# Patient Record
Sex: Female | Born: 1965 | Race: Black or African American | Hispanic: No | Marital: Married | Smoking: Former smoker
Health system: Southern US, Community
[De-identification: ages and names within clinical notes are randomized; demographics above are authoritative.]

## PROBLEM LIST (undated history)

## (undated) DIAGNOSIS — I10 Essential (primary) hypertension: Secondary | ICD-10-CM

---

## 2008-12-27 ENCOUNTER — Emergency Department: Payer: Self-pay | Admitting: Emergency Medicine

## 2009-12-17 ENCOUNTER — Emergency Department: Payer: Self-pay | Admitting: Emergency Medicine

## 2009-12-18 ENCOUNTER — Ambulatory Visit: Payer: Self-pay | Admitting: Unknown Physician Specialty

## 2010-04-05 ENCOUNTER — Emergency Department: Payer: Self-pay | Admitting: Emergency Medicine

## 2010-10-25 ENCOUNTER — Emergency Department: Payer: Self-pay | Admitting: *Deleted

## 2014-05-28 HISTORY — PX: TOOTH EXTRACTION: SUR596

## 2014-08-21 ENCOUNTER — Encounter: Payer: Self-pay | Admitting: Family Medicine

## 2014-08-21 ENCOUNTER — Other Ambulatory Visit (HOSPITAL_COMMUNITY)
Admission: RE | Admit: 2014-08-21 | Discharge: 2014-08-21 | Disposition: A | Discharge status: Home / Self Care | Payer: Commercial Managed Care - PPO | Source: Ambulatory Visit | Attending: Family Medicine | Admitting: Family Medicine

## 2014-08-21 ENCOUNTER — Ambulatory Visit (INDEPENDENT_AMBULATORY_CARE_PROVIDER_SITE_OTHER): Payer: Commercial Managed Care - PPO | Admitting: Family Medicine

## 2014-08-21 VITALS — BP 150/92 | HR 83 | Temp 97.5°F | Ht 65.0 in | Wt 228.5 lb

## 2014-08-21 DIAGNOSIS — Z1151 Encounter for screening for human papillomavirus (HPV): Secondary | ICD-10-CM | POA: Diagnosis present

## 2014-08-21 DIAGNOSIS — R030 Elevated blood-pressure reading, without diagnosis of hypertension: Secondary | ICD-10-CM | POA: Insufficient documentation

## 2014-08-21 DIAGNOSIS — N959 Unspecified menopausal and perimenopausal disorder: Secondary | ICD-10-CM | POA: Diagnosis not present

## 2014-08-21 DIAGNOSIS — Z0000 Encounter for general adult medical examination without abnormal findings: Secondary | ICD-10-CM

## 2014-08-21 DIAGNOSIS — Z1239 Encounter for other screening for malignant neoplasm of breast: Secondary | ICD-10-CM

## 2014-08-21 DIAGNOSIS — Z01419 Encounter for gynecological examination (general) (routine) without abnormal findings: Secondary | ICD-10-CM | POA: Diagnosis not present

## 2014-08-21 LAB — COMPREHENSIVE METABOLIC PANEL
ALT: 47 U/L — ABNORMAL HIGH (ref 0–35)
AST: 166 U/L — ABNORMAL HIGH (ref 0–37)
Albumin: 4.1 g/dL (ref 3.5–5.2)
Alkaline Phosphatase: 100 U/L (ref 39–117)
BUN: 10 mg/dL (ref 6–23)
CHLORIDE: 101 meq/L (ref 96–112)
CO2: 28 mEq/L (ref 19–32)
Calcium: 9.4 mg/dL (ref 8.4–10.5)
Creatinine, Ser: 0.62 mg/dL (ref 0.40–1.20)
GFR: 132.43 mL/min (ref >60.00)
Glucose, Bld: 138 mg/dL — ABNORMAL HIGH (ref 70–99)
Potassium: 3.8 mEq/L (ref 3.5–5.1)
Sodium: 141 mEq/L (ref 135–145)
TOTAL PROTEIN: 8.4 g/dL — AB (ref 6.0–8.3)
Total Bilirubin: 0.5 mg/dL (ref 0.2–1.2)

## 2014-08-21 LAB — CBC WITH DIFFERENTIAL/PLATELET
BASOS ABS: 0 10*3/uL (ref 0.0–0.1)
Basophils Relative: 0.2 % (ref 0.0–3.0)
Eosinophils Absolute: 0.1 10*3/uL (ref 0.0–0.7)
Eosinophils Relative: 0.8 % (ref 0.0–5.0)
HCT: 38.9 % (ref 36.0–46.0)
HEMOGLOBIN: 12.1 g/dL (ref 12.0–15.0)
Lymphocytes Relative: 24.4 % (ref 12.0–46.0)
Lymphs Abs: 2.6 10*3/uL (ref 0.7–4.0)
MCHC: 31.2 g/dL (ref 30.0–36.0)
MCV: 81.1 fl (ref 78.0–100.0)
MONO ABS: 0.4 10*3/uL (ref 0.1–1.0)
Monocytes Relative: 3.5 % (ref 3.0–12.0)
Neutro Abs: 7.7 10*3/uL (ref 1.4–7.7)
Neutrophils Relative %: 71.1 % (ref 43.0–77.0)
PLATELETS: 212 10*3/uL (ref 150.0–400.0)
RBC: 4.8 Mil/uL (ref 3.87–5.11)
RDW: 17.8 % — ABNORMAL HIGH (ref 11.5–15.5)
WBC: 10.8 10*3/uL — AB (ref 4.0–10.5)

## 2014-08-21 LAB — TSH: TSH: 2.2 u[IU]/mL (ref 0.35–4.50)

## 2014-08-21 LAB — LIPID PANEL
Cholesterol: 234 mg/dL — ABNORMAL HIGH (ref 0–200)
HDL: 52.6 mg/dL (ref >39.00)
LDL Cholesterol: 141 mg/dL — ABNORMAL HIGH (ref 0–99)
NONHDL: 181.4
Total CHOL/HDL Ratio: 4
Triglycerides: 200 mg/dL — ABNORMAL HIGH (ref 0.0–149.0)
VLDL: 40 mg/dL (ref 0.0–40.0)

## 2014-08-21 MED ORDER — ESCITALOPRAM OXALATE 5 MG PO TABS: 5.0000 mg | ORAL_TABLET | Freq: Every day | ORAL | Status: DC

## 2014-08-21 NOTE — Assessment & Plan Note (Signed)
Reviewed preventive care protocols, scheduled due services, and updated immunizations Discussed nutrition, exercise, diet, and healthy lifestyle.  Pap smear today.  Mammogram ordered and phone number for [facility] given to pt so that she can schedule a mammogram.  Orders Placed This Encounter  Procedures  . MM Digital Screening  . CBC with Differential/Platelet  . Comprehensive metabolic panel  . Lipid panel  . TSH

## 2014-08-21 NOTE — Progress Notes (Signed)
Subjective:   Patient ID: Mackenzie Little, female    DOB: Mar 30, 1965, 49 y.o.   MRN: [id number]  Mackenzie Little is a pleasant 49 y.o. year old female who presents to clinic today with [place] and Annual Exam  on 08/21/2014  HPI:  G3P3.  No h/o abnormal pap smears in last 5 years. Over 1 year since LMP.  Having hot flashes, mood swings, difficulty sleeping.   Not sure when she last had mammogram.  Has not been to see a doctor in years.   Elevated BP-blood pressure elevated today.  No personal or family history of HTN. No HA, blurred vision, CP or SOB.   No current outpatient prescriptions on file prior to visit.   No current facility-administered medications on file prior to visit.    No Known Allergies  History reviewed. No pertinent past medical history.  Past Surgical History  Procedure Laterality Date  . Tooth extraction  May 2016    History reviewed. No pertinent family history.  History   Social History  . Marital Status: Married    Spouse Name: N/A  . Number of Children: N/A  . Years of Education: N/A   Occupational History  . Not on file.   Social History Main Topics  . Smoking status: Former Research scientist (life sciences)  . Smokeless tobacco: Never Used  . Alcohol Use: Yes  . Drug Use: No  . Sexual Activity: Yes   Other Topics Concern  . Not on file   Social History Narrative  . No narrative on file   The PMH, PSH, Social History, Family History, Medications, and allergies have been reviewed in [facility], and have been updated if relevant.   Review of Systems  Constitutional: Negative.   HENT: Negative.   Eyes: Negative.   Respiratory: Negative.   Cardiovascular: Negative.   Gastrointestinal: Negative.   Endocrine: Positive for heat intolerance.  Genitourinary: Positive for menstrual problem.  Musculoskeletal: Negative.   Skin: Negative.   Allergic/Immunologic: Negative.   Neurological: Negative.   Hematological: Negative.   Psychiatric/Behavioral:  Positive for sleep disturbance, dysphoric mood and decreased concentration. Negative for self-injury and agitation. The patient is not nervous/anxious and is not hyperactive.   All other systems reviewed and are negative.      Objective:    BP 150/92 mmHg  Pulse 83  Temp(Src) 97.5 F (36.4 C) (Oral)  Ht 5\' 5"  (1.651 m)  Wt 228 lb 8 oz (103.647 kg)  BMI 38.02 kg/m2  SpO2 95%  LMP 12/26/2013   Physical Exam   General:  Well-developed,well-nourished,in no acute distress; alert,appropriate and cooperative throughout examination Head:  normocephalic and atraumatic.   Eyes:  vision grossly intact, pupils equal, pupils round, and pupils reactive to light.   Ears:  R ear normal and L ear normal.   Nose:  no external deformity.   Mouth:  good dentition.   Neck:  No deformities, masses, or tenderness noted. Breasts:  No mass, nodules, thickening, tenderness, bulging, retraction, inflamation, nipple discharge or skin changes noted.   Lungs:  Normal respiratory effort, chest expands symmetrically. Lungs are clear to auscultation, no crackles or wheezes. Heart:  Normal rate and regular rhythm. S1 and S2 normal without gallop, murmur, click, rub or other extra sounds. Abdomen:  Bowel sounds positive,abdomen soft and non-tender without masses, organomegaly or hernias noted. Rectal:  no external abnormalities.   Genitalia:  Pelvic Exam:        External: normal female genitalia without lesions or masses  Vagina: normal without lesions or masses        Cervix: normal without lesions or masses        Adnexa: normal bimanual exam without masses or fullness        Uterus: normal by palpation        Pap smear: performed Msk:  No deformity or scoliosis noted of thoracic or lumbar spine.   Extremities:  No clubbing, cyanosis, edema, or deformity noted with normal full range of motion of all joints.   Neurologic:  alert & oriented X3 and gait normal.   Skin:  Intact without suspicious lesions  or rashes Cervical Nodes:  No lymphadenopathy noted Axillary Nodes:  No palpable lymphadenopathy Psych:  Cognition and judgment appear intact. Alert and cooperative with normal attention span and concentration. No apparent delusions, illusions, hallucinations       Assessment & Plan:   Well woman exam with routine gynecological exam No Follow-up on file.

## 2014-08-21 NOTE — Assessment & Plan Note (Signed)
New- discussed tx options. She is interested in trial of SSRI. eRx sent for Lexapro 5 mg daily. She will call me in 1 month with an update. The patient indicates understanding of these issues and agrees with the plan.

## 2014-08-21 NOTE — Assessment & Plan Note (Signed)
New- asymptomatic. Works at a [employer].  Advised her to have BP checked by RN at work several times and to call me with readings. The patient indicates understanding of these issues and agrees with the plan.

## 2014-08-21 NOTE — Addendum Note (Signed)
Addended by: Modena Nunnery on: 08/21/2014 11:28 AM   Modules accepted: Orders

## 2014-08-21 NOTE — Progress Notes (Signed)
Pre visit review using our clinic review tool, if applicable. No additional management support is needed unless otherwise documented below in the visit note. 

## 2014-08-21 NOTE — Patient Instructions (Addendum)
Great to see you. Please check your blood pressure at [facility] and call me.  Please call [facility] to schedule your mammogram.  We are starting lexapro 5 mg daily- please call me in 4 weeks with an update.

## 2014-08-22 LAB — CYTOLOGY - PAP

## 2015-04-30 ENCOUNTER — Telehealth: Payer: Self-pay | Admitting: Family Medicine

## 2015-04-30 NOTE — Telephone Encounter (Signed)
[place]  Call Center  Patient Name: Mackenzie Little  DOB: 07-16-1965    Initial Comment Caller states BP was 177/109. Was taken to UC and BP 181/111 - Primary number is contact number. system will not let me change   Nurse Assessment  Nurse: Justine Null, RN, Rodena Piety Date/Time Eilene Ghazi Time): 04/30/2015 4:58:47 PM  Confirm and document reason for call. If symptomatic, describe symptoms. You must click the next button to save text entered. ---Caller states BP was 177/109. Was taken to UC and BP 181/111 - Primary number is contact number. system will not let me change caller stated that she has fine this weekend and has some dizziness on her way to work and has been having dizziness and has been like she was going to pass out and has been taken to the UC this AM caller stated that she has been feeling better at present and her BP is 168/86 at present and has no further dizziness  Has the patient traveled out of the country within the last 30 days? ---No  Does the patient have any new or worsening symptoms? ---Yes  Will a triage be completed? ---Yes  Related visit to physician within the last 2 weeks? ---No  Does the PT have any chronic conditions? (i.e. diabetes, asthma, etc.) ---No  Is the patient pregnant or possibly pregnant? (Ask all females between the ages of 43-55) ---No  Is this a behavioral health or substance abuse call? ---No     Guidelines    Guideline Title Affirmed Question Affirmed Notes  High Blood Pressure BP ? 160/100    Final Disposition User   See PCP When Office is Open (within 3 days) Justine Null, RN, Rodena Piety    Referrals  REFERRED TO PCP OFFICE   Disagree/Comply: Leta Baptist

## 2015-05-01 NOTE — Telephone Encounter (Signed)
I called her primary #( [phone]) ,and lmom for pt to return call to office. She was not avail at [phone] the secondary # left with team health.

## 2015-05-01 NOTE — Telephone Encounter (Signed)
Please try to reach pt again today--

## 2015-05-01 NOTE — Telephone Encounter (Signed)
Per Morey Hummingbird, pt was contacted and scheduled with Dr Deborra Medina 4/5

## 2015-05-01 NOTE — Telephone Encounter (Signed)
Unable to reach pt by phone.

## 2015-05-02 ENCOUNTER — Ambulatory Visit (INDEPENDENT_AMBULATORY_CARE_PROVIDER_SITE_OTHER): Payer: Commercial Managed Care - PPO | Admitting: Family Medicine

## 2015-05-02 ENCOUNTER — Encounter: Payer: Self-pay | Admitting: Family Medicine

## 2015-05-02 VITALS — BP 152/78 | HR 94 | Temp 98.0°F | Wt 205.2 lb

## 2015-05-02 DIAGNOSIS — F411 Generalized anxiety disorder: Secondary | ICD-10-CM

## 2015-05-02 DIAGNOSIS — R030 Elevated blood-pressure reading, without diagnosis of hypertension: Secondary | ICD-10-CM | POA: Diagnosis not present

## 2015-05-02 LAB — CBC WITH DIFFERENTIAL/PLATELET
BASOS ABS: 0 10*3/uL (ref 0.0–0.1)
BASOS PCT: 0.5 % (ref 0.0–3.0)
EOS ABS: 0.1 10*3/uL (ref 0.0–0.7)
EOS PCT: 0.9 % (ref 0.0–5.0)
HEMATOCRIT: 33.3 % — AB (ref 36.0–46.0)
HEMOGLOBIN: 10.4 g/dL — AB (ref 12.0–15.0)
LYMPHS ABS: 2.2 10*3/uL (ref 0.7–4.0)
Lymphocytes Relative: 25.7 % (ref 12.0–46.0)
MCHC: 31.4 g/dL (ref 30.0–36.0)
MCV: 79.9 fl (ref 78.0–100.0)
MONO ABS: 0.3 10*3/uL (ref 0.1–1.0)
Monocytes Relative: 3.2 % (ref 3.0–12.0)
NEUTROS ABS: 5.8 10*3/uL (ref 1.4–7.7)
Neutrophils Relative %: 69.7 % (ref 43.0–77.0)
Platelets: 186 10*3/uL (ref 150.0–400.0)
RBC: 4.16 Mil/uL (ref 3.87–5.11)
RDW: 17 % — ABNORMAL HIGH (ref 11.5–15.5)
WBC: 8.4 10*3/uL (ref 4.0–10.5)

## 2015-05-02 LAB — COMPREHENSIVE METABOLIC PANEL
ALT: 29 U/L (ref 0–35)
AST: 82 U/L — AB (ref 0–37)
Albumin: 3.8 g/dL (ref 3.5–5.2)
Alkaline Phosphatase: 106 U/L (ref 39–117)
BILIRUBIN TOTAL: 1.3 mg/dL — AB (ref 0.2–1.2)
BUN: 11 mg/dL (ref 6–23)
CALCIUM: 9.4 mg/dL (ref 8.4–10.5)
CHLORIDE: 97 meq/L (ref 96–112)
CO2: 30 meq/L (ref 19–32)
CREATININE: 0.99 mg/dL (ref 0.40–1.20)
GFR: 76.94 mL/min (ref >60.00)
Glucose, Bld: 96 mg/dL (ref 70–99)
Potassium: 4 mEq/L (ref 3.5–5.1)
SODIUM: 138 meq/L (ref 135–145)
Total Protein: 8.5 g/dL — ABNORMAL HIGH (ref 6.0–8.3)

## 2015-05-02 LAB — TSH: TSH: 3.46 u[IU]/mL (ref 0.35–4.50)

## 2015-05-02 MED ORDER — SERTRALINE HCL 25 MG PO TABS: 25.0000 mg | ORAL_TABLET | Freq: Every day | ORAL | Status: DC

## 2015-05-02 MED ORDER — HYDROCHLOROTHIAZIDE 12.5 MG PO CAPS: 12.5000 mg | ORAL_CAPSULE | Freq: Every day | ORAL | Status: DC

## 2015-05-02 NOTE — Progress Notes (Signed)
Pre visit review using our clinic review tool, if applicable. No additional management support is needed unless otherwise documented below in the visit note. 

## 2015-05-02 NOTE — Addendum Note (Signed)
Addended by: Daralene Milch C on: 05/02/2015 10:40 AM   Modules accepted: SmartSet

## 2015-05-02 NOTE — Assessment & Plan Note (Signed)
Persistently elevated. Start HCTZ 12.5 mg daily. Check labs. Follow up in 2 weeks. Orders Placed This Encounter  Procedures  . Comprehensive metabolic panel  . TSH  . CBC with Differential/Platelet

## 2015-05-02 NOTE — Patient Instructions (Signed)
Good to see you. We are starting HCTZ 12.5 mg daily for high blood pressure and zoloft 25 mg daily for anxiety.  Please come see me in 2 weeks.

## 2015-05-02 NOTE — Progress Notes (Signed)
   Subjective:   Patient ID: Mackenzie Little, female    DOB: 09/14/1965, 50 y.o.   MRN: [id number]  Mackenzie Little is a pleasant 50 y.o. year old female who presents to clinic today with Hypertension  on 05/02/2015  HPI:  HTN- felt like her BP was elevated at work- works at [employer]. Checked 3 times last week and was elevated in [id number] systolic. No HA, blurred vision, CP or SOB.  Lab Results  Component Value Date   CREATININE 0.62 08/21/2014   Anxiety- deteriorated over past several weeks.  More stressors at home. Lexapro made her nauseated. Denies SI or HI.  No current outpatient prescriptions on file prior to visit.   No current facility-administered medications on file prior to visit.    No Known Allergies  No past medical history on file.  Past Surgical History  Procedure Laterality Date  . Tooth extraction  May 2016    No family history on file.  Social History   Social History  . Marital Status: Married    Spouse Name: N/A  . Number of Children: N/A  . Years of Education: N/A   Occupational History  . Not on file.   Social History Main Topics  . Smoking status: Former Research scientist (life sciences)  . Smokeless tobacco: Never Used  . Alcohol Use: Yes  . Drug Use: No  . Sexual Activity: Yes   Other Topics Concern  . Not on file   Social History Narrative   The PMH, PSH, Social History, Family History, Medications, and allergies have been reviewed in [facility], and have been updated if relevant.   Review of Systems  Constitutional: Negative.   HENT: Negative.   Respiratory: Positive for shortness of breath.   Cardiovascular: Positive for chest pain.  Gastrointestinal: Negative.   Endocrine: Negative.   Genitourinary: Negative.   Musculoskeletal: Negative.   Skin: Negative.   Neurological: Negative.   Psychiatric/Behavioral: Positive for dysphoric mood. Negative for suicidal ideas, hallucinations, behavioral problems, confusion, sleep disturbance, self-injury, decreased  concentration and agitation. The patient is nervous/anxious. The patient is not hyperactive.   All other systems reviewed and are negative.      Objective:    BP 152/78 mmHg  Pulse 94  Temp(Src) 98 F (36.7 C) (Oral)  Wt 205 lb 4 oz (93.101 kg)  SpO2 98%  BP Readings from Last 3 Encounters:  05/02/15 152/78  08/21/14 150/92     Physical Exam  Constitutional: She appears well-developed and well-nourished. No distress.  HENT:  Head: Normocephalic.  Eyes: Conjunctivae are normal.  Cardiovascular: Normal rate and regular rhythm.   Pulmonary/Chest: Effort normal and breath sounds normal.  Musculoskeletal: Normal range of motion. She exhibits no edema.  Skin: Skin is warm and dry. She is not diaphoretic.  Psychiatric:  Appears anxious today  Nursing note and vitals reviewed.         Assessment & Plan:   Elevated blood pressure (not hypertension) - Plan: Comprehensive metabolic panel, TSH, CBC with Differential/Platelet  Generalized anxiety disorder No Follow-up on file.

## 2015-05-02 NOTE — Assessment & Plan Note (Signed)
Deteriorated. Start zoloft 25 mg daily. She will follow up in 2 weeks. The patient indicates understanding of these issues and agrees with the plan.

## 2015-05-03 ENCOUNTER — Telehealth: Payer: Self-pay | Admitting: Family Medicine

## 2015-05-03 NOTE — Telephone Encounter (Signed)
Let message asking pt to call office Paperwork is ready for pick up Have not faxed   Copy for pt Copy for scan Copy for file

## 2015-05-03 NOTE — Telephone Encounter (Signed)
fmla paperwork In Dr Deborra Medina IN BOX To be filled out

## 2015-05-03 NOTE — Telephone Encounter (Signed)
Form completed and in my box.

## 2015-05-03 NOTE — Telephone Encounter (Signed)
Pt aware paperwork ready for pick up  She also is aware that she will need to turn paperwork in

## 2015-05-21 ENCOUNTER — Ambulatory Visit: Payer: Commercial Managed Care - PPO | Admitting: Family Medicine

## 2015-05-21 DIAGNOSIS — Z0289 Encounter for other administrative examinations: Secondary | ICD-10-CM

## 2015-05-22 ENCOUNTER — Telehealth: Payer: Self-pay | Admitting: Family Medicine

## 2015-05-22 NOTE — Telephone Encounter (Signed)
Yes she does need to reschedule.

## 2015-05-22 NOTE — Telephone Encounter (Signed)
Patient did not come for their scheduled appointment 4/24 2 week follow up.  Please let me know if the patient needs to be contacted immediately for follow up or if no follow up is necessary.

## 2015-05-22 NOTE — Telephone Encounter (Signed)
Left message asking pt to call the office

## 2015-05-24 NOTE — Telephone Encounter (Signed)
Left message asking pt to call office  °

## 2015-05-28 ENCOUNTER — Other Ambulatory Visit: Payer: Self-pay | Admitting: Family Medicine

## 2015-05-29 ENCOUNTER — Encounter: Payer: Self-pay | Admitting: Family Medicine

## 2015-05-29 NOTE — Telephone Encounter (Signed)
Left message asking pt to call office Sent no show letter

## 2015-06-04 ENCOUNTER — Encounter: Payer: Self-pay | Admitting: Family Medicine

## 2015-06-04 ENCOUNTER — Ambulatory Visit (INDEPENDENT_AMBULATORY_CARE_PROVIDER_SITE_OTHER): Payer: Commercial Managed Care - PPO | Admitting: Family Medicine

## 2015-06-04 VITALS — BP 136/82 | HR 84 | Temp 98.2°F | Wt 207.5 lb

## 2015-06-04 DIAGNOSIS — R030 Elevated blood-pressure reading, without diagnosis of hypertension: Secondary | ICD-10-CM | POA: Diagnosis not present

## 2015-06-04 DIAGNOSIS — F411 Generalized anxiety disorder: Secondary | ICD-10-CM | POA: Diagnosis not present

## 2015-06-04 DIAGNOSIS — D649 Anemia, unspecified: Secondary | ICD-10-CM | POA: Diagnosis not present

## 2015-06-04 MED ORDER — SERTRALINE HCL 25 MG PO TABS: ORAL_TABLET | ORAL | Status: DC

## 2015-06-04 MED ORDER — HYDROCHLOROTHIAZIDE 12.5 MG PO CAPS: ORAL_CAPSULE | ORAL | Status: DC

## 2015-06-04 NOTE — Progress Notes (Signed)
Subjective:   Patient ID: Mackenzie Little, female    DOB: Feb 20, 1965, 50 y.o.   MRN: [id number]  Mackenzie Little is a pleasant 50 y.o. year old female who presents to clinic today with Follow-up  on 06/04/2015  HPI:  HTN- saw her on 05/02/15 for elevated blood pressure and anxiety.  Note reviewed again today.  Started her on HCTZ 12.5 mg daily along with zoloft 25 mg daily.  Feels "so much better."  Anxiety improved and BP at work has been normal.  We also checked CMET, CBC and TSH which showed that she is anemic.  Anemia- with increased RDW.  She is no longer having regular periods.  Denies blood in her stool. She has felt more tired lately. CBC    Component Value Date/Time   WBC 8.4 05/02/2015 0940   RBC 4.16 05/02/2015 0940   HGB 10.4* 05/02/2015 0940   HCT 33.3* 05/02/2015 0940   PLT 186.0 05/02/2015 0940   MCV 79.9 05/02/2015 0940   MCHC 31.4 05/02/2015 0940   RDW 17.0* 05/02/2015 0940   LYMPHSABS 2.2 05/02/2015 0940   MONOABS 0.3 05/02/2015 0940   EOSABS 0.1 05/02/2015 0940   BASOSABS 0.0 05/02/2015 0940    Current Outpatient Prescriptions on File Prior to Visit  Medication Sig Dispense Refill  . hydrochlorothiazide (MICROZIDE) 12.5 MG capsule TAKE 1 CAPSULE (12.5 MG TOTAL) BY MOUTH DAILY. 30 capsule 11  . sertraline (ZOLOFT) 25 MG tablet TAKE 1 TABLET (25 MG TOTAL) BY MOUTH DAILY. 30 tablet 11   No current facility-administered medications on file prior to visit.    No Known Allergies  No past medical history on file.  Past Surgical History  Procedure Laterality Date  . Tooth extraction  May 2016    No family history on file.  Social History   Social History  . Marital Status: Married    Spouse Name: N/A  . Number of Children: N/A  . Years of Education: N/A   Occupational History  . Not on file.   Social History Main Topics  . Smoking status: Former Research scientist (life sciences)  . Smokeless tobacco: Never Used  . Alcohol Use: Yes  . Drug Use: No  . Sexual  Activity: Yes   Other Topics Concern  . Not on file   Social History Narrative     Review of Systems  Constitutional: Positive for fatigue. Negative for fever.  Respiratory: Negative.   Cardiovascular: Negative.   Gastrointestinal: Negative.   Endocrine: Negative.   Skin: Negative.   Neurological: Negative.   Psychiatric/Behavioral: Negative for suicidal ideas, sleep disturbance and self-injury. The patient is not nervous/anxious.   All other systems reviewed and are negative.      Objective:    BP 136/82 mmHg  Pulse 84  Temp(Src) 98.2 F (36.8 C) (Oral)  Wt 207 lb 8 oz (94.121 kg)  SpO2 100%   Physical Exam  Constitutional: She is oriented to person, place, and time. She appears well-developed and well-nourished. No distress.  HENT:  Head: Normocephalic.  Eyes: Conjunctivae are normal.  Neck: Normal range of motion.  Cardiovascular: Normal rate.   Pulmonary/Chest: Effort normal.  Musculoskeletal: Normal range of motion.  Neurological: She is alert and oriented to person, place, and time. No cranial nerve deficit.  Skin: Skin is warm and dry. She is not diaphoretic.  Psychiatric: She has a normal mood and affect. Her behavior is normal. Judgment and thought content normal.  Nursing note and vitals reviewed.  Assessment & Plan:   Generalized anxiety disorder  Elevated blood pressure (not hypertension) No Follow-up on file.

## 2015-06-04 NOTE — Assessment & Plan Note (Signed)
New- denies blood in stool and no longer having true periods. Start with further anemia blood work today and consider GI work up after labs resulted. The patient indicates understanding of these issues and agrees with the plan. Orders Placed This Encounter  Procedures  . CBC with Differential/Platelet  . Ferritin  . Vitamin B12  . Folate

## 2015-06-04 NOTE — Assessment & Plan Note (Signed)
Well controlled on current dose of zoloft. No changes made today. eRx refills sent today.

## 2015-06-04 NOTE — Assessment & Plan Note (Signed)
Well controlled on low dose HCTZ. eRx refills sent today.

## 2015-06-04 NOTE — Patient Instructions (Signed)
Great to see you.  We will call you with your lab results. 

## 2015-06-04 NOTE — Progress Notes (Signed)
Pre visit review using our clinic review tool, if applicable. No additional management support is needed unless otherwise documented below in the visit note. 

## 2015-06-05 ENCOUNTER — Other Ambulatory Visit: Payer: Self-pay | Admitting: Family Medicine

## 2015-06-05 DIAGNOSIS — D649 Anemia, unspecified: Secondary | ICD-10-CM

## 2015-06-05 LAB — CBC WITH DIFFERENTIAL/PLATELET
BASOS ABS: 0 10*3/uL (ref 0.0–0.1)
Basophils Relative: 0.4 % (ref 0.0–3.0)
Eosinophils Absolute: 0.1 10*3/uL (ref 0.0–0.7)
Eosinophils Relative: 0.9 % (ref 0.0–5.0)
HCT: 31 % — ABNORMAL LOW (ref 36.0–46.0)
Hemoglobin: 9.7 g/dL — ABNORMAL LOW (ref 12.0–15.0)
LYMPHS ABS: 2.2 10*3/uL (ref 0.7–4.0)
Lymphocytes Relative: 22.1 % (ref 12.0–46.0)
MCHC: 31.3 g/dL (ref 30.0–36.0)
MCV: 80.8 fl (ref 78.0–100.0)
MONO ABS: 0.3 10*3/uL (ref 0.1–1.0)
Monocytes Relative: 2.6 % — ABNORMAL LOW (ref 3.0–12.0)
NEUTROS ABS: 7.3 10*3/uL (ref 1.4–7.7)
NEUTROS PCT: 74 % (ref 43.0–77.0)
PLATELETS: 362 10*3/uL (ref 150.0–400.0)
RBC: 3.84 Mil/uL — AB (ref 3.87–5.11)
RDW: 17 % — ABNORMAL HIGH (ref 11.5–15.5)
WBC: 9.9 10*3/uL (ref 4.0–10.5)

## 2015-06-05 LAB — FERRITIN: FERRITIN: 328.1 ng/mL — AB (ref 10.0–291.0)

## 2015-06-05 LAB — VITAMIN B12: Vitamin B-12: 321 pg/mL (ref 211–911)

## 2015-06-05 LAB — FOLATE: FOLATE: 6.9 ng/mL (ref >5.9)

## 2015-08-16 ENCOUNTER — Encounter: Payer: Self-pay | Admitting: Family Medicine

## 2015-10-17 ENCOUNTER — Ambulatory Visit (INDEPENDENT_AMBULATORY_CARE_PROVIDER_SITE_OTHER): Payer: Commercial Managed Care - PPO | Admitting: Family Medicine

## 2015-10-17 ENCOUNTER — Encounter: Payer: Self-pay | Admitting: Family Medicine

## 2015-10-17 VITALS — BP 134/82 | HR 96 | Temp 98.2°F | Ht 64.25 in | Wt 203.0 lb

## 2015-10-17 DIAGNOSIS — Z0000 Encounter for general adult medical examination without abnormal findings: Secondary | ICD-10-CM | POA: Diagnosis not present

## 2015-10-17 DIAGNOSIS — R030 Elevated blood-pressure reading, without diagnosis of hypertension: Secondary | ICD-10-CM | POA: Diagnosis not present

## 2015-10-17 DIAGNOSIS — Z01419 Encounter for gynecological examination (general) (routine) without abnormal findings: Secondary | ICD-10-CM | POA: Insufficient documentation

## 2015-10-17 DIAGNOSIS — D649 Anemia, unspecified: Secondary | ICD-10-CM

## 2015-10-17 DIAGNOSIS — F411 Generalized anxiety disorder: Secondary | ICD-10-CM | POA: Diagnosis not present

## 2015-10-17 LAB — COMPREHENSIVE METABOLIC PANEL
ALBUMIN: 4.1 g/dL (ref 3.5–5.2)
ALK PHOS: 116 U/L (ref 39–117)
ALT: 61 U/L — AB (ref 0–35)
AST: 168 U/L — AB (ref 0–37)
BILIRUBIN TOTAL: 0.8 mg/dL (ref 0.2–1.2)
BUN: 16 mg/dL (ref 6–23)
CALCIUM: 9.8 mg/dL (ref 8.4–10.5)
CO2: 29 mEq/L (ref 19–32)
Chloride: 99 mEq/L (ref 96–112)
Creatinine, Ser: 0.8 mg/dL (ref 0.40–1.20)
GFR: 98.2 mL/min (ref >60.00)
Glucose, Bld: 87 mg/dL (ref 70–99)
Potassium: 4.3 mEq/L (ref 3.5–5.1)
SODIUM: 139 meq/L (ref 135–145)
TOTAL PROTEIN: 8.9 g/dL — AB (ref 6.0–8.3)

## 2015-10-17 LAB — CBC WITH DIFFERENTIAL/PLATELET
BASOS ABS: 0 10*3/uL (ref 0.0–0.1)
BASOS PCT: 0.4 % (ref 0.0–3.0)
EOS ABS: 0.1 10*3/uL (ref 0.0–0.7)
Eosinophils Relative: 1 % (ref 0.0–5.0)
HEMATOCRIT: 36.3 % (ref 36.0–46.0)
HEMOGLOBIN: 11.6 g/dL — AB (ref 12.0–15.0)
LYMPHS PCT: 28 % (ref 12.0–46.0)
Lymphs Abs: 2.5 10*3/uL (ref 0.7–4.0)
MCHC: 31.9 g/dL (ref 30.0–36.0)
MCV: 79.4 fl (ref 78.0–100.0)
MONO ABS: 0.3 10*3/uL (ref 0.1–1.0)
Monocytes Relative: 3.9 % (ref 3.0–12.0)
Neutro Abs: 5.8 10*3/uL (ref 1.4–7.7)
Neutrophils Relative %: 66.7 % (ref 43.0–77.0)
Platelets: 226 10*3/uL (ref 150.0–400.0)
RBC: 4.57 Mil/uL (ref 3.87–5.11)
RDW: 16.4 % — AB (ref 11.5–15.5)
WBC: 8.8 10*3/uL (ref 4.0–10.5)

## 2015-10-17 LAB — LIPID PANEL
CHOLESTEROL: 233 mg/dL — AB (ref 0–200)
HDL: 79.1 mg/dL (ref >39.00)
LDL CALC: 128 mg/dL — AB (ref 0–99)
NonHDL: 153.79
TRIGLYCERIDES: 130 mg/dL (ref 0.0–149.0)
Total CHOL/HDL Ratio: 3
VLDL: 26 mg/dL (ref 0.0–40.0)

## 2015-10-17 LAB — TSH: TSH: 2.03 u[IU]/mL (ref 0.35–4.50)

## 2015-10-17 NOTE — Progress Notes (Signed)
Pre visit review using our clinic review tool, if applicable. No additional management support is needed unless otherwise documented below in the visit note. 

## 2015-10-17 NOTE — Assessment & Plan Note (Signed)
Reviewed preventive care protocols, scheduled due services, and updated immunizations Discussed nutrition, exercise, diet, and healthy lifestyle.  

## 2015-10-17 NOTE — Assessment & Plan Note (Signed)
Has not yet been to been to hematology but will recheck CBC today.

## 2015-10-17 NOTE — Progress Notes (Addendum)
Subjective:   Patient ID: Mackenzie Little, female    DOB: 12/07/1965, 50 y.o.   MRN: [id number]  Mackenzie Little is a pleasant 50 y.o. year old female who presents to clinic today with Annual Exam  on 10/17/2015  HPI:  Last pap smear 08/21/14- done by me. ? mammogram  HTN- saw her on 05/02/15 for elevated blood pressure and anxiety.  Note reviewed again today.  Started her on HCTZ 12.5 mg daily along with zoloft 25 mg daily.  Feels "so much better."  Anxiety improved and BP at work has been normal.  We also checked CMET, CBC and TSH which showed that she is anemic.  Anemia- with increased RDW.  She is no longer having regular periods.  Denies blood in her stool. Referred her to hematology.  She has not been yet.  CBC    Component Value Date/Time   WBC 9.9 06/04/2015 1428   RBC 3.84 (L) 06/04/2015 1428   HGB 9.7 (L) 06/04/2015 1428   HCT 31.0 (L) 06/04/2015 1428   PLT 362.0 06/04/2015 1428   MCV 80.8 06/04/2015 1428   MCHC 31.3 06/04/2015 1428   RDW 17.0 (H) 06/04/2015 1428   LYMPHSABS 2.2 06/04/2015 1428   MONOABS 0.3 06/04/2015 1428   EOSABS 0.1 06/04/2015 1428   BASOSABS 0.0 06/04/2015 1428   Lab Results  Component Value Date   CHOL 234 (H) 08/21/2014   HDL 52.60 08/21/2014   LDLCALC 141 (H) 08/21/2014   TRIG 200.0 (H) 08/21/2014   CHOLHDL 4 08/21/2014   Lab Results  Component Value Date   CREATININE 0.99 05/02/2015   Lab Results  Component Value Date   TSH 3.46 05/02/2015    Current Outpatient Prescriptions on File Prior to Visit  Medication Sig Dispense Refill  . hydrochlorothiazide (MICROZIDE) 12.5 MG capsule TAKE 1 CAPSULE (12.5 MG TOTAL) BY MOUTH DAILY. 30 capsule 11  . sertraline (ZOLOFT) 25 MG tablet TAKE 1 TABLET (25 MG TOTAL) BY MOUTH DAILY. 30 tablet 11   No current facility-administered medications on file prior to visit.     No Known Allergies  No past medical history on file.  Past Surgical History:  Procedure Laterality Date  .  TOOTH EXTRACTION  May 2016    No family history on file.  Social History   Social History  . Marital status: Married    Spouse name: N/A  . Number of children: N/A  . Years of education: N/A   Occupational History  . Not on file.   Social History Main Topics  . Smoking status: Former Research scientist (life sciences)  . Smokeless tobacco: Never Used  . Alcohol use Yes  . Drug use: No  . Sexual activity: Yes   Other Topics Concern  . Not on file   Social History Narrative  . No narrative on file     Review of Systems  Constitutional: Positive for fatigue. Negative for fever.  Respiratory: Negative.   Cardiovascular: Negative.   Gastrointestinal: Negative.   Endocrine: Negative.   Skin: Negative.   Neurological: Negative.   Psychiatric/Behavioral: Negative for self-injury, sleep disturbance and suicidal ideas. The patient is not nervous/anxious.   All other systems reviewed and are negative.      Objective:    BP 134/82   Pulse 96   Temp 98.2 F (36.8 C) (Oral)   Ht 5' 4.25" (1.632 m)   Wt 203 lb (92.1 kg)   SpO2 98%   BMI 34.57 kg/m    Physical  Exam  Constitutional: She is oriented to person, place, and time. She appears well-developed and well-nourished. No distress.  HENT:  Head: Normocephalic.  Eyes: Conjunctivae are normal.  Neck: Normal range of motion.  Cardiovascular: Normal rate.   Pulmonary/Chest: Effort normal.  Musculoskeletal: Normal range of motion.  Neurological: She is alert and oriented to person, place, and time. No cranial nerve deficit.  Skin: Skin is warm and dry. She is not diaphoretic.  Psychiatric: She has a normal mood and affect. Her behavior is normal. Judgment and thought content normal.  Nursing note and vitals reviewed.         Assessment & Plan:   Well woman exam - Plan: Comprehensive metabolic panel, Lipid panel, TSH  Generalized anxiety disorder  Elevated blood pressure (not hypertension)  Anemia, unspecified anemia type - Plan:  CBC with Differential/Platelet No Follow-up on file.

## 2015-10-17 NOTE — Patient Instructions (Signed)
Great to see you. We will call you with your lab results or you can see them online.  Please schedule your appt for hematology and for a mammogram.

## 2015-10-17 NOTE — Assessment & Plan Note (Signed)
Well controlled.  No changes made. 

## 2015-10-17 NOTE — Assessment & Plan Note (Signed)
Well controlled on zoloft. No changes made.

## 2015-11-29 ENCOUNTER — Emergency Department
Admission: EM | Admit: 2015-11-29 | Discharge: 2015-11-29 | Disposition: A | Discharge status: Home / Self Care | Payer: Commercial Managed Care - PPO | Attending: Emergency Medicine | Admitting: Emergency Medicine

## 2015-11-29 ENCOUNTER — Emergency Department: Payer: Commercial Managed Care - PPO

## 2015-11-29 DIAGNOSIS — D649 Anemia, unspecified: Secondary | ICD-10-CM | POA: Insufficient documentation

## 2015-11-29 DIAGNOSIS — I10 Essential (primary) hypertension: Secondary | ICD-10-CM | POA: Diagnosis not present

## 2015-11-29 DIAGNOSIS — Z87891 Personal history of nicotine dependence: Secondary | ICD-10-CM | POA: Insufficient documentation

## 2015-11-29 DIAGNOSIS — Z79899 Other long term (current) drug therapy: Secondary | ICD-10-CM | POA: Insufficient documentation

## 2015-11-29 DIAGNOSIS — M25562 Pain in left knee: Secondary | ICD-10-CM | POA: Diagnosis present

## 2015-11-29 DIAGNOSIS — M25462 Effusion, left knee: Secondary | ICD-10-CM | POA: Diagnosis not present

## 2015-11-29 DIAGNOSIS — M1712 Unilateral primary osteoarthritis, left knee: Secondary | ICD-10-CM | POA: Insufficient documentation

## 2015-11-29 HISTORY — DX: Essential (primary) hypertension: I10

## 2015-11-29 MED ORDER — NAPROXEN 500 MG PO TABS: 500.0000 mg | ORAL_TABLET | Freq: Two times a day (BID) | ORAL | 0 refills | Status: DC

## 2015-11-29 MED ORDER — TRAMADOL HCL 50 MG PO TABS: 50.0000 mg | ORAL_TABLET | Freq: Two times a day (BID) | ORAL | 0 refills | Status: DC | PRN

## 2015-11-29 NOTE — ED Provider Notes (Signed)
[facility] Emergency Department Provider Note   ____________________________________________   First MD Initiated Contact with Patient 11/29/15 1350     (approximate)  I have reviewed the triage vital signs and the nursing notes.   HISTORY  Chief Complaint Knee Pain and Joint Swelling    HPI Mackenzie Little is a 50 y.o. female patient complaining left knee pain and edema for 2 days. Patient state history of any injury greater than 5 years ago. Patient stated pain increases with standing. Patient states she has tried different footwear to relieve her knee pain secondary to a job which requires prolonged standing. No other palliative measures taken for this complaint. Patient rates her pain as a 9/10. Patient describes pain as "achy".  Past Medical History:  Diagnosis Date  . Hypertension     Patient Active Problem List   Diagnosis Date Noted  . Well woman exam 10/17/2015  . Anemia 06/04/2015  . Generalized anxiety disorder 05/02/2015  . Elevated blood pressure (not hypertension) 08/21/2014  . Menopausal disorder 08/21/2014    Past Surgical History:  Procedure Laterality Date  . TOOTH EXTRACTION  May 2016    Prior to Admission medications   Medication Sig Start Date End Date Taking? Authorizing Provider  hydrochlorothiazide (MICROZIDE) 12.5 MG capsule TAKE 1 CAPSULE (12.5 MG TOTAL) BY MOUTH DAILY. 06/04/15   Lucille Passy, MD  naproxen (NAPROSYN) 500 MG tablet Take 1 tablet (500 mg total) by mouth 2 (two) times daily with a meal. 11/29/15   Sable Feil, PA-C  sertraline (ZOLOFT) 25 MG tablet TAKE 1 TABLET (25 MG TOTAL) BY MOUTH DAILY. 06/04/15   Lucille Passy, MD  traMADol (ULTRAM) 50 MG tablet Take 1 tablet (50 mg total) by mouth every 12 (twelve) hours as needed. 11/29/15   Sable Feil, PA-C    Allergies Review of patient's allergies indicates no known allergies.  No family history on file.  Social History Social History  Substance Use  Topics  . Smoking status: Former Research scientist (life sciences)  . Smokeless tobacco: Never Used  . Alcohol use Yes    Review of Systems Constitutional: No fever/chills Eyes: No visual changes. ENT: No sore throat. Cardiovascular: Denies chest pain. Respiratory: Denies shortness of breath. Gastrointestinal: No abdominal pain.  No nausea, no vomiting.  No diarrhea.  No constipation. Genitourinary: Negative for dysuria. Musculoskeletal: Negative for back pain. Skin: Negative for rash. Neurological: Negative for headaches, focal weakness or numbness. Psychiatric:Anxiety Endocrine:Hypertension Hematological/Lymphatic:Anemia  ____________________________________________   PHYSICAL EXAM:  VITAL SIGNS: ED Triage Vitals  Enc Vitals Group     BP 11/29/15 1318 (!) 120/96     Pulse Rate 11/29/15 1318 86     Resp 11/29/15 1318 18     Temp 11/29/15 1318 97.5 F (36.4 C)     Temp Source 11/29/15 1318 Oral     SpO2 11/29/15 1318 100 %     Weight 11/29/15 1319 205 lb (93 kg)     Height --      Head Circumference --      Peak Flow --      Pain Score 11/29/15 1319 9     Pain Loc --      Pain Edu? --      Excl. in [place]? --     Constitutional: Alert and oriented. Well appearing and in no acute distress. Eyes: Conjunctivae are normal. PERRL. EOMI. Head: Atraumatic. Nose: No congestion/rhinnorhea. Mouth/Throat: Mucous membranes are moist.  Oropharynx non-erythematous. Neck: No stridor.  No cervical spine tenderness to palpation. Hematological/Lymphatic/Immunilogical: No cervical lymphadenopathy. Cardiovascular: Normal rate, regular rhythm. Grossly normal heart sounds.  Good peripheral circulation. Respiratory: Normal respiratory effort.  No retractions. Lungs CTAB. Gastrointestinal: Soft and nontender. No distention. No abdominal bruits. No CVA tenderness.  Musculoskeletal: No obvious deformities to the left knee. There is mild edema. Patient is moderate crepitus palpation. Patient has full nuchal range  of motion. Patient able to weight-bear without difficulty.  Neurologic:  Normal speech and language. No gross focal neurologic deficits are appreciated. No gait instability. Skin:  Skin is warm, dry and intact. No rash noted. Psychiatric: Mood and affect are normal. Speech and behavior are normal.  ____________________________________________   LABS (all labs ordered are listed, but only abnormal results are displayed)  Labs Reviewed - No data to display ____________________________________________  EKG   ____________________________________________  RADIOLOGY  X-ray revealed moderate medial joint space narrowing. Patient also has mild to moderate arthritic changes. She has moderate joint effusion at this time. ____________________________________________   PROCEDURES  Procedure(s) performed: None  Procedures  Critical Care performed: No  ____________________________________________   INITIAL IMPRESSION / ASSESSMENT AND PLAN / ED COURSE  Pertinent labs & imaging results that were available during my care of the patient were reviewed by me and considered in my medical decision making (see chart for details).  Left knee pain secondary to degenerative changes. Patient also has moderate effusion. Discussed x-ray finding with patient. Patient given discharge care instructions. Patient advised follow orthopedics for further evaluation and treatment. Patient given a work note. Patient get a prescription for naproxen and tramadol.  Clinical Course     ____________________________________________   FINAL CLINICAL IMPRESSION(S) / ED DIAGNOSES  Final diagnoses:  Effusion of left knee  Arthritis of knee, left      NEW MEDICATIONS STARTED DURING THIS VISIT:  New Prescriptions   NAPROXEN (NAPROSYN) 500 MG TABLET    Take 1 tablet (500 mg total) by mouth 2 (two) times daily with a meal.   TRAMADOL (ULTRAM) 50 MG TABLET    Take 1 tablet (50 mg total) by mouth every 12  (twelve) hours as needed.     Note:  This document was prepared using Dragon voice recognition software and may include unintentional dictation errors.    Sable Feil, PA-C 11/29/15 [place], MD 11/30/15 [phone]

## 2015-11-29 NOTE — ED Triage Notes (Signed)
Left knee swelling and pain X 2 days. Previous hx of injury to left knee, pain worse with standing at work. Pt alert and oriented X4, active, cooperative, pt in NAD. RR even and unlabored, color WNL.

## 2017-07-06 ENCOUNTER — Inpatient Hospital Stay
Admission: EM | Admit: 2017-07-06 | Discharge: 2017-07-08 | DRG: 442 | Disposition: A | Discharge status: Home / Self Care | Payer: Self-pay | Attending: Internal Medicine | Admitting: Internal Medicine

## 2017-07-06 ENCOUNTER — Emergency Department: Payer: Self-pay

## 2017-07-06 ENCOUNTER — Inpatient Hospital Stay (HOSPITAL_COMMUNITY)
Admit: 2017-07-06 | Discharge: 2017-07-06 | Disposition: A | Discharge status: Home / Self Care | Payer: Self-pay | Attending: Internal Medicine | Admitting: Internal Medicine

## 2017-07-06 ENCOUNTER — Encounter: Payer: Self-pay | Admitting: Radiology

## 2017-07-06 ENCOUNTER — Inpatient Hospital Stay: Payer: Self-pay

## 2017-07-06 ENCOUNTER — Other Ambulatory Visit: Payer: Self-pay

## 2017-07-06 ENCOUNTER — Other Ambulatory Visit
Admission: RE | Admit: 2017-07-06 | Discharge: 2017-07-06 | Disposition: A | Discharge status: Home / Self Care | Payer: Self-pay | Source: Ambulatory Visit | Attending: Gastroenterology | Admitting: Gastroenterology

## 2017-07-06 DIAGNOSIS — I10 Essential (primary) hypertension: Secondary | ICD-10-CM | POA: Diagnosis present

## 2017-07-06 DIAGNOSIS — D122 Benign neoplasm of ascending colon: Secondary | ICD-10-CM | POA: Diagnosis present

## 2017-07-06 DIAGNOSIS — K625 Hemorrhage of anus and rectum: Secondary | ICD-10-CM | POA: Diagnosis present

## 2017-07-06 DIAGNOSIS — K760 Fatty (change of) liver, not elsewhere classified: Secondary | ICD-10-CM | POA: Diagnosis present

## 2017-07-06 DIAGNOSIS — R627 Adult failure to thrive: Secondary | ICD-10-CM | POA: Diagnosis present

## 2017-07-06 DIAGNOSIS — R630 Anorexia: Secondary | ICD-10-CM | POA: Diagnosis present

## 2017-07-06 DIAGNOSIS — K8050 Calculus of bile duct without cholangitis or cholecystitis without obstruction: Secondary | ICD-10-CM

## 2017-07-06 DIAGNOSIS — E538 Deficiency of other specified B group vitamins: Secondary | ICD-10-CM | POA: Diagnosis present

## 2017-07-06 DIAGNOSIS — D124 Benign neoplasm of descending colon: Secondary | ICD-10-CM | POA: Diagnosis present

## 2017-07-06 DIAGNOSIS — E8809 Other disorders of plasma-protein metabolism, not elsewhere classified: Secondary | ICD-10-CM | POA: Diagnosis present

## 2017-07-06 DIAGNOSIS — D638 Anemia in other chronic diseases classified elsewhere: Secondary | ICD-10-CM | POA: Diagnosis present

## 2017-07-06 DIAGNOSIS — K7030 Alcoholic cirrhosis of liver without ascites: Secondary | ICD-10-CM

## 2017-07-06 DIAGNOSIS — D125 Benign neoplasm of sigmoid colon: Secondary | ICD-10-CM | POA: Diagnosis present

## 2017-07-06 DIAGNOSIS — E876 Hypokalemia: Secondary | ICD-10-CM | POA: Diagnosis present

## 2017-07-06 DIAGNOSIS — K5909 Other constipation: Secondary | ICD-10-CM | POA: Diagnosis present

## 2017-07-06 DIAGNOSIS — R6881 Early satiety: Secondary | ICD-10-CM | POA: Diagnosis present

## 2017-07-06 DIAGNOSIS — K210 Gastro-esophageal reflux disease with esophagitis: Secondary | ICD-10-CM | POA: Diagnosis present

## 2017-07-06 DIAGNOSIS — K709 Alcoholic liver disease, unspecified: Secondary | ICD-10-CM | POA: Diagnosis present

## 2017-07-06 DIAGNOSIS — R17 Unspecified jaundice: Secondary | ICD-10-CM

## 2017-07-06 DIAGNOSIS — K8070 Calculus of gallbladder and bile duct without cholecystitis without obstruction: Secondary | ICD-10-CM | POA: Diagnosis present

## 2017-07-06 DIAGNOSIS — R109 Unspecified abdominal pain: Secondary | ICD-10-CM

## 2017-07-06 DIAGNOSIS — K3189 Other diseases of stomach and duodenum: Secondary | ICD-10-CM | POA: Diagnosis present

## 2017-07-06 DIAGNOSIS — R162 Hepatomegaly with splenomegaly, not elsewhere classified: Secondary | ICD-10-CM | POA: Diagnosis present

## 2017-07-06 DIAGNOSIS — B9681 Helicobacter pylori [H. pylori] as the cause of diseases classified elsewhere: Secondary | ICD-10-CM | POA: Diagnosis present

## 2017-07-06 DIAGNOSIS — K648 Other hemorrhoids: Secondary | ICD-10-CM | POA: Diagnosis present

## 2017-07-06 DIAGNOSIS — R011 Cardiac murmur, unspecified: Secondary | ICD-10-CM | POA: Diagnosis present

## 2017-07-06 DIAGNOSIS — A048 Other specified bacterial intestinal infections: Secondary | ICD-10-CM | POA: Insufficient documentation

## 2017-07-06 DIAGNOSIS — F1010 Alcohol abuse, uncomplicated: Secondary | ICD-10-CM | POA: Diagnosis present

## 2017-07-06 DIAGNOSIS — Z87891 Personal history of nicotine dependence: Secondary | ICD-10-CM

## 2017-07-06 DIAGNOSIS — D62 Acute posthemorrhagic anemia: Secondary | ICD-10-CM | POA: Diagnosis present

## 2017-07-06 DIAGNOSIS — K269 Duodenal ulcer, unspecified as acute or chronic, without hemorrhage or perforation: Secondary | ICD-10-CM | POA: Diagnosis present

## 2017-07-06 DIAGNOSIS — K2210 Ulcer of esophagus without bleeding: Secondary | ICD-10-CM | POA: Diagnosis present

## 2017-07-06 DIAGNOSIS — D649 Anemia, unspecified: Secondary | ICD-10-CM

## 2017-07-06 DIAGNOSIS — K766 Portal hypertension: Secondary | ICD-10-CM | POA: Diagnosis present

## 2017-07-06 DIAGNOSIS — R0789 Other chest pain: Secondary | ICD-10-CM | POA: Diagnosis present

## 2017-07-06 DIAGNOSIS — E860 Dehydration: Secondary | ICD-10-CM | POA: Diagnosis present

## 2017-07-06 DIAGNOSIS — K7460 Unspecified cirrhosis of liver: Secondary | ICD-10-CM | POA: Diagnosis present

## 2017-07-06 DIAGNOSIS — K7589 Other specified inflammatory liver diseases: Principal | ICD-10-CM | POA: Diagnosis present

## 2017-07-06 DIAGNOSIS — I5030 Unspecified diastolic (congestive) heart failure: Secondary | ICD-10-CM

## 2017-07-06 DIAGNOSIS — R079 Chest pain, unspecified: Secondary | ICD-10-CM | POA: Diagnosis present

## 2017-07-06 LAB — ECHOCARDIOGRAM COMPLETE
Height: 65 in
WEIGHTICAEL: 2907.2 [oz_av]

## 2017-07-06 LAB — CBC
HCT: 24.4 % — ABNORMAL LOW (ref 35.0–47.0)
HEMOGLOBIN: 7.5 g/dL — AB (ref 12.0–16.0)
MCH: 25 pg — AB (ref 26.0–34.0)
MCHC: 30.7 g/dL — AB (ref 32.0–36.0)
MCV: 81.3 fL (ref 80.0–100.0)
Platelets: 207 10*3/uL (ref 150–440)
RBC: 3 MIL/uL — ABNORMAL LOW (ref 3.80–5.20)
RDW: 18.3 % — ABNORMAL HIGH (ref 11.5–14.5)
WBC: 11.1 10*3/uL — ABNORMAL HIGH (ref 3.6–11.0)

## 2017-07-06 LAB — FOLATE: Folate: 5.7 ng/mL — ABNORMAL LOW (ref >5.9)

## 2017-07-06 LAB — LACTATE DEHYDROGENASE: LDH: 143 U/L (ref 98–192)

## 2017-07-06 LAB — BASIC METABOLIC PANEL
ANION GAP: 13 (ref 5–15)
BUN: <5 mg/dL — ABNORMAL LOW (ref 6–20)
CALCIUM: 8 mg/dL — AB (ref 8.9–10.3)
CO2: 23 mmol/L (ref 22–32)
Chloride: 101 mmol/L (ref 101–111)
Creatinine, Ser: 0.61 mg/dL (ref 0.44–1.00)
GFR calc Af Amer: >60 mL/min (ref >60)
GFR calc non Af Amer: >60 mL/min (ref >60)
GLUCOSE: 84 mg/dL (ref 65–99)
Potassium: 2.4 mmol/L — CL (ref 3.5–5.1)
Sodium: 137 mmol/L (ref 135–145)

## 2017-07-06 LAB — POTASSIUM
Potassium: 2.8 mmol/L — ABNORMAL LOW (ref 3.5–5.1)
Potassium: 4 mmol/L (ref 3.5–5.1)

## 2017-07-06 LAB — RETICULOCYTES
RBC.: 2.73 MIL/uL — AB (ref 3.80–5.20)
Retic Count, Absolute: 57.3 10*3/uL (ref 19.0–183.0)
Retic Ct Pct: 2.1 % (ref 0.4–3.1)

## 2017-07-06 LAB — IRON AND TIBC
IRON: 84 ug/dL (ref 28–170)
SATURATION RATIOS: 75 % — AB (ref 10.4–31.8)
TIBC: 113 ug/dL — AB (ref 250–450)
UIBC: 29 ug/dL

## 2017-07-06 LAB — HEPATIC FUNCTION PANEL
ALBUMIN: 2.2 g/dL — AB (ref 3.5–5.0)
ALT: 20 U/L (ref 14–54)
AST: 120 U/L — ABNORMAL HIGH (ref 15–41)
Alkaline Phosphatase: 142 U/L — ABNORMAL HIGH (ref 38–126)
Bilirubin, Direct: 3.5 mg/dL — ABNORMAL HIGH (ref 0.1–0.5)
Indirect Bilirubin: 2.7 mg/dL — ABNORMAL HIGH (ref 0.3–0.9)
TOTAL PROTEIN: 7.8 g/dL (ref 6.5–8.1)
Total Bilirubin: 6.2 mg/dL — ABNORMAL HIGH (ref 0.3–1.2)

## 2017-07-06 LAB — TRANSFERRIN: Transferrin: 82 mg/dL — ABNORMAL LOW (ref 192–382)

## 2017-07-06 LAB — MAGNESIUM: Magnesium: 1.5 mg/dL — ABNORMAL LOW (ref 1.7–2.4)

## 2017-07-06 LAB — ETHANOL: Alcohol, Ethyl (B): <10 mg/dL (ref <10)

## 2017-07-06 LAB — FERRITIN: FERRITIN: 515 ng/mL — AB (ref 11–307)

## 2017-07-06 LAB — TROPONIN I: Troponin I: <0.03 ng/mL (ref <0.03)

## 2017-07-06 LAB — LIPASE, BLOOD: LIPASE: 23 U/L (ref 11–51)

## 2017-07-06 MED ORDER — POTASSIUM CHLORIDE 2 MEQ/ML IV SOLN
INTRAVENOUS | Status: DC
  Administered 2017-07-06 – 2017-07-07 (×3): via INTRAVENOUS
  Filled 2017-07-06 (×11): qty 1000

## 2017-07-06 MED ORDER — THIAMINE HCL 100 MG/ML IJ SOLN: 100.0000 mg | Freq: Every day | INTRAMUSCULAR | Status: DC

## 2017-07-06 MED ORDER — MAGNESIUM SULFATE 2 GM/50ML IV SOLN
2.0000 g | Freq: Once | INTRAVENOUS | Status: DC
  Filled 2017-07-06: qty 50

## 2017-07-06 MED ORDER — POTASSIUM CHLORIDE 10 MEQ/100ML IV SOLN
10.0000 meq | INTRAVENOUS | Status: AC
  Administered 2017-07-06 (×4): 10 meq via INTRAVENOUS
  Filled 2017-07-06 (×4): qty 100

## 2017-07-06 MED ORDER — MORPHINE SULFATE (PF) 2 MG/ML IV SOLN
2.0000 mg | Freq: Once | INTRAVENOUS | Status: AC
Start: 2017-07-06 — End: 2017-07-06
  Administered 2017-07-06: 2 mg via INTRAVENOUS
  Filled 2017-07-06: qty 1

## 2017-07-06 MED ORDER — GADOBENATE DIMEGLUMINE 529 MG/ML IV SOLN
17.0000 mL | Freq: Once | INTRAVENOUS | Status: AC | PRN
  Administered 2017-07-06: 17 mL via INTRAVENOUS

## 2017-07-06 MED ORDER — LORAZEPAM 2 MG/ML IJ SOLN: 0.0000 mg | Freq: Four times a day (QID) | INTRAMUSCULAR | Status: AC

## 2017-07-06 MED ORDER — ASPIRIN EC 81 MG PO TBEC
81.0000 mg | DELAYED_RELEASE_TABLET | Freq: Every day | ORAL | Status: DC
  Administered 2017-07-06 – 2017-07-08 (×2): 81 mg via ORAL
  Filled 2017-07-06 (×2): qty 1

## 2017-07-06 MED ORDER — SENNOSIDES-DOCUSATE SODIUM 8.6-50 MG PO TABS: 1.0000 | ORAL_TABLET | Freq: Every evening | ORAL | Status: DC | PRN

## 2017-07-06 MED ORDER — POTASSIUM CHLORIDE 2 MEQ/ML IV SOLN: INTRAVENOUS | Status: DC

## 2017-07-06 MED ORDER — VITAMIN B-1 100 MG PO TABS
100.0000 mg | ORAL_TABLET | Freq: Every day | ORAL | Status: DC
  Filled 2017-07-06: qty 1

## 2017-07-06 MED ORDER — ONDANSETRON HCL 4 MG/2ML IJ SOLN
4.0000 mg | Freq: Four times a day (QID) | INTRAMUSCULAR | Status: DC | PRN
  Administered 2017-07-06 – 2017-07-08 (×7): 4 mg via INTRAVENOUS
  Filled 2017-07-06 (×7): qty 2

## 2017-07-06 MED ORDER — ONDANSETRON HCL 4 MG/2ML IJ SOLN
4.0000 mg | Freq: Once | INTRAMUSCULAR | Status: AC
  Administered 2017-07-06: 4 mg via INTRAVENOUS
  Filled 2017-07-06: qty 2

## 2017-07-06 MED ORDER — ADULT MULTIVITAMIN W/MINERALS CH
1.0000 | ORAL_TABLET | Freq: Every day | ORAL | Status: DC
  Administered 2017-07-08: 1 via ORAL
  Filled 2017-07-06 (×2): qty 1

## 2017-07-06 MED ORDER — LORAZEPAM 1 MG PO TABS: 1.0000 mg | ORAL_TABLET | Freq: Four times a day (QID) | ORAL | Status: DC | PRN

## 2017-07-06 MED ORDER — LORAZEPAM 2 MG/ML IJ SOLN: 1.0000 mg | Freq: Four times a day (QID) | INTRAMUSCULAR | Status: DC | PRN

## 2017-07-06 MED ORDER — LORAZEPAM 2 MG/ML IJ SOLN: 0.0000 mg | Freq: Two times a day (BID) | INTRAMUSCULAR | Status: DC

## 2017-07-06 MED ORDER — POTASSIUM CHLORIDE 10 MEQ/100ML IV SOLN
10.0000 meq | INTRAVENOUS | Status: AC
  Administered 2017-07-06 (×3): 10 meq via INTRAVENOUS
  Filled 2017-07-06 (×4): qty 100

## 2017-07-06 MED ORDER — MORPHINE SULFATE (PF) 4 MG/ML IV SOLN
4.0000 mg | INTRAVENOUS | Status: DC | PRN
  Administered 2017-07-06 – 2017-07-07 (×2): 4 mg via INTRAVENOUS
  Filled 2017-07-06 (×2): qty 1

## 2017-07-06 MED ORDER — HEPARIN SODIUM (PORCINE) 5000 UNIT/ML IJ SOLN
5000.0000 [IU] | Freq: Three times a day (TID) | INTRAMUSCULAR | Status: DC
  Administered 2017-07-06 – 2017-07-08 (×5): 5000 [IU] via SUBCUTANEOUS
  Filled 2017-07-06 (×5): qty 1

## 2017-07-06 MED ORDER — SODIUM CHLORIDE 0.9 % IV SOLN
INTRAVENOUS | Status: DC
  Administered 2017-07-07: 11:00:00 via INTRAVENOUS

## 2017-07-06 MED ORDER — SALINE SPRAY 0.65 % NA SOLN
1.0000 | Freq: Three times a day (TID) | NASAL | Status: DC | PRN
  Administered 2017-07-06: 1 via NASAL
  Filled 2017-07-06: qty 44

## 2017-07-06 MED ORDER — FOLIC ACID 1 MG PO TABS
1.0000 mg | ORAL_TABLET | Freq: Every day | ORAL | Status: DC
  Administered 2017-07-08: 1 mg via ORAL
  Filled 2017-07-06 (×2): qty 1

## 2017-07-06 MED ORDER — DIPHENHYDRAMINE HCL 25 MG PO CAPS
25.0000 mg | ORAL_CAPSULE | Freq: Four times a day (QID) | ORAL | Status: DC | PRN
  Administered 2017-07-06: 25 mg via ORAL
  Filled 2017-07-06: qty 1

## 2017-07-06 MED ORDER — PEG 3350-KCL-NA BICARB-NACL 420 G PO SOLR
4000.0000 mL | Freq: Once | ORAL | Status: AC
  Administered 2017-07-06: 4000 mL via ORAL
  Filled 2017-07-06: qty 4000

## 2017-07-06 MED ORDER — MORPHINE SULFATE (PF) 2 MG/ML IV SOLN
2.0000 mg | INTRAVENOUS | Status: DC | PRN
  Administered 2017-07-06 – 2017-07-08 (×4): 2 mg via INTRAVENOUS
  Filled 2017-07-06 (×4): qty 1

## 2017-07-06 MED ORDER — BISACODYL 5 MG PO TBEC
5.0000 mg | DELAYED_RELEASE_TABLET | Freq: Every day | ORAL | Status: DC | PRN
Start: 2017-07-06 — End: 2017-07-08
  Filled 2017-07-06: qty 1

## 2017-07-06 MED ORDER — IOHEXOL 300 MG/ML  SOLN
100.0000 mL | Freq: Once | INTRAMUSCULAR | Status: AC | PRN
  Administered 2017-07-06: 100 mL via INTRAVENOUS

## 2017-07-06 MED ORDER — MAGNESIUM SULFATE 2 GM/50ML IV SOLN
2.0000 g | Freq: Once | INTRAVENOUS | Status: AC
  Administered 2017-07-06: 2 g via INTRAVENOUS

## 2017-07-06 NOTE — ED Provider Notes (Signed)
[facility] Emergency Department Provider Note   ____________________________________________   First MD Initiated Contact with Patient 07/06/17 0121     (approximate)  I have reviewed the triage vital signs and the nursing notes.   HISTORY  Chief Complaint Chest Pain    HPI Mackenzie Little is a 52 y.o. female who comes into the hospital today with some sharp pains in her chest.  She reports that it felt like there were pins sticking her all of her chest.  The patient also reports that she has had some abdominal bloating and distention.  This started on Thursday and has persisted.  She feels that when she tries to swallow something it gets stuck right in her abdomen and then she has vomiting.  The patient denies any significant pain in her abdomen but it does feel hard like there is a ball.  The patient states that her last bowel movement was 2 days ago and it was as if she was constipated.  She has been trying to take stool softeners but it does not always help.  She rates her chest pain currently as a 3 out of 10 in intensity.  She denies any shortness of breath but has been fatigued.  The patient denies any dizziness or lightheadedness and did have some black stool on Friday.  The patient does drink 3-4 mixed drinks daily.  She is here today for evaluation of her symptoms.   Past Medical History:  Diagnosis Date  . Hypertension     Patient Active Problem List   Diagnosis Date Noted  . Chest pain 07/06/2017  . Well woman exam 10/17/2015  . Anemia 06/04/2015  . Generalized anxiety disorder 05/02/2015  . Elevated blood pressure (not hypertension) 08/21/2014  . Menopausal disorder 08/21/2014    Past Surgical History:  Procedure Laterality Date  . TOOTH EXTRACTION  May 2016    Prior to Admission medications   Medication Sig Start Date End Date Taking? Authorizing Provider  hydrochlorothiazide (MICROZIDE) 12.5 MG capsule TAKE 1 CAPSULE (12.5 MG  TOTAL) BY MOUTH DAILY. Patient not taking: Reported on 07/06/2017 06/04/15   Lucille Passy, MD  naproxen (NAPROSYN) 500 MG tablet Take 1 tablet (500 mg total) by mouth 2 (two) times daily with a meal. Patient not taking: Reported on 07/06/2017 11/29/15   Sable Feil, PA-C  sertraline (ZOLOFT) 25 MG tablet TAKE 1 TABLET (25 MG TOTAL) BY MOUTH DAILY. Patient not taking: Reported on 07/06/2017 06/04/15   Lucille Passy, MD  traMADol (ULTRAM) 50 MG tablet Take 1 tablet (50 mg total) by mouth every 12 (twelve) hours as needed. Patient not taking: Reported on 07/06/2017 11/29/15   Sable Feil, PA-C    Allergies Patient has no known allergies.  History reviewed. No pertinent family history.  Social History Social History   Tobacco Use  . Smoking status: Former Research scientist (life sciences)  . Smokeless tobacco: Never Used  Substance Use Topics  . Alcohol use: Yes  . Drug use: No    Review of Systems  Constitutional: Fatigue Eyes: No visual changes. ENT: No sore throat. Cardiovascular:  chest pain. Respiratory: Denies shortness of breath. Gastrointestinal: Nausea and vomiting with no abdominal pain.  No diarrhea.   Genitourinary: Negative for dysuria. Musculoskeletal: Negative for back pain. Skin: Negative for rash. Neurological: Negative for headaches, focal weakness or numbness.   ____________________________________________   PHYSICAL EXAM:  VITAL SIGNS: ED Triage Vitals  Enc Vitals Group     BP 07/06/17  0051 (!) 164/76     Pulse Rate 07/06/17 0051 (!) 105     Resp 07/06/17 0051 20     Temp 07/06/17 0051 98.4 F (36.9 C)     Temp Source 07/06/17 0051 Oral     SpO2 07/06/17 0051 100 %     Weight 07/06/17 0052 180 lb (81.6 kg)     Height 07/06/17 0052 5\' 5"  (1.651 m)     Head Circumference --      Peak Flow --      Pain Score 07/06/17 0051 6     Pain Loc --      Pain Edu? --      Excl. in [place]? --     Constitutional: Alert and oriented. Well appearing and in moderate distress. Eyes:  Conjunctivae are normal. PERRL. EOMI. patient with scleral icterus Head: Atraumatic. Nose: No congestion/rhinnorhea. Mouth/Throat: Mucous membranes are moist.  Oropharynx non-erythematous. Cardiovascular: Tachycardia, irregular rhythm. Grossly normal heart sounds.  Good peripheral circulation. Respiratory: Normal respiratory effort.  No retractions. Lungs CTAB. Gastrointestinal: Soft and nontender.  Firm, right upper quadrant distention. No abdominal bruits. No CVA tenderness. Musculoskeletal: No lower extremity tenderness nor edema.   Neurologic:  Normal speech and language.  Skin:  Skin is warm, dry and intact.  Psychiatric: Mood and affect are normal.   ____________________________________________   LABS (all labs ordered are listed, but only abnormal results are displayed)  Labs Reviewed  BASIC METABOLIC PANEL - Abnormal; Notable for the following components:      Result Value   Potassium 2.4 (*)    BUN <5 (*)    Calcium 8.0 (*)    All other components within normal limits  CBC - Abnormal; Notable for the following components:   WBC 11.1 (*)    RBC 3.00 (*)    Hemoglobin 7.5 (*)    HCT 24.4 (*)    MCH 25.0 (*)    MCHC 30.7 (*)    RDW 18.3 (*)    All other components within normal limits  HEPATIC FUNCTION PANEL - Abnormal; Notable for the following components:   Albumin 2.2 (*)    AST 120 (*)    Alkaline Phosphatase 142 (*)    Total Bilirubin 6.2 (*)    Bilirubin, Direct 3.5 (*)    Indirect Bilirubin 2.7 (*)    All other components within normal limits  TROPONIN I  LIPASE, BLOOD  TYPE AND SCREEN   ____________________________________________  EKG  ED ECG REPORT I, Loney Hering, the attending physician, personally viewed and interpreted this ECG.   Date: 07/06/2017  EKG Time: 0049  Rate: 108  Rhythm: sinus tachycardia, PAC's noted  Axis: normal  Intervals:none  ST&T Change: ST depressions in lead I and aVL with some flipped T waves in lead V4, V5,  V6.  ____________________________________________  RADIOLOGY  ED MD interpretation:  CXR: No acute cardiopulmonary disease  CT abd and pelvis: Hepatomegaly with hepatic steatosis, morphologic changes of cirrhosis noted without space-occupying mass.  Ultrasound abdomen right upper quadrant: Hepatomegaly and hepatic steatosis, partially distended gallbladder with multiple gallstones, mild gallbladder wall thickening is nonspecific and may be secondary to chronic liver disease, no sonographic Murphy sign, mildly dilated common bile duct 9 mm of uncertain etiology.  Official radiology report(s): Dg Chest 2 View  Result Date: 07/06/2017 CLINICAL DATA:  Chest discomfort worsening tonight. EXAM: CHEST - 2 VIEW COMPARISON:  None. FINDINGS: Borderline cardiomegaly. No aortic aneurysm. Both lungs are clear. The visualized skeletal  structures are unremarkable. IMPRESSION: No active cardiopulmonary disease. Electronically Signed   By: Ashley Royalty M.D.   On: 07/06/2017 01:52   Ct Abdomen Pelvis W Contrast  Result Date: 07/06/2017 CLINICAL DATA:  Sharp central and left-sided chest pain. Nausea and vomiting for 2 days. EXAM: CT ABDOMEN AND PELVIS WITH CONTRAST TECHNIQUE: Multidetector CT imaging of the abdomen and pelvis was performed using the standard protocol following bolus administration of intravenous contrast. CONTRAST:  162mL OMNIPAQUE IOHEXOL 300 MG/ML  SOLN COMPARISON:  12/27/2008 pelvic ultrasound FINDINGS: Lower chest: Cardiomegaly without pericardial effusion or thickening. Clear lung bases. Hepatobiliary: Hepatomegaly with hepatic steatosis. Mild surface nodularity consistent with morphologic changes of cirrhosis. No space-occupying mass. Nondistended gallbladder with gallstones. Pancreas: No enhancing mass, ductal dilatation or inflammation. Spleen: The spleen measures 15.7 x 4.1 x 11.2 cm (volume = 380 cm^3) and is free of focal mass. Adrenals/Urinary Tract: Normal bilateral adrenal glands. Mild  irregular cortical thinning of the right kidney. No nephrolithiasis hydroureteronephrosis. The urinary bladder is unremarkable for the degree of distention. Stomach/Bowel: Stomach is within normal limits. Appendix appears normal. No evidence of bowel wall thickening, distention, or inflammatory changes. Vascular/Lymphatic: No significant vascular findings are present. No enlarged abdominal or pelvic lymph nodes. Reproductive: 5 cm fundal leiomyoma.  No adnexal mass. Other: No free air nor free fluid. Musculoskeletal: No aggressive osseous lesions. No acute osseous abnormality. IMPRESSION: 1. Hepatomegaly with hepatic steatosis. Morphologic changes of cirrhosis noted without space-occupying mass. 2. The included heart is enlarged without pericardial effusion or thickening. 3. Fibroid uterus. Electronically Signed   By: Ashley Royalty M.D.   On: 07/06/2017 03:24   US Abdomen Limited Ruq  Result Date: 07/06/2017 CLINICAL DATA:  Jaundice. EXAM: ULTRASOUND ABDOMEN LIMITED RIGHT UPPER QUADRANT COMPARISON:  Abdominopelvic CT earlier this day. FINDINGS: Gallbladder: Partially distended with multiple gallstones. Mild gallbladder wall thickening and wall edema, maximal thickness 3.3 mm. No sonographic Murphy sign noted by sonographer. Common bile duct: Diameter: 9 mm at the porta hepatis. Liver: No focal lesion identified. Diffusely increased and heterogeneous in echogenicity. Liver is enlarged. Portal vein is patent on color Doppler imaging with normal direction of blood flow towards the liver. IMPRESSION: 1. Hepatomegaly and hepatic steatosis. 2. Partially distended gallbladder with multiple gallstones. Mild gallbladder wall thickening is nonspecific and may be secondary to chronic liver disease or chronic cholecystitis. No sonographic Murphy sign. 3. Mildly dilated common bile duct 9 mm of uncertain etiology. This is not confirmed on CT earlier today and may be technical. Electronically Signed   By: Jeb Levering M.D.    On: 07/06/2017 05:05    ____________________________________________   PROCEDURES  Procedure(s) performed: None  Procedures  Critical Care performed: No  ____________________________________________   INITIAL IMPRESSION / ASSESSMENT AND PLAN / ED COURSE  As part of my medical decision making, I reviewed the following data within the electronic MEDICAL RECORD NUMBER Notes from prior ED visits and [place] Controlled Substance Database   This is a 52 year old female who comes into the hospital today with some chest pain as well as some abdominal distention.  The patient does drink daily and has some firmness to her abdomen.  My differential diagnosis includes acute coronary syndrome, reflux, nonspecific chest pain  On exam of the patient it appears that she also has some jaundice.  Given the patient's firm abdomen I am also concerned for malignancy.  The patient did have some blood work and her CBC returned with a hemoglobin of 7.5 and a hematocrit of  24.4 which is new from 2 years ago.  The patient's troponin is negative but her bilirubin is found to be 6.2.  I will send the patient for a CT scan of her abdomen and pelvis and she will be reassessed.  The patient CT scan showed some gallstones but no mass or other cause of the patient's hepatomegaly or jaundice.  I did send the patient for an ultrasound which showed a dilated common bile duct.  At this time the patient has painless jaundice which is concerning for malignancy.  The patient is also anemic with chest pain.  I will admit her to the hospitalist service for further evaluation.  She did receive some potassium for her hypokalemia.  She will be admitted for evaluation.      ____________________________________________   FINAL CLINICAL IMPRESSION(S) / ED DIAGNOSES  Final diagnoses:  Jaundice  Chest pain, unspecified type  Anemia, unspecified type  Choledocholithiasis  Hypokalemia     ED Discharge Orders    None        Note:  This document was prepared using Dragon voice recognition software and may include unintentional dictation errors.    Loney Hering, MD 07/06/17 [phone]

## 2017-07-06 NOTE — Progress Notes (Signed)
Pharmacy Electrolyte Monitoring Consult:  Pharmacy consulted to assist in monitoring and replacing electrolytes in this 52 y.o. female admitted on 07/06/2017 with Chest Pain   Labs:  Sodium (mmol/L)  Date Value  07/06/2017 137   Potassium (mmol/L)  Date Value  07/06/2017 2.8 (L)   Magnesium (mg/dL)  Date Value  07/06/2017 1.5 (L)   Calcium (mg/dL)  Date Value  07/06/2017 8.0 (L)   Albumin (g/dL)  Date Value  07/06/2017 2.2 (L)    Assessment/Plan: K 2.4    Mag pending MD ordered KCL IV 10 meq x4 doses, LR w/ KCL 40 meq/L @ 125 ml/hr and Magnesium sulfate 2 gram IV x 1.  Will f/u Potassium level at 1200 and electrolytes with am labs  07/06/17 13:23 K has improved to 2.8 after potassium chloride 10 mEq IV Q1H x 4 doses and approximately 5 hours of LR with 40 mEq/L (about 25 mEq K). Magnesium has not been administered. Will discontinue ED dose and place new 2 gm IV x 1 magnesium sulfate order, in addition to 4 more runs KCL 10 mEq IV Q1H x 4 doses. Will recheck potassium 1 hour after last IV dose completed and recheck all electrolytes tomorrow with AM labs.   Laural Benes, Pharm.D., BCPS Clinical Pharmacist 07/06/2017 1:52 PM

## 2017-07-06 NOTE — ED Notes (Signed)
Pt reports pain in leg (left) from arthritis

## 2017-07-06 NOTE — Progress Notes (Signed)
*  PRELIMINARY RESULTS* Echocardiogram 2D Echocardiogram has been performed.  Sherrie Sport 07/06/2017, 11:31 AM

## 2017-07-06 NOTE — ED Notes (Signed)
Patient transported to CT 

## 2017-07-06 NOTE — Consult Note (Signed)
Cephas Darby, MD [address]  [place]  [place], [place] [address]  Main: [phone]  Fax: [phone] Pager: [phone]   Consultation  Referring Provider:     No ref. provider found Primary Care Physician:  Patient, No Pcp Per Primary Gastroenterologist:  Dr. Sherri Sear         Reason for Consultation:     Cirrhosis, hepatomegaly  Date of Admission:  07/06/2017 Date of Consultation:  07/06/2017         HPI:   Mackenzie Little is a 52 y.o. African-American female with history of hypertension presents with left-sided chest discomfort starting last night, cardiac etiology is ruled out. Her troponins were negative. She was found to have elevated LFTs, cholestatic pattern with elevated alkaline phosphatase and direct bilirubin. Therefore underwent ultrasound abdomen, subsequently CT which revealed significant hepatomegaly, cirrhosis and hepatic steatosis with normal caliber CBD. She also underwent MRCP which revealed a 8 mm arterial enhancing lesion in the liver along with cirrhosis, hepatomegaly, hepatic steatosis as well as mild splenomegaly. Patient reports that for the last 2 weeks she has been experiencing early satiety, feeling full easily associated with immediate postprandial nausea and emesis. she noticed yellowish discoloration of her eyes and skin. She denies left-sided chest pain, abdominal pain. She also reports Chronic constipation.  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  Patient denies melena, hematemesis. Does notice intermittent rectal bleeding especially on wiping  NSAIDs: heavy NSAID use  Antiplts/Anticoagulants/Anti thrombotics: none  GI Procedures: none She denies family history of GI malignancy  Past Medical  History:  Diagnosis Date  . Hypertension     Past Surgical History:  Procedure Laterality Date  . TOOTH EXTRACTION  May 2016    Prior to Admission medications   Medication Sig Start Date End Date Taking? Authorizing Provider  hydrochlorothiazide (MICROZIDE) 12.5 MG capsule TAKE 1 CAPSULE (12.5 MG TOTAL) BY MOUTH DAILY. Patient not taking: Reported on 07/06/2017 06/04/15   Lucille Passy, MD  naproxen (NAPROSYN) 500 MG tablet Take 1 tablet (500 mg total) by mouth 2 (two) times daily with a meal. Patient not taking: Reported on 07/06/2017 11/29/15   Sable Feil, PA-C  sertraline (ZOLOFT) 25 MG tablet TAKE 1 TABLET (25 MG TOTAL) BY MOUTH DAILY. Patient not taking: Reported on 07/06/2017 06/04/15   Lucille Passy, MD  traMADol (ULTRAM) 50 MG tablet Take 1 tablet (50 mg total) by mouth every 12 (twelve) hours as needed. Patient not taking: Reported on 07/06/2017 11/29/15   Sable Feil, PA-C    History reviewed. No pertinent family history.   Social History   Tobacco Use  . Smoking status: Former Research scientist (life sciences)  . Smokeless tobacco: Never Used  Substance Use Topics  . Alcohol use: Yes  . Drug use: No    Allergies as of 07/06/2017  . (No Known Allergies)    Review of Systems:    All systems reviewed and negative except where noted in HPI.   Physical Exam:  Vital signs in last 24 hours: Temp:  [98.3 F (36.8 C)-98.6 F (37 C)] 98.3 F (36.8 C) (06/10 1707) Pulse Rate:  [89-105] 89 (06/10 1707) Resp:  [14-25] 18 (06/10 1707) BP: (103-164)/(67-92) 131/76 (  06/10 1707) SpO2:  [100 %] 100 % (06/10 1707) Weight:  [180 lb (81.6 kg)-181 lb 11.2 oz (82.4 kg)] 181 lb 11.2 oz (82.4 kg) (06/10 0907) Last BM Date: 07/01/17   General:   Pleasant, cooperative in NAD, ill-appearing Head:  Normocephalic and atraumatic. Eyes:  deep icterus.   Conjunctiva pale. PERRLA. Ears:  Normal auditory acuity. Neck:  Supple; no masses or thyroidomegaly Lungs: Respirations even and unlabored. Lungs clear to  auscultation bilaterally.   No wheezes, crackles, or rhonchi.  Heart:  Regular rate and rhythm;  Without murmur, clicks, rubs or gallops Abdomen:  Soft, distended, asymmetric, predominantly in the epigastric and right upper quadrant, dull to percussion, firm in consistency from significant hepatomegaly, nontender. Normal bowel sounds. No rebound or guarding.  Rectal:  Not performed. Msk:  Symmetrical without gross deformities.  Strength generalized weakness  Extremities:  1+ edema, cyanosis or clubbing. Neurologic:  Alert and oriented x3;  grossly normal neurologically. Skin:  Intact without significant lesions or rashes. Cervical Nodes:  No significant cervical adenopathy. Psych:  Alert and cooperative. Normal affect.  LAB RESULTS: CBC Latest Ref Rng & Units 07/06/2017 10/17/2015 06/04/2015  WBC 3.6 - 11.0 K/uL 11.1(H) 8.8 9.9  Hemoglobin 12.0 - 16.0 g/dL 7.5(L) 11.6(L) 9.7(L)  Hematocrit 35.0 - 47.0 % 24.4(L) 36.3 31.0(L)  Platelets 150 - 440 K/uL 207 226.0 362.0    BMET BMP Latest Ref Rng & Units 07/06/2017 07/06/2017 10/17/2015  Glucose 65 - 99 mg/dL - 84 87  BUN 6 - 20 mg/dL - <5(L) 16  Creatinine 0.44 - 1.00 mg/dL - 0.61 0.80  Sodium 135 - 145 mmol/L - 137 139  Potassium 3.5 - 5.1 mmol/L 2.8(L) 2.4(LL) 4.3  Chloride 101 - 111 mmol/L - 101 99  CO2 22 - 32 mmol/L - 23 29  Calcium 8.9 - 10.3 mg/dL - 8.0(L) 9.8    LFT Hepatic Function Latest Ref Rng & Units 07/06/2017 10/17/2015 05/02/2015  Total Protein 6.5 - 8.1 g/dL 7.8 8.9(H) 8.5(H)  Albumin 3.5 - 5.0 g/dL 2.2(L) 4.1 3.8  AST 15 - 41 U/L 120(H) 168(H) 82(H)  ALT 14 - 54 U/L 20 61(H) 29  Alk Phosphatase 38 - 126 U/L 142(H) 116 106  Total Bilirubin 0.3 - 1.2 mg/dL 6.2(H) 0.8 1.3(H)  Bilirubin, Direct 0.1 - 0.5 mg/dL 3.5(H) - -     STUDIES: Dg Chest 2 View  Result Date: 07/06/2017 CLINICAL DATA:  Chest discomfort worsening tonight. EXAM: CHEST - 2 VIEW COMPARISON:  None. FINDINGS: Borderline cardiomegaly. No aortic aneurysm.  Both lungs are clear. The visualized skeletal structures are unremarkable. IMPRESSION: No active cardiopulmonary disease. Electronically Signed   By: Ashley Royalty M.D.   On: 07/06/2017 01:52   Ct Abdomen Pelvis W Contrast  Result Date: 07/06/2017 CLINICAL DATA:  Sharp central and left-sided chest pain. Nausea and vomiting for 2 days. EXAM: CT ABDOMEN AND PELVIS WITH CONTRAST TECHNIQUE: Multidetector CT imaging of the abdomen and pelvis was performed using the standard protocol following bolus administration of intravenous contrast. CONTRAST:  163m OMNIPAQUE IOHEXOL 300 MG/ML  SOLN COMPARISON:  12/27/2008 pelvic ultrasound FINDINGS: Lower chest: Cardiomegaly without pericardial effusion or thickening. Clear lung bases. Hepatobiliary: Hepatomegaly with hepatic steatosis. Mild surface nodularity consistent with morphologic changes of cirrhosis. No Little-occupying mass. Nondistended gallbladder with gallstones. Pancreas: No enhancing mass, ductal dilatation or inflammation. Spleen: The spleen measures 15.7 x 4.1 x 11.2 cm (volume = 380 cm^3) and is free of focal mass. Adrenals/Urinary Tract: Normal bilateral adrenal glands. Mild  irregular cortical thinning of the right kidney. No nephrolithiasis hydroureteronephrosis. The urinary bladder is unremarkable for the degree of distention. Stomach/Bowel: Stomach is within normal limits. Appendix appears normal. No evidence of bowel wall thickening, distention, or inflammatory changes. Vascular/Lymphatic: No significant vascular findings are present. No enlarged abdominal or pelvic lymph nodes. Reproductive: 5 cm fundal leiomyoma.  No adnexal mass. Other: No free air nor free fluid. Musculoskeletal: No aggressive osseous lesions. No acute osseous abnormality. IMPRESSION: 1. Hepatomegaly with hepatic steatosis. Morphologic changes of cirrhosis noted without Little-occupying mass. 2. The included heart is enlarged without pericardial effusion or thickening. 3. Fibroid uterus.  Electronically Signed   By: Ashley Royalty M.D.   On: 07/06/2017 03:24   Mr 3d Recon At Scanner  Result Date: 07/06/2017 CLINICAL DATA:  Abdominal pain.  Abnormal liver function test. EXAM: MRI ABDOMEN WITHOUT AND WITH CONTRAST (INCLUDING MRCP) TECHNIQUE: Multiplanar multisequence MR imaging of the abdomen was performed both before and after the administration of intravenous contrast. Heavily T2-weighted images of the biliary and pancreatic ducts were obtained, and three-dimensional MRCP images were rendered by post processing. CONTRAST:  47m MULTIHANCE GADOBENATE DIMEGLUMINE 529 MG/ML IV SOLN COMPARISON:  07/06/2017 FINDINGS: Lower chest: No acute findings. Hepatobiliary: There is marked diffuse hepatic steatosis. The liver appears enlarged measuring 28 cm in length. The contour of the liver is slightly irregular. Small arterial phase enhancing structure within segment 8 measures 8 mm, image 37/19. This is T2 hyperintense and exhibits early arterial phase enhancement that persists on the delayed images. No washout associated with this lesion. Multiple stones identified within the neck of gallbladder which measure up to 5 mm. No gallbladder wall thickening or pericholecystic fluid. No biliary dilatation identified. Pancreas: Unremarkable appearance of the pancreas. No inflammation or mass. No main duct dilatation. Spleen: The spleen measures 12.1 by 11.8 by 5.6 cm (volume = 420 cm^3). Adrenals/Urinary Tract: Normal adrenal glands. Unremarkable appearance of the left kidney. Scarring and volume loss from the right kidney noted. No mass or hydronephrosis identified. Stomach/Bowel: The stomach is normal. The visualized upper abdominal bowel loops are unremarkable. Vascular/Lymphatic: No pathologically enlarged lymph nodes identified. No enlarged lymph nodes identified within the upper abdomen. Other:  No ascites. Musculoskeletal: No suspicious bone lesions identified. IMPRESSION: 1. Morphologic features of liver  compatible with cirrhosis. Hepatomegaly and hepatic steatosis noted. 2. Arterial phase enhancing structure is identified within segment 8. In a patient that is at increased risk for hepatoma this is considered a LR-3: [facility] Follow[facility] imaging in 6 months with repeat contrast enhanced MRI of the liver is recommended. 3. Borderline splenomegaly. 4. Gallstones. Electronically Signed   By: TKerby MoorsM.D.   On: 07/06/2017 13:08   Mr Abdomen Mrcp WMoise BoringContast  Result Date: 07/06/2017 CLINICAL DATA:  Abdominal pain.  Abnormal liver function test. EXAM: MRI ABDOMEN WITHOUT AND WITH CONTRAST (INCLUDING MRCP) TECHNIQUE: Multiplanar multisequence MR imaging of the abdomen was performed both before and after the administration of intravenous contrast. Heavily T2-weighted images of the biliary and pancreatic ducts were obtained, and three-dimensional MRCP images were rendered by post processing. CONTRAST:  153mMULTIHANCE GADOBENATE DIMEGLUMINE 529 MG/ML IV SOLN COMPARISON:  07/06/2017 FINDINGS: Lower chest: No acute findings. Hepatobiliary: There is marked diffuse hepatic steatosis. The liver appears enlarged measuring 28 cm in length. The contour of the liver is slightly irregular. Small arterial phase enhancing structure within segment 8 measures 8 mm, image 37/19. This is T2 hyperintense and exhibits early arterial phase enhancement that  persists on the delayed images. No washout associated with this lesion. Multiple stones identified within the neck of gallbladder which measure up to 5 mm. No gallbladder wall thickening or pericholecystic fluid. No biliary dilatation identified. Pancreas: Unremarkable appearance of the pancreas. No inflammation or mass. No main duct dilatation. Spleen: The spleen measures 12.1 by 11.8 by 5.6 cm (volume = 420 cm^3). Adrenals/Urinary Tract: Normal adrenal glands. Unremarkable appearance of the left kidney. Scarring and volume loss from the right kidney  noted. No mass or hydronephrosis identified. Stomach/Bowel: The stomach is normal. The visualized upper abdominal bowel loops are unremarkable. Vascular/Lymphatic: No pathologically enlarged lymph nodes identified. No enlarged lymph nodes identified within the upper abdomen. Other:  No ascites. Musculoskeletal: No suspicious bone lesions identified. IMPRESSION: 1. Morphologic features of liver compatible with cirrhosis. Hepatomegaly and hepatic steatosis noted. 2. Arterial phase enhancing structure is identified within segment 8. In a patient that is at increased risk for hepatoma this is considered a LR-3: Intermediate probability for [facility]. Followup imaging in 6 months with repeat contrast enhanced MRI of the liver is recommended. 3. Borderline splenomegaly. 4. Gallstones. Electronically Signed   By: Kerby Moors M.D.   On: 07/06/2017 13:08   US Abdomen Limited Ruq  Result Date: 07/06/2017 CLINICAL DATA:  Jaundice. EXAM: ULTRASOUND ABDOMEN LIMITED RIGHT UPPER QUADRANT COMPARISON:  Abdominopelvic CT earlier this day. FINDINGS: Gallbladder: Partially distended with multiple gallstones. Mild gallbladder wall thickening and wall edema, maximal thickness 3.3 mm. No sonographic Murphy sign noted by sonographer. Common bile duct: Diameter: 9 mm at the porta hepatis. Liver: No focal lesion identified. Diffusely increased and heterogeneous in echogenicity. Liver is enlarged. Portal vein is patent on color Doppler imaging with normal direction of blood flow towards the liver. IMPRESSION: 1. Hepatomegaly and hepatic steatosis. 2. Partially distended gallbladder with multiple gallstones. Mild gallbladder wall thickening is nonspecific and may be secondary to chronic liver disease or chronic cholecystitis. No sonographic Murphy sign. 3. Mildly dilated common bile duct 9 mm of uncertain etiology. This is not confirmed on CT earlier today and may be technical. Electronically Signed   By: Jeb Levering M.D.   On:  07/06/2017 05:05      Impression / Plan:   Mackenzie Little is a 52 y.o. African-American female with history of hypertension, heavy alcohol and NSAID use, who initially presented with left-sided chest pain which was self-limited and resolved, cardiac etiology ruled out. GI consulted for incidental findings of cholestatic hepatitis with cirrhosis, hepatomegaly. MRCP also revealed 8 mm arterial enhancing lesion in the liver.   Cholestatic hepatitis with cirrhosis and hepatomegaly: probably in the setting of alcohol use and heavy NSAID use - Complete secondary liver disease workup - currently euvolemic - abstinence from alcohol and NSAID use - liver lesion on MRCP: check AFP and potential liver biopsy  Acute on chronic anemia - Workup of anemia - recommend EGD and colonoscopy - maintain clear liquid diet - Bowel prep tonight - Nothing by mouth past midnight  I have discussed alternative options, risks & benefits,  which include, but are not limited to, bleeding, infection, perforation,respiratory complication & drug reaction.  The patient agrees with this plan & written consent will be obtained.    Thank you for involving me in the care of this patient. Will follow along with you    LOS: 0 days   Sherri Sear, MD  07/06/2017, 5:39 PM   Note: This dictation was prepared with Dragon dictation along with smaller phrase technology. Any  transcriptional errors that result from this process are unintentional.

## 2017-07-06 NOTE — Progress Notes (Signed)
Patient presents with atypical chest pain.  Chart reviewed.  Continue CIWA protocol and troponins with telemetry follow-up on echocardiogram for heart murmur and add MRCP to evaluate for choledocholithiasis.

## 2017-07-06 NOTE — ED Notes (Signed)
Patient transported to X-ray 

## 2017-07-06 NOTE — Progress Notes (Signed)
Pharmacy Electrolyte Monitoring Consult:  Pharmacy consulted to assist in monitoring and replacing electrolytes in this 52 y.o. female admitted on 07/06/2017 with Chest Pain   Labs:  Sodium (mmol/L)  Date Value  07/06/2017 137   Potassium (mmol/L)  Date Value  07/06/2017 2.4 (LL)   Calcium (mg/dL)  Date Value  07/06/2017 8.0 (L)   Albumin (g/dL)  Date Value  07/06/2017 2.2 (L)    Assessment/Plan: K 2.4    Mag pending MD ordered KCL IV 10 meq x4 doses, LR w/ KCL 40 meq/L @ 125 ml/hr and Magnesium sulfate 2 gram IV x 1.  Will f/u Potassium level at 1200 and electrolytes with am labs  Mackenzie Little A 07/06/2017 7:44 AM

## 2017-07-06 NOTE — ED Notes (Signed)
CRITICAL LAB: POTASSIUM is 2.4, ROBIN Lab, Dr. Dahlia Client notified, orders received

## 2017-07-06 NOTE — ED Triage Notes (Signed)
Patient reports symptoms started yesterday with chest discomfort that was worse tonight.  Reports laying down makes it worse.  Also reports vomiting since Thursday.

## 2017-07-06 NOTE — ED Notes (Addendum)
ED Provider at bedside.  Pt c/o of sharp pricking pain in center and left of center chest 10/10 pain that had resolved after triage, pt reports N/V for several days at home and SOB with weakness  Pt reports dark stool last week, and recent inability to tolerate activity, pt report hx of drinking (4 glasses mixed drinks daily at times), pt appears sallow and jaundiced - left eyelid especially  Pt denies hepatitis

## 2017-07-06 NOTE — H&P (Addendum)
[facility] [place] NAME: Mackenzie Little    MR#:  [id number]  DATE OF BIRTH:  06/17/1965  DATE OF ADMISSION:  07/06/2017  PRIMARY CARE PHYSICIAN: Patient, No Pcp Per   REQUESTING/REFERRING PHYSICIAN: Loney Hering, MD  CHIEF COMPLAINT:   Chief Complaint  Patient presents with  . Chest Pain    HISTORY OF PRESENT ILLNESS:  Mackenzie Little  is a 52 y.o. female with a known history of EtOH use, HTN p/w CP. Drinks 3-4 mixed drinks per day. Endorse L-sided CP above L breast, characterized as needle pricks over the skin since Saturday 06/08. The symptoms were minimal and not alarming. On Sunday 06/09, starting @~2000PM, pt started having N/V, throwing up everytime she tried to eat. She denies vomiting otherwise, only when she tried to eat or take pills. She endorses poor appetite and dehydration, as well as jaundice. She denies abdominal pain. She denies diarrhea or blood in the stool. She endorses constipation. She is comfortable at the time of my assessment, and is icteric on exam, but is not in distress. ED imaging (+) CBD dilation, likely choledocholithiasis, hepatomegaly.  PAST MEDICAL HISTORY:   Past Medical History:  Diagnosis Date  . Hypertension     PAST SURGICAL HISTORY:   Past Surgical History:  Procedure Laterality Date  . TOOTH EXTRACTION  May 2016    SOCIAL HISTORY:   Social History   Tobacco Use  . Smoking status: Former Research scientist (life sciences)  . Smokeless tobacco: Never Used  Substance Use Topics  . Alcohol use: Yes    FAMILY HISTORY:  History reviewed. No pertinent family history.  DRUG ALLERGIES:  No Known Allergies  REVIEW OF SYSTEMS:   Review of Systems  Constitutional: Negative for chills, diaphoresis, fever, malaise/fatigue and weight loss.  HENT: Negative for congestion, ear pain, hearing loss, nosebleeds, sinus pain, sore throat and tinnitus.   Eyes: Negative for blurred vision, double vision and  photophobia.  Respiratory: Negative for cough, hemoptysis, sputum production, shortness of breath and wheezing.   Cardiovascular: Positive for chest pain. Negative for palpitations, orthopnea, claudication, leg swelling and PND.  Gastrointestinal: Positive for constipation, nausea and vomiting. Negative for abdominal pain, blood in stool, diarrhea and melena.  Genitourinary: Negative for dysuria, frequency, hematuria and urgency.  Musculoskeletal: Negative for back pain, joint pain, myalgias and neck pain.  Skin: Negative for itching and rash.  Neurological: Negative for dizziness, tingling, tremors, sensory change, speech change, focal weakness, seizures, loss of consciousness, weakness and headaches.  Psychiatric/Behavioral: Negative for memory loss. The patient does not have insomnia.    MEDICATIONS AT HOME:   Prior to Admission medications   Medication Sig Start Date End Date Taking? Authorizing Provider  hydrochlorothiazide (MICROZIDE) 12.5 MG capsule TAKE 1 CAPSULE (12.5 MG TOTAL) BY MOUTH DAILY. Patient not taking: Reported on 07/06/2017 06/04/15   Lucille Passy, MD  naproxen (NAPROSYN) 500 MG tablet Take 1 tablet (500 mg total) by mouth 2 (two) times daily with a meal. Patient not taking: Reported on 07/06/2017 11/29/15   Sable Feil, PA-C  sertraline (ZOLOFT) 25 MG tablet TAKE 1 TABLET (25 MG TOTAL) BY MOUTH DAILY. Patient not taking: Reported on 07/06/2017 06/04/15   Lucille Passy, MD  traMADol (ULTRAM) 50 MG tablet Take 1 tablet (50 mg total) by mouth every 12 (twelve) hours as needed. Patient not taking: Reported on 07/06/2017 11/29/15   Sable Feil, PA-C      VITAL SIGNS:  Blood pressure 138/87, pulse 99, temperature 98.4 F (36.9 C), temperature source Oral, resp. rate 14, height 5\' 5"  (1.651 m), weight 81.6 kg (180 lb), SpO2 100 %.  PHYSICAL EXAMINATION:  Physical Exam  Constitutional: She is oriented to person, place, and time. She appears well-developed and  well-nourished. She is active and cooperative.  Non-toxic appearance. She does not have a sickly appearance. She does not appear ill. No distress. She is not intubated.  HENT:  Head: Normocephalic and atraumatic.  Mouth/Throat: Oropharynx is clear and moist. No oropharyngeal exudate.  Eyes: Conjunctivae, EOM and lids are normal. Scleral icterus is present.  Neck: Neck supple. No JVD present. No thyromegaly present.  Cardiovascular: Normal rate, regular rhythm, S1 normal and S2 normal.  No extrasystoles are present. Exam reveals no gallop, no S3, no S4, no distant heart sounds and no friction rub.  Murmur heard.  Systolic murmur is present with a grade of 4/6.  No diastolic murmur is present. Pulmonary/Chest: Effort normal and breath sounds normal. No accessory muscle usage or stridor. No apnea, no tachypnea and no bradypnea. She is not intubated. No respiratory distress. She has no decreased breath sounds. She has no wheezes. She has no rhonchi. She has no rales.  Abdominal: Soft. Bowel sounds are normal. She exhibits no distension. There is no tenderness. There is no rigidity, no rebound and no guarding.  Musculoskeletal: Normal range of motion. She exhibits no edema or tenderness.       Right lower leg: Normal. She exhibits no tenderness and no edema.       Left lower leg: Normal. She exhibits no tenderness and no edema.  Lymphadenopathy:    She has no cervical adenopathy.  Neurological: She is alert and oriented to person, place, and time. She is not disoriented.  Skin: Skin is warm, dry and intact. No rash noted. She is not diaphoretic. No erythema.  Psychiatric: She has a normal mood and affect. Her speech is normal and behavior is normal. Judgment and thought content normal. Her mood appears not anxious. She is not agitated. Cognition and memory are normal.   Jaundice, icterus. No TTP abdomen. (+) systolic murmur. LABORATORY PANEL:   CBC Recent Labs  Lab 07/06/17 0059  WBC 11.1*    HGB 7.5*  HCT 24.4*  PLT 207   ------------------------------------------------------------------------------------------------------------------  Chemistries  Recent Labs  Lab 07/06/17 0059  NA 137  K 2.4*  CL 101  CO2 23  GLUCOSE 84  BUN <5*  CREATININE 0.61  CALCIUM 8.0*  AST 120*  ALT 20  ALKPHOS 142*  BILITOT 6.2*   ------------------------------------------------------------------------------------------------------------------  Cardiac Enzymes Recent Labs  Lab 07/06/17 0059  TROPONINI <0.03   ------------------------------------------------------------------------------------------------------------------  RADIOLOGY:  Dg Chest 2 View  Result Date: 07/06/2017 CLINICAL DATA:  Chest discomfort worsening tonight. EXAM: CHEST - 2 VIEW COMPARISON:  None. FINDINGS: Borderline cardiomegaly. No aortic aneurysm. Both lungs are clear. The visualized skeletal structures are unremarkable. IMPRESSION: No active cardiopulmonary disease. Electronically Signed   By: Ashley Royalty M.D.   On: 07/06/2017 01:52   Ct Abdomen Pelvis W Contrast  Result Date: 07/06/2017 CLINICAL DATA:  Sharp central and left-sided chest pain. Nausea and vomiting for 2 days. EXAM: CT ABDOMEN AND PELVIS WITH CONTRAST TECHNIQUE: Multidetector CT imaging of the abdomen and pelvis was performed using the standard protocol following bolus administration of intravenous contrast. CONTRAST:  155mL OMNIPAQUE IOHEXOL 300 MG/ML  SOLN COMPARISON:  12/27/2008 pelvic ultrasound FINDINGS: Lower chest: Cardiomegaly without pericardial effusion or thickening.  Clear lung bases. Hepatobiliary: Hepatomegaly with hepatic steatosis. Mild surface nodularity consistent with morphologic changes of cirrhosis. No space-occupying mass. Nondistended gallbladder with gallstones. Pancreas: No enhancing mass, ductal dilatation or inflammation. Spleen: The spleen measures 15.7 x 4.1 x 11.2 cm (volume = 380 cm^3) and is free of focal mass.  Adrenals/Urinary Tract: Normal bilateral adrenal glands. Mild irregular cortical thinning of the right kidney. No nephrolithiasis hydroureteronephrosis. The urinary bladder is unremarkable for the degree of distention. Stomach/Bowel: Stomach is within normal limits. Appendix appears normal. No evidence of bowel wall thickening, distention, or inflammatory changes. Vascular/Lymphatic: No significant vascular findings are present. No enlarged abdominal or pelvic lymph nodes. Reproductive: 5 cm fundal leiomyoma.  No adnexal mass. Other: No free air nor free fluid. Musculoskeletal: No aggressive osseous lesions. No acute osseous abnormality. IMPRESSION: 1. Hepatomegaly with hepatic steatosis. Morphologic changes of cirrhosis noted without space-occupying mass. 2. The included heart is enlarged without pericardial effusion or thickening. 3. Fibroid uterus. Electronically Signed   By: Ashley Royalty M.D.   On: 07/06/2017 03:24   US Abdomen Limited Ruq  Result Date: 07/06/2017 CLINICAL DATA:  Jaundice. EXAM: ULTRASOUND ABDOMEN LIMITED RIGHT UPPER QUADRANT COMPARISON:  Abdominopelvic CT earlier this day. FINDINGS: Gallbladder: Partially distended with multiple gallstones. Mild gallbladder wall thickening and wall edema, maximal thickness 3.3 mm. No sonographic Murphy sign noted by sonographer. Common bile duct: Diameter: 9 mm at the porta hepatis. Liver: No focal lesion identified. Diffusely increased and heterogeneous in echogenicity. Liver is enlarged. Portal vein is patent on color Doppler imaging with normal direction of blood flow towards the liver. IMPRESSION: 1. Hepatomegaly and hepatic steatosis. 2. Partially distended gallbladder with multiple gallstones. Mild gallbladder wall thickening is nonspecific and may be secondary to chronic liver disease or chronic cholecystitis. No sonographic Murphy sign. 3. Mildly dilated common bile duct 9 mm of uncertain etiology. This is not confirmed on CT earlier today and may be  technical. Electronically Signed   By: Jeb Levering M.D.   On: 07/06/2017 05:05   IMPRESSION AND PLAN:   A/P: 20F CP, likely referred from suspected choledocholithiasis. Transaminasemia, hyperbilirubinemia, hypoalbuminemia, hypokalemia, normocytic anemia, EtOH use/dependence. -CP not likely cardiac in etiology, likely referred pain from GI pathology -Will monitor for cardiac pathology anyhow -Tele, cardiac monitoring -Trop-I (-) x1, 2x rpt pending -EKG PRN -[id number] -Morphine, NTG PRN -O2 PRN -GI consult, pt likely to need ERCP -Normocytic anemia may be hemolytic in nature, and may be causing pt's gallstones. Pt ordered for hemolytic labs and iron studies. She also has a loud systolic murmur on exam. I wonder if an undiagnosed/worsening valvulopathy may be causing her hemolysis. She is ordered for an Echo -CIWA -Thiamine, folate, MVI -Symptomatic mgmt, pain ctrl, antiemetics -IVF -Replete K+ -Ordered for Mag -Not taking any home meds -NPO, advance diet as tolerated -Heparin -Full code -Admission, > 2 midnights   All the records are reviewed and case discussed with ED provider. Management plans discussed with the patient, family and they are in agreement.  CODE STATUS: Full code  TOTAL TIME TAKING CARE OF THIS PATIENT: 90 minutes.    Arta Silence M.D on 07/06/2017 at 7:21 AM  Between 7am to 6pm - Pager - [phone]  After 6pm go to [email or web address] - Proofreader  [facility]  Office  [phone]  CC: Primary care physician; Patient, No Pcp Per   Note: This dictation was prepared with Dragon dictation along with smaller phrase technology. Any transcriptional errors that result from this  process are unintentional.

## 2017-07-07 ENCOUNTER — Inpatient Hospital Stay: Payer: Self-pay | Admitting: Anesthesiology

## 2017-07-07 ENCOUNTER — Encounter
Admission: EM | Disposition: A | Discharge status: Home / Self Care | Payer: Self-pay | Source: Home / Self Care | Attending: Internal Medicine

## 2017-07-07 DIAGNOSIS — D269 Other benign neoplasm of uterus, unspecified: Secondary | ICD-10-CM

## 2017-07-07 DIAGNOSIS — K210 Gastro-esophageal reflux disease with esophagitis: Secondary | ICD-10-CM

## 2017-07-07 DIAGNOSIS — R6881 Early satiety: Secondary | ICD-10-CM

## 2017-07-07 DIAGNOSIS — D124 Benign neoplasm of descending colon: Secondary | ICD-10-CM

## 2017-07-07 DIAGNOSIS — K766 Portal hypertension: Secondary | ICD-10-CM

## 2017-07-07 DIAGNOSIS — D125 Benign neoplasm of sigmoid colon: Secondary | ICD-10-CM

## 2017-07-07 DIAGNOSIS — D500 Iron deficiency anemia secondary to blood loss (chronic): Secondary | ICD-10-CM

## 2017-07-07 DIAGNOSIS — K625 Hemorrhage of anus and rectum: Secondary | ICD-10-CM

## 2017-07-07 DIAGNOSIS — D122 Benign neoplasm of ascending colon: Secondary | ICD-10-CM

## 2017-07-07 HISTORY — PX: COLONOSCOPY: SHX5424

## 2017-07-07 HISTORY — PX: ESOPHAGOGASTRODUODENOSCOPY: SHX5428

## 2017-07-07 LAB — CBC
HEMATOCRIT: 20.4 % — AB (ref 35.0–47.0)
HEMOGLOBIN: 6.2 g/dL — AB (ref 12.0–16.0)
MCH: 25.7 pg — AB (ref 26.0–34.0)
MCHC: 30.5 g/dL — ABNORMAL LOW (ref 32.0–36.0)
MCV: 84.2 fL (ref 80.0–100.0)
Platelets: 163 10*3/uL (ref 150–440)
RBC: 2.42 MIL/uL — ABNORMAL LOW (ref 3.80–5.20)
RDW: 19.2 % — ABNORMAL HIGH (ref 11.5–14.5)
WBC: 10.8 10*3/uL (ref 3.6–11.0)

## 2017-07-07 LAB — CBC WITH DIFFERENTIAL/PLATELET
Basophils Absolute: 0.1 10*3/uL (ref 0–0.1)
Basophils Relative: 1 %
EOS ABS: 0 10*3/uL (ref 0–0.7)
Eosinophils Relative: 0 %
HCT: 22.8 % — ABNORMAL LOW (ref 35.0–47.0)
Hemoglobin: 7.2 g/dL — ABNORMAL LOW (ref 12.0–16.0)
LYMPHS ABS: 5.7 10*3/uL — AB (ref 1.0–3.6)
Lymphocytes Relative: 51 %
MCH: 26.5 pg (ref 26.0–34.0)
MCHC: 31.5 g/dL — AB (ref 32.0–36.0)
MCV: 84.2 fL (ref 80.0–100.0)
MONOS PCT: 4 %
Monocytes Absolute: 0.4 10*3/uL (ref 0.2–0.9)
Neutro Abs: 5 10*3/uL (ref 1.4–6.5)
Neutrophils Relative %: 44 %
PLATELETS: 141 10*3/uL — AB (ref 150–440)
RBC: 2.71 MIL/uL — ABNORMAL LOW (ref 3.80–5.20)
RDW: 17.7 % — AB (ref 11.5–14.5)
WBC: 11.2 10*3/uL — ABNORMAL HIGH (ref 3.6–11.0)

## 2017-07-07 LAB — COMPREHENSIVE METABOLIC PANEL
ALT: 18 U/L (ref 14–54)
ANION GAP: 11 (ref 5–15)
AST: 99 U/L — ABNORMAL HIGH (ref 15–41)
Albumin: 2.1 g/dL — ABNORMAL LOW (ref 3.5–5.0)
Alkaline Phosphatase: 124 U/L (ref 38–126)
BILIRUBIN TOTAL: 6.3 mg/dL — AB (ref 0.3–1.2)
BUN: <5 mg/dL — ABNORMAL LOW (ref 6–20)
CHLORIDE: 109 mmol/L (ref 101–111)
CO2: 21 mmol/L — ABNORMAL LOW (ref 22–32)
Calcium: 7.7 mg/dL — ABNORMAL LOW (ref 8.9–10.3)
Creatinine, Ser: 0.6 mg/dL (ref 0.44–1.00)
Glucose, Bld: 65 mg/dL (ref 65–99)
POTASSIUM: 4.3 mmol/L (ref 3.5–5.1)
Sodium: 141 mmol/L (ref 135–145)
TOTAL PROTEIN: 7.5 g/dL (ref 6.5–8.1)

## 2017-07-07 LAB — HAPTOGLOBIN: HAPTOGLOBIN: 138 mg/dL (ref 34–200)

## 2017-07-07 LAB — PREPARE RBC (CROSSMATCH)

## 2017-07-07 LAB — ABO/RH: ABO/RH(D): O POS

## 2017-07-07 LAB — VITAMIN B12: Vitamin B-12: 1773 pg/mL — ABNORMAL HIGH (ref 180–914)

## 2017-07-07 LAB — PROTIME-INR
INR: 1.58
PROTHROMBIN TIME: 18.7 s — AB (ref 11.4–15.2)

## 2017-07-07 LAB — HIV ANTIBODY (ROUTINE TESTING W REFLEX): HIV SCREEN 4TH GENERATION: NONREACTIVE

## 2017-07-07 LAB — MAGNESIUM: MAGNESIUM: 2.2 mg/dL (ref 1.7–2.4)

## 2017-07-07 LAB — PHOSPHORUS: PHOSPHORUS: 2.5 mg/dL (ref 2.5–4.6)

## 2017-07-07 SURGERY — EGD (ESOPHAGOGASTRODUODENOSCOPY)
Anesthesia: General

## 2017-07-07 MED ORDER — PANTOPRAZOLE SODIUM 40 MG PO TBEC: 40.0000 mg | DELAYED_RELEASE_TABLET | Freq: Two times a day (BID) | ORAL | 0 refills | Status: DC

## 2017-07-07 MED ORDER — THIAMINE HCL 100 MG PO TABS: 100.0000 mg | ORAL_TABLET | Freq: Every day | ORAL | Status: DC

## 2017-07-07 MED ORDER — PROPOFOL 500 MG/50ML IV EMUL
INTRAVENOUS | Status: DC | PRN
  Administered 2017-07-07: 80 ug/kg/min via INTRAVENOUS

## 2017-07-07 MED ORDER — SODIUM CHLORIDE 0.9 % IV SOLN
INTRAVENOUS | Status: DC
  Administered 2017-07-07: 1000 mL via INTRAVENOUS

## 2017-07-07 MED ORDER — PANTOPRAZOLE SODIUM 40 MG PO TBEC
40.0000 mg | DELAYED_RELEASE_TABLET | Freq: Two times a day (BID) | ORAL | Status: DC
  Administered 2017-07-07 – 2017-07-08 (×3): 40 mg via ORAL
  Filled 2017-07-07 (×4): qty 1

## 2017-07-07 MED ORDER — PROPOFOL 10 MG/ML IV BOLUS
INTRAVENOUS | Status: DC | PRN
  Administered 2017-07-07 (×3): 50 mg via INTRAVENOUS

## 2017-07-07 MED ORDER — ENSURE ENLIVE PO LIQD
237.0000 mL | Freq: Three times a day (TID) | ORAL | Status: DC
  Administered 2017-07-07: 237 mL via ORAL

## 2017-07-07 MED ORDER — SODIUM CHLORIDE 0.9 % IV SOLN
Freq: Once | INTRAVENOUS | Status: AC
  Administered 2017-07-07: 15:00:00 via INTRAVENOUS

## 2017-07-07 MED ORDER — PROPOFOL 500 MG/50ML IV EMUL
INTRAVENOUS | Status: AC
  Filled 2017-07-07: qty 50

## 2017-07-07 NOTE — Anesthesia Preprocedure Evaluation (Signed)
Anesthesia Evaluation  Patient identified by MRN, date of birth, ID band Patient awake    Reviewed: Allergy & Precautions, H&P , NPO status , Patient's Chart, lab work & pertinent test results, reviewed documented beta blocker date and time   Airway Mallampati: II   Neck ROM: full    Dental  (+) Poor Dentition   Pulmonary neg pulmonary ROS, former smoker,    Pulmonary exam normal        Cardiovascular Exercise Tolerance: Poor hypertension, On Medications negative cardio ROS Normal cardiovascular exam Rhythm:regular Rate:Normal     Neuro/Psych PSYCHIATRIC DISORDERS Anxiety negative neurological ROS  negative psych ROS   GI/Hepatic negative GI ROS, Neg liver ROS,   Endo/Other  negative endocrine ROS  Renal/GU negative Renal ROS  negative genitourinary   Musculoskeletal   Abdominal   Peds  Hematology negative hematology ROS (+) anemia ,   Anesthesia Other Findings Past Medical History: No date: Hypertension Past Surgical History: May 2016: TOOTH EXTRACTION BMI    Body Mass Index:  30.24 kg/m     Reproductive/Obstetrics negative OB ROS                             Anesthesia Physical Anesthesia Plan  ASA: II and emergent  Anesthesia Plan: General   Post-op Pain Management:    Induction:   PONV Risk Score and Plan:   Airway Management Planned:   Additional Equipment:   Intra-op Plan:   Post-operative Plan:   Informed Consent: I have reviewed the patients History and Physical, chart, labs and discussed the procedure including the risks, benefits and alternatives for the proposed anesthesia with the patient or authorized representative who has indicated his/her understanding and acceptance.   Dental Advisory Given  Plan Discussed with: CRNA  Anesthesia Plan Comments:         Anesthesia Quick Evaluation

## 2017-07-07 NOTE — Progress Notes (Signed)
[facility] [place] NAME: Mackenzie Little    MR#:  [id number]  DATE OF BIRTH:  Jul 04, 1965  SUBJECTIVE:   Patient without abdominal pain or dark colored stools Going for EGD this am No CP  REVIEW OF SYSTEMS:    Review of Systems  Constitutional: Negative for fever, chills weight loss HENT: Negative for ear pain, nosebleeds, congestion, facial swelling, rhinorrhea, neck pain, neck stiffness and ear discharge.   Respiratory: Negative for cough, shortness of breath, wheezing  Cardiovascular: Negative for chest pain, palpitations and leg swelling.  Gastrointestinal: Negative for heartburn, abdominal pain, vomiting, diarrhea or consitpation Genitourinary: Negative for dysuria, urgency, frequency, hematuria Musculoskeletal: Negative for back pain or joint pain Neurological: Negative for dizziness, seizures, syncope, focal weakness,  numbness and headaches.  Hematological: Does not bruise/bleed easily.  Psychiatric/Behavioral: Negative for hallucinations, confusion, dysphoric mood    Tolerating Diet: npo      DRUG ALLERGIES:  No Known Allergies  VITALS:  Blood pressure 117/86, pulse 89, temperature 98.6 F (37 C), temperature source Oral, resp. rate 14, height 5\' 5"  (1.651 m), weight 82.4 kg (181 lb 11.2 oz), SpO2 92 %.  PHYSICAL EXAMINATION:  Constitutional: Appears well-developed and well-nourished. No distress. HENT: Normocephalic. Marland Kitchen Oropharynx is clear and moist.  Eyes: Conjunctivae and EOM are normal. PERRLA, no scleral icterus.  Neck: Normal ROM. Neck supple. No JVD. No tracheal deviation. CVS: RRR, S1/S2 +, no murmurs, no gallops, no carotid bruit.  Pulmonary: Effort and breath sounds normal, no stridor, rhonchi, wheezes, rales.  Abdominal: Soft. BS +,  no distension, tenderness, rebound or guarding.  Musculoskeletal: Normal range of motion. No edema and no tenderness.  Neuro: Alert. CN 2-12 grossly intact. No focal  deficits. Skin: Skin is warm and dry. No rash noted. Psychiatric: Normal mood and affect.      LABORATORY PANEL:   CBC Recent Labs  Lab 07/06/17 0059  WBC 11.1*  HGB 7.5*  HCT 24.4*  PLT 207   ------------------------------------------------------------------------------------------------------------------  Chemistries  Recent Labs  Lab 07/07/17 0417  NA 141  K 4.3  CL 109  CO2 21*  GLUCOSE 65  BUN <5*  CREATININE 0.60  CALCIUM 7.7*  MG 2.2  AST 99*  ALT 18  ALKPHOS 124  BILITOT 6.3*   ------------------------------------------------------------------------------------------------------------------  Cardiac Enzymes Recent Labs  Lab 07/06/17 0059  TROPONINI <0.03   ------------------------------------------------------------------------------------------------------------------  RADIOLOGY:  Dg Chest 2 View  Result Date: 07/06/2017 CLINICAL DATA:  Chest discomfort worsening tonight. EXAM: CHEST - 2 VIEW COMPARISON:  None. FINDINGS: Borderline cardiomegaly. No aortic aneurysm. Both lungs are clear. The visualized skeletal structures are unremarkable. IMPRESSION: No active cardiopulmonary disease. Electronically Signed   By: Ashley Royalty M.D.   On: 07/06/2017 01:52   Ct Abdomen Pelvis W Contrast  Result Date: 07/06/2017 CLINICAL DATA:  Sharp central and left-sided chest pain. Nausea and vomiting for 2 days. EXAM: CT ABDOMEN AND PELVIS WITH CONTRAST TECHNIQUE: Multidetector CT imaging of the abdomen and pelvis was performed using the standard protocol following bolus administration of intravenous contrast. CONTRAST:  140mL OMNIPAQUE IOHEXOL 300 MG/ML  SOLN COMPARISON:  12/27/2008 pelvic ultrasound FINDINGS: Lower chest: Cardiomegaly without pericardial effusion or thickening. Clear lung bases. Hepatobiliary: Hepatomegaly with hepatic steatosis. Mild surface nodularity consistent with morphologic changes of cirrhosis. No space-occupying mass. Nondistended gallbladder  with gallstones. Pancreas: No enhancing mass, ductal dilatation or inflammation. Spleen: The spleen measures 15.7 x 4.1 x 11.2 cm (volume = 380 cm^3) and is free  of focal mass. Adrenals/Urinary Tract: Normal bilateral adrenal glands. Mild irregular cortical thinning of the right kidney. No nephrolithiasis hydroureteronephrosis. The urinary bladder is unremarkable for the degree of distention. Stomach/Bowel: Stomach is within normal limits. Appendix appears normal. No evidence of bowel wall thickening, distention, or inflammatory changes. Vascular/Lymphatic: No significant vascular findings are present. No enlarged abdominal or pelvic lymph nodes. Reproductive: 5 cm fundal leiomyoma.  No adnexal mass. Other: No free air nor free fluid. Musculoskeletal: No aggressive osseous lesions. No acute osseous abnormality. IMPRESSION: 1. Hepatomegaly with hepatic steatosis. Morphologic changes of cirrhosis noted without space-occupying mass. 2. The included heart is enlarged without pericardial effusion or thickening. 3. Fibroid uterus. Electronically Signed   By: Ashley Royalty M.D.   On: 07/06/2017 03:24   Mr 3d Recon At Scanner  Result Date: 07/06/2017 CLINICAL DATA:  Abdominal pain.  Abnormal liver function test. EXAM: MRI ABDOMEN WITHOUT AND WITH CONTRAST (INCLUDING MRCP) TECHNIQUE: Multiplanar multisequence MR imaging of the abdomen was performed both before and after the administration of intravenous contrast. Heavily T2-weighted images of the biliary and pancreatic ducts were obtained, and three-dimensional MRCP images were rendered by post processing. CONTRAST:  72mL MULTIHANCE GADOBENATE DIMEGLUMINE 529 MG/ML IV SOLN COMPARISON:  07/06/2017 FINDINGS: Lower chest: No acute findings. Hepatobiliary: There is marked diffuse hepatic steatosis. The liver appears enlarged measuring 28 cm in length. The contour of the liver is slightly irregular. Small arterial phase enhancing structure within segment 8 measures 8 mm, image  37/19. This is T2 hyperintense and exhibits early arterial phase enhancement that persists on the delayed images. No washout associated with this lesion. Multiple stones identified within the neck of gallbladder which measure up to 5 mm. No gallbladder wall thickening or pericholecystic fluid. No biliary dilatation identified. Pancreas: Unremarkable appearance of the pancreas. No inflammation or mass. No main duct dilatation. Spleen: The spleen measures 12.1 by 11.8 by 5.6 cm (volume = 420 cm^3). Adrenals/Urinary Tract: Normal adrenal glands. Unremarkable appearance of the left kidney. Scarring and volume loss from the right kidney noted. No mass or hydronephrosis identified. Stomach/Bowel: The stomach is normal. The visualized upper abdominal bowel loops are unremarkable. Vascular/Lymphatic: No pathologically enlarged lymph nodes identified. No enlarged lymph nodes identified within the upper abdomen. Other:  No ascites. Musculoskeletal: No suspicious bone lesions identified. IMPRESSION: 1. Morphologic features of liver compatible with cirrhosis. Hepatomegaly and hepatic steatosis noted. 2. Arterial phase enhancing structure is identified within segment 8. In a patient that is at increased risk for hepatoma this is considered a LR-3: Intermediate probability for [facility]. Followup imaging in 6 months with repeat contrast enhanced MRI of the liver is recommended. 3. Borderline splenomegaly. 4. Gallstones. Electronically Signed   By: Kerby Moors M.D.   On: 07/06/2017 13:08   Mr Abdomen Mrcp Moise Boring Contast  Result Date: 07/06/2017 CLINICAL DATA:  Abdominal pain.  Abnormal liver function test. EXAM: MRI ABDOMEN WITHOUT AND WITH CONTRAST (INCLUDING MRCP) TECHNIQUE: Multiplanar multisequence MR imaging of the abdomen was performed both before and after the administration of intravenous contrast. Heavily T2-weighted images of the biliary and pancreatic ducts were obtained, and three-dimensional MRCP images were rendered  by post processing. CONTRAST:  69mL MULTIHANCE GADOBENATE DIMEGLUMINE 529 MG/ML IV SOLN COMPARISON:  07/06/2017 FINDINGS: Lower chest: No acute findings. Hepatobiliary: There is marked diffuse hepatic steatosis. The liver appears enlarged measuring 28 cm in length. The contour of the liver is slightly irregular. Small arterial phase enhancing structure within segment 8 measures 8 mm, image 37/19. This  is T2 hyperintense and exhibits early arterial phase enhancement that persists on the delayed images. No washout associated with this lesion. Multiple stones identified within the neck of gallbladder which measure up to 5 mm. No gallbladder wall thickening or pericholecystic fluid. No biliary dilatation identified. Pancreas: Unremarkable appearance of the pancreas. No inflammation or mass. No main duct dilatation. Spleen: The spleen measures 12.1 by 11.8 by 5.6 cm (volume = 420 cm^3). Adrenals/Urinary Tract: Normal adrenal glands. Unremarkable appearance of the left kidney. Scarring and volume loss from the right kidney noted. No mass or hydronephrosis identified. Stomach/Bowel: The stomach is normal. The visualized upper abdominal bowel loops are unremarkable. Vascular/Lymphatic: No pathologically enlarged lymph nodes identified. No enlarged lymph nodes identified within the upper abdomen. Other:  No ascites. Musculoskeletal: No suspicious bone lesions identified. IMPRESSION: 1. Morphologic features of liver compatible with cirrhosis. Hepatomegaly and hepatic steatosis noted. 2. Arterial phase enhancing structure is identified within segment 8. In a patient that is at increased risk for hepatoma this is considered a LR-3: Intermediate probability for [facility]. Followup imaging in 6 months with repeat contrast enhanced MRI of the liver is recommended. 3. Borderline splenomegaly. 4. Gallstones. Electronically Signed   By: Kerby Moors M.D.   On: 07/06/2017 13:08   US Abdomen Limited Ruq  Result Date:  07/06/2017 CLINICAL DATA:  Jaundice. EXAM: ULTRASOUND ABDOMEN LIMITED RIGHT UPPER QUADRANT COMPARISON:  Abdominopelvic CT earlier this day. FINDINGS: Gallbladder: Partially distended with multiple gallstones. Mild gallbladder wall thickening and wall edema, maximal thickness 3.3 mm. No sonographic Murphy sign noted by sonographer. Common bile duct: Diameter: 9 mm at the porta hepatis. Liver: No focal lesion identified. Diffusely increased and heterogeneous in echogenicity. Liver is enlarged. Portal vein is patent on color Doppler imaging with normal direction of blood flow towards the liver. IMPRESSION: 1. Hepatomegaly and hepatic steatosis. 2. Partially distended gallbladder with multiple gallstones. Mild gallbladder wall thickening is nonspecific and may be secondary to chronic liver disease or chronic cholecystitis. No sonographic Murphy sign. 3. Mildly dilated common bile duct 9 mm of uncertain etiology. This is not confirmed on CT earlier today and may be technical. Electronically Signed   By: Jeb Levering M.D.   On: 07/06/2017 05:05     ASSESSMENT AND PLAN:   52 year old female with history of EtOH abuse who presented with atypical chest pain and abdominal pain.  1.  Abdominal pain: This is due to cholestatic hepatitis with cirrhosis and hepatomegaly.  MRCP was negative for CBD stone  2.  Acute on chronic anemia: Patient is plan for EGD and colonoscopy. She should avoid NSAIDs She will continue PPI twice daily. Anemia panel is consistent with chronic disease 3.  EtOH abuse: Patient is on CIWA protocol with uneventful detox  4.  Liver lesion on MRCP: Follow-up on AFP Patient will need outpatient GI follow-up  5.  Hypokalemia: This was repleted and has improved  Management plans discussed with the patient and she is in agreement.  CODE STATUS: Full  TOTAL TIME TAKING CARE OF THIS PATIENT: 22 minutes.     POSSIBLE D/C today or tomorrow, DEPENDING ON EGD and colonoscopy   Dexter Signor,  Alexanderia Gorby M.D on 07/07/2017 at 11:28 AM  Between 7am to 6pm - Pager - [phone] After 6pm go to [email or web address] - password [email or web address] [facility]  Office  [phone]  CC: Primary care physician; Patient, No Pcp Per  Note: This dictation was prepared with Dragon dictation along with smaller phrase technology. Any transcriptional errors  that result from this process are unintentional.

## 2017-07-07 NOTE — Op Note (Signed)
[facility] Patient Name: Mackenzie Little Procedure Date: 07/07/2017 10:53 AM MRN: [id number] Account #: [id number] Date of Birth: January 15, 1966 Admit Type: Inpatient Age: 52 Room: [facility] ENDO ROOM 1 Gender: Female Note Status: Finalized Procedure:            Colonoscopy Indications:          Rectal bleeding, Iron deficiency anemia secondary to                        chronic blood loss Providers:            Lin Landsman MD, MD Referring MD:         No Local Md, MD (Referring MD) Medicines:            Monitored Anesthesia Care Complications:        No immediate complications. Estimated blood loss: None. Procedure:            Pre-Anesthesia Assessment:                       - Prior to the procedure, a History and Physical was                        performed, and patient medications and allergies were                        reviewed. The patient is competent. The risks and                        benefits of the procedure and the sedation options and                        risks were discussed with the patient. All questions                        were answered and informed consent was obtained.                        Patient identification and proposed procedure were                        verified by the physician, the nurse, the                        anesthesiologist, the anesthetist and the technician in                        the pre-procedure area in the procedure room in the                        endoscopy suite. Mental Status Examination: alert and                        oriented. Airway Examination: normal oropharyngeal                        airway and neck mobility. Respiratory Examination:                        clear to auscultation. CV Examination: normal.  Prophylactic Antibiotics: The patient does not require                        prophylactic antibiotics. Prior Anticoagulants: The   patient has taken no previous anticoagulant or                        antiplatelet agents. ASA Grade Assessment: IV - A                        patient with severe systemic disease that is a constant                        threat to life. After reviewing the risks and benefits,                        the patient was deemed in satisfactory condition to                        undergo the procedure. The anesthesia plan was to use                        monitored anesthesia care (MAC). Immediately prior to                        administration of medications, the patient was                        re-assessed for adequacy to receive sedatives. The                        heart rate, respiratory rate, oxygen saturations, blood                        pressure, adequacy of pulmonary ventilation, and                        response to care were monitored throughout the                        procedure. The physical status of the patient was                        re-assessed after the procedure.                       After obtaining informed consent, the colonoscope was                        passed under direct vision. Throughout the procedure,                        the patient's blood pressure, pulse, and oxygen                        saturations were monitored continuously. The                        Colonoscope was introduced through the anus and  advanced to the the cecum, identified by appendiceal                        orifice and ileocecal valve. The colonoscopy was                        performed without difficulty. The patient tolerated the                        procedure fairly well. The quality of the bowel                        preparation was evaluated using the BBPS [facility] Bowel                        Preparation Scale) with scores of: Right Colon = 3,                        Transverse Colon = 3 and Left Colon = 3 (entire mucosa                        seen  well with no residual staining, small fragments of                        stool or opaque liquid). The total BBPS score equals 9. Findings:      The perianal and digital rectal examinations were normal. Pertinent       negatives include normal sphincter tone and no palpable rectal lesions.      A 10 mm polyp was found in the proximal ascending colon. The polyp was       sessile. The polyp was removed with a hot snare. Resection and retrieval       were complete.      Four sessile polyps were found in the sigmoid colon and descending       colon. The polyps were 3 to 5 mm in size. These polyps were removed with       a cold snare. Resection and retrieval were complete.      Non-bleeding internal hemorrhoids were found during retroflexion. The       hemorrhoids were large. Impression:           - One 10 mm polyp in the proximal ascending colon,                        removed with a hot snare. Resected and retrieved.                       - Four 3 to 5 mm polyps in the sigmoid colon and in the                        descending colon, removed with a cold snare. Resected                        and retrieved.                       - Non-bleeding internal hemorrhoids. Recommendation:       - Return patient to hospital ward for ongoing care.                       -  Resume previous diet today.                       - Continue present medications.                       - Await pathology results.                       - Repeat colonoscopy in 3 years for surveillance of                        multiple polyps.                       - Return to my office in 2 weeks. Procedure Code(s):    --- Professional ---                       [phone], Colonoscopy, flexible; with removal of tumor(s),                        polyp(s), or other lesion(s) by snare technique Diagnosis Code(s):    --- Professional ---                       D12.2, Benign neoplasm of ascending colon                       D12.5, Benign  neoplasm of sigmoid colon                       D12.4, Benign neoplasm of descending colon                       K64.8, Other hemorrhoids                       K62.5, Hemorrhage of anus and rectum                       D50.0, Iron deficiency anemia secondary to blood loss                        (chronic) CPT copyright 2017 [employer]. All rights reserved. The codes documented in this report are preliminary and upon coder review may  be revised to meet current compliance requirements. Dr. Ulyess Mort Lin Landsman MD, MD 07/07/2017 11:40:24 AM This report has been signed electronically. Number of Addenda: 0 Note Initiated On: 07/07/2017 10:53 AM Scope Withdrawal Time: 0 hours 14 minutes 34 seconds  Total Procedure Duration: 0 hours 17 minutes 56 seconds       [facility]

## 2017-07-07 NOTE — Discharge Summary (Addendum)
[place] [facility] [place] NAME: Mackenzie Little    MR#:  [id number]  DATE OF BIRTH:  09-10-1965  DATE OF ADMISSION:  07/06/2017 ADMITTING PHYSICIAN: Arta Silence, MD  DATE OF DISCHARGE: 07/08/2017  PRIMARY CARE PHYSICIAN: Tandy Gaw, PA    ADMISSION DIAGNOSIS:  Choledocholithiasis [K80.50] Hypokalemia [E87.6] Jaundice [R17] Anemia, unspecified type [D64.9] Chest pain, unspecified type [R07.9]  DISCHARGE DIAGNOSIS:  Active Problems:   Chest pain   SECONDARY DIAGNOSIS:   Past Medical History:  Diagnosis Date  . Hypertension     HOSPITAL COURSE:    52 year old female with history of EtOH abuse who presented with atypical chest pain and abdominal pain.  1.  Abdominal pain: This is due to cholestatic hepatitis with cirrhosis and hepatomegaly.  MRCP was negative for CBD stone  2.  Acute on chronic anemia: EGD shows   - Multiple non-bleeding duodenal ulcers with a clean                        ulcer base (Forrest Class III).                       - Normal second portion of the duodenum.                       - Non-bleeding erosive gastropathy. Biopsied.                       - Portal hypertensive gastropathy. Biopsied.                       - LA Grade A reflux esophagitis  She should avoid NSAIDs and this was discussed with her.  Colonoscopy shows internal hemorrhoids and polyps.  Biopsies were taken.  She will need to continue PPI twice daily. Anemia panel is consistent with chronic disease  She is status post 2 unit PRBC.  She will have outpatient follow-up with GI.   3.  EtOH abuse: She had uneventful detox.  She was on CIWA protocol. She is advised to stop drinking EtOH.   4.  Liver lesion on MRCP: AFP was within normal limits. Patient will need outpatient GI follow-up  5.  Hypokalemia: This was repleted and has improved    DISCHARGE CONDITIONS AND DIET:   Stable for discharge on heart healthy  diet  CONSULTS OBTAINED:  Treatment Team:  Arta Silence, MD Lin Landsman, MD  DRUG ALLERGIES:  No Known Allergies  DISCHARGE MEDICATIONS:   Allergies as of 07/08/2017   No Known Allergies     Medication List    STOP taking these medications   hydrochlorothiazide 12.5 MG capsule Commonly known as:  MICROZIDE   naproxen 500 MG tablet Commonly known as:  NAPROSYN   sertraline 25 MG tablet Commonly known as:  ZOLOFT   traMADol 50 MG tablet Commonly known as:  ULTRAM     TAKE these medications   folic acid 1 MG tablet Commonly known as:  FOLVITE Take 1 tablet (1 mg total) by mouth daily. Start taking on:  07/09/2017   ondansetron 4 MG disintegrating tablet Commonly known as:  ZOFRAN ODT Take 1 tablet (4 mg total) by mouth every 8 (eight) hours as needed for nausea or vomiting.   pantoprazole 40 MG tablet Commonly known as:  PROTONIX Take 1 tablet (40 mg total) by mouth 2 (two)  times daily.   thiamine 100 MG tablet Take 1 tablet (100 mg total) by mouth daily.         Today   CHIEF COMPLAINT:   Patient is ready to go home today.  Denies shortness of breath or chest pain.  Denies melena or hematochezia.   VITAL SIGNS:  Blood pressure 116/61, pulse 84, temperature 98.6 F (37 C), resp. rate 18, height 5\' 5"  (1.651 m), weight 82.4 kg (181 lb 11.2 oz), SpO2 99 %.   REVIEW OF SYSTEMS:  Review of Systems  Constitutional: Negative.  Negative for chills, fever and malaise/fatigue.  HENT: Negative.  Negative for ear discharge, ear pain, hearing loss, nosebleeds and sore throat.   Eyes: Negative.  Negative for blurred vision and pain.  Respiratory: Negative.  Negative for cough, hemoptysis, shortness of breath and wheezing.   Cardiovascular: Negative.  Negative for chest pain, palpitations and leg swelling.  Gastrointestinal: Negative.  Negative for abdominal pain, blood in stool, diarrhea, nausea and vomiting.  Genitourinary: Negative.  Negative  for dysuria.  Musculoskeletal: Negative.  Negative for back pain.  Skin: Negative.   Neurological: Negative for dizziness, tremors, speech change, focal weakness, seizures and headaches.  Endo/Heme/Allergies: Negative.  Does not bruise/bleed easily.  Psychiatric/Behavioral: Negative.  Negative for depression, hallucinations and suicidal ideas.     PHYSICAL EXAMINATION:  GENERAL:  52 y.o.-year-old patient lying in the bed with no acute distress.  NECK:  Supple, no jugular venous distention. No thyroid enlargement, no tenderness.  LUNGS: Normal breath sounds bilaterally, no wheezing, rales,rhonchi  No use of accessory muscles of respiration.  CARDIOVASCULAR: S1, S2 normal. No murmurs, rubs, or gallops.  ABDOMEN: Soft, non-tender, non-distended. Bowel sounds present. No organomegaly or mass.  EXTREMITIES: No pedal edema, cyanosis, or clubbing.  PSYCHIATRIC: The patient is alert and oriented x 3.  SKIN: No obvious rash, lesion, or ulcer.   DATA REVIEW:   CBC Recent Labs  Lab 07/08/17 0556  WBC 11.0  HGB 7.1*  HCT 22.6*  PLT 157    Chemistries  Recent Labs  Lab 07/07/17 0417 07/08/17 0556  NA 141 139  K 4.3 4.5  CL 109 108  CO2 21* 23  GLUCOSE 65 74  BUN <5* <5*  CREATININE 0.60 0.51  CALCIUM 7.7* 7.8*  MG 2.2 1.9  AST 99*  --   ALT 18  --   ALKPHOS 124  --   BILITOT 6.3*  --     Cardiac Enzymes Recent Labs  Lab 07/06/17 0059  TROPONINI <0.03    Microbiology Results  @[email or web address] @  RADIOLOGY:  Mr 3d Recon At Scanner  Result Date: 07/06/2017 CLINICAL DATA:  Abdominal pain.  Abnormal liver function test. EXAM: MRI ABDOMEN WITHOUT AND WITH CONTRAST (INCLUDING MRCP) TECHNIQUE: Multiplanar multisequence MR imaging of the abdomen was performed both before and after the administration of intravenous contrast. Heavily T2-weighted images of the biliary and pancreatic ducts were obtained, and three-dimensional MRCP images were rendered by post processing. CONTRAST:   65mL MULTIHANCE GADOBENATE DIMEGLUMINE 529 MG/ML IV SOLN COMPARISON:  07/06/2017 FINDINGS: Lower chest: No acute findings. Hepatobiliary: There is marked diffuse hepatic steatosis. The liver appears enlarged measuring 28 cm in length. The contour of the liver is slightly irregular. Small arterial phase enhancing structure within segment 8 measures 8 mm, image 37/19. This is T2 hyperintense and exhibits early arterial phase enhancement that persists on the delayed images. No washout associated with this lesion. Multiple stones identified within the neck of gallbladder which measure  up to 5 mm. No gallbladder wall thickening or pericholecystic fluid. No biliary dilatation identified. Pancreas: Unremarkable appearance of the pancreas. No inflammation or mass. No main duct dilatation. Spleen: The spleen measures 12.1 by 11.8 by 5.6 cm (volume = 420 cm^3). Adrenals/Urinary Tract: Normal adrenal glands. Unremarkable appearance of the left kidney. Scarring and volume loss from the right kidney noted. No mass or hydronephrosis identified. Stomach/Bowel: The stomach is normal. The visualized upper abdominal bowel loops are unremarkable. Vascular/Lymphatic: No pathologically enlarged lymph nodes identified. No enlarged lymph nodes identified within the upper abdomen. Other:  No ascites. Musculoskeletal: No suspicious bone lesions identified. IMPRESSION: 1. Morphologic features of liver compatible with cirrhosis. Hepatomegaly and hepatic steatosis noted. 2. Arterial phase enhancing structure is identified within segment 8. In a patient that is at increased risk for hepatoma this is considered a LR-3: Intermediate probability for [facility]. Followup imaging in 6 months with repeat contrast enhanced MRI of the liver is recommended. 3. Borderline splenomegaly. 4. Gallstones. Electronically Signed   By: Kerby Moors M.D.   On: 07/06/2017 13:08   Mr Abdomen Mrcp Moise Boring Contast  Result Date: 07/06/2017 CLINICAL DATA:  Abdominal pain.   Abnormal liver function test. EXAM: MRI ABDOMEN WITHOUT AND WITH CONTRAST (INCLUDING MRCP) TECHNIQUE: Multiplanar multisequence MR imaging of the abdomen was performed both before and after the administration of intravenous contrast. Heavily T2-weighted images of the biliary and pancreatic ducts were obtained, and three-dimensional MRCP images were rendered by post processing. CONTRAST:  51mL MULTIHANCE GADOBENATE DIMEGLUMINE 529 MG/ML IV SOLN COMPARISON:  07/06/2017 FINDINGS: Lower chest: No acute findings. Hepatobiliary: There is marked diffuse hepatic steatosis. The liver appears enlarged measuring 28 cm in length. The contour of the liver is slightly irregular. Small arterial phase enhancing structure within segment 8 measures 8 mm, image 37/19. This is T2 hyperintense and exhibits early arterial phase enhancement that persists on the delayed images. No washout associated with this lesion. Multiple stones identified within the neck of gallbladder which measure up to 5 mm. No gallbladder wall thickening or pericholecystic fluid. No biliary dilatation identified. Pancreas: Unremarkable appearance of the pancreas. No inflammation or mass. No main duct dilatation. Spleen: The spleen measures 12.1 by 11.8 by 5.6 cm (volume = 420 cm^3). Adrenals/Urinary Tract: Normal adrenal glands. Unremarkable appearance of the left kidney. Scarring and volume loss from the right kidney noted. No mass or hydronephrosis identified. Stomach/Bowel: The stomach is normal. The visualized upper abdominal bowel loops are unremarkable. Vascular/Lymphatic: No pathologically enlarged lymph nodes identified. No enlarged lymph nodes identified within the upper abdomen. Other:  No ascites. Musculoskeletal: No suspicious bone lesions identified. IMPRESSION: 1. Morphologic features of liver compatible with cirrhosis. Hepatomegaly and hepatic steatosis noted. 2. Arterial phase enhancing structure is identified within segment 8. In a patient that is  at increased risk for hepatoma this is considered a LR-3: Intermediate probability for [facility]. Followup imaging in 6 months with repeat contrast enhanced MRI of the liver is recommended. 3. Borderline splenomegaly. 4. Gallstones. Electronically Signed   By: Kerby Moors M.D.   On: 07/06/2017 13:08      Allergies as of 07/08/2017   No Known Allergies     Medication List    STOP taking these medications   hydrochlorothiazide 12.5 MG capsule Commonly known as:  MICROZIDE   naproxen 500 MG tablet Commonly known as:  NAPROSYN   sertraline 25 MG tablet Commonly known as:  ZOLOFT   traMADol 50 MG tablet Commonly known as:  Veatrice Bourbon  TAKE these medications   folic acid 1 MG tablet Commonly known as:  FOLVITE Take 1 tablet (1 mg total) by mouth daily. Start taking on:  07/09/2017   ondansetron 4 MG disintegrating tablet Commonly known as:  ZOFRAN ODT Take 1 tablet (4 mg total) by mouth every 8 (eight) hours as needed for nausea or vomiting.   pantoprazole 40 MG tablet Commonly known as:  PROTONIX Take 1 tablet (40 mg total) by mouth 2 (two) times daily.   thiamine 100 MG tablet Take 1 tablet (100 mg total) by mouth daily.         Management plans discussed with the patient and she is in agreement. Stable for discharge home  Patient should follow up with GI   CODE STATUS:     Code Status Orders  (From admission, onward)        Start     Ordered   07/06/17 0710  Full code  Continuous     07/06/17 0709    Code Status History    This patient has a current code status but no historical code status.    Advance Directive Documentation     Most Recent Value  Type of Advance Directive  Healthcare Power of Attorney  Pre-existing out of facility DNR order (yellow form or pink MOST form)  -  "MOST" Form in Place?  -      TOTAL TIME TAKING CARE OF THIS PATIENT: 38 minutes.    Note: This dictation was prepared with Dragon dictation along with smaller phrase  technology. Any transcriptional errors that result from this process are unintentional.  Wyndham Santilli M.D on 07/08/2017 at 10:53 AM  Between 7am to 6pm - Pager - [phone] After 6pm go to [email or web address] - password [email or web address] [facility]  Office  [phone]  CC: Primary care physician; Tandy Gaw, [place]

## 2017-07-07 NOTE — Anesthesia Post-op Follow-up Note (Signed)
Anesthesia QCDR form completed.        

## 2017-07-07 NOTE — Op Note (Signed)
[facility] Gastroenterology Patient Name: Mackenzie Little Procedure Date: 07/07/2017 10:53 AM MRN: [id number] Account #: [id number] Date of Birth: 1965/09/04 Admit Type: Inpatient Age: 52 Room: [facility] ENDO ROOM 1 Gender: Female Note Status: Finalized Procedure:            Upper GI endoscopy Indications:          Iron deficiency anemia secondary to chronic blood loss,                        Early satiety, Failure to thrive, Nausea with vomiting Providers:            Lin Landsman MD, MD Referring MD:         No Local Md, MD (Referring MD) Medicines:            Monitored Anesthesia Care Complications:        No immediate complications. Estimated blood loss:                        Minimal. Procedure:            Pre-Anesthesia Assessment:                       - Prior to the procedure, a History and Physical was                        performed, and patient medications and allergies were                        reviewed. The patient is competent. The risks and                        benefits of the procedure and the sedation options and                        risks were discussed with the patient. All questions                        were answered and informed consent was obtained.                        Patient identification and proposed procedure were                        verified by the physician, the nurse, the                        anesthesiologist, the anesthetist and the technician in                        the pre-procedure area in the procedure room in the                        endoscopy suite. Mental Status Examination: alert and                        oriented. Airway Examination: normal oropharyngeal                        airway and neck mobility. Respiratory Examination:  clear to auscultation. CV Examination: normal.                        Prophylactic Antibiotics: The patient does not require   prophylactic antibiotics. Prior Anticoagulants: The                        patient has taken no previous anticoagulant or                        antiplatelet agents. ASA Grade Assessment: IV - A                        patient with severe systemic disease that is a constant                        threat to life. After reviewing the risks and benefits,                        the patient was deemed in satisfactory condition to                        undergo the procedure. The anesthesia plan was to use                        monitored anesthesia care (MAC). Immediately prior to                        administration of medications, the patient was                        re-assessed for adequacy to receive sedatives. The                        heart rate, respiratory rate, oxygen saturations, blood                        pressure, adequacy of pulmonary ventilation, and                        response to care were monitored throughout the                        procedure. The physical status of the patient was                        re-assessed after the procedure.                       After obtaining informed consent, the endoscope was                        passed under direct vision. Throughout the procedure,                        the patient's blood pressure, pulse, and oxygen                        saturations were monitored continuously. The Endoscope  was introduced through the mouth, and advanced to the                        second part of duodenum. The upper GI endoscopy was                        accomplished without difficulty. The patient tolerated                        the procedure fairly well. Findings:      Three non-bleeding superficial duodenal ulcers with a clean ulcer base       (Forrest Class III) were found in the duodenal bulb. The largest lesion       was 8 mm in largest dimension.      The second portion of the duodenum was normal.      A few  dispersed, diminutive non-bleeding erosions were found in the       gastric antrum. There were stigmata of recent bleeding. Biopsies were       taken with a cold forceps for Helicobacter pylori testing.      Mild portal hypertensive gastropathy was found in the entire examined       stomach. Biopsies were taken with a cold forceps for Helicobacter pylori       testing.      LA Grade A (one or more mucosal breaks less than 5 mm, not extending       between tops of 2 mucosal folds) esophagitis with no bleeding was found       in the lower third of the esophagus. Impression:           - Multiple non-bleeding duodenal ulcers with a clean                        ulcer base (Forrest Class III).                       - Normal second portion of the duodenum.                       - Non-bleeding erosive gastropathy. Biopsied.                       - Portal hypertensive gastropathy. Biopsied.                       - LA Grade A reflux esophagitis. Recommendation:       - Await pathology results.                       - Use a proton pump inhibitor PO BID for 3 months.                       - Proceed with colonoscopy as scheduled                       See colonoscopy report Procedure Code(s):    --- Professional ---                       [phone], Esophagogastroduodenoscopy, flexible, transoral;  with biopsy, single or multiple Diagnosis Code(s):    --- Professional ---                       K26.9, Duodenal ulcer, unspecified as acute or chronic,                        without hemorrhage or perforation                       K31.89, Other diseases of stomach and duodenum                       K76.6, Portal hypertension                       K21.0, Gastro-esophageal reflux disease with esophagitis                       D50.0, Iron deficiency anemia secondary to blood loss                        (chronic)                       R68.81, Early satiety                       R62.7, Adult  failure to thrive                       R11.2, Nausea with vomiting, unspecified CPT copyright 2017 American Medical Association. All rights reserved. The codes documented in this report are preliminary and upon coder review may  be revised to meet current compliance requirements. Dr. Ulyess Mort Lin Landsman MD, MD 07/07/2017 11:16:43 AM This report has been signed electronically. Number of Addenda: 0 Note Initiated On: 07/07/2017 10:53 AM      [facility]

## 2017-07-07 NOTE — Progress Notes (Signed)
Pharmacy Electrolyte Monitoring Consult:  Pharmacy consulted to assist in monitoring and replacing electrolytes in this 52 y.o. female admitted on 07/06/2017 with Chest Pain   Labs:  Sodium (mmol/L)  Date Value  07/07/2017 141   Potassium (mmol/L)  Date Value  07/07/2017 4.3   Magnesium (mg/dL)  Date Value  07/07/2017 2.2   Phosphorus (mg/dL)  Date Value  07/07/2017 2.5   Calcium (mg/dL)  Date Value  07/07/2017 7.7 (L)   Albumin (g/dL)  Date Value  07/07/2017 2.1 (L)    Assessment/Plan: K 4.3  Mag 2.2  Phos 2.5 Patient on Lactated Ringers with KCL 40 meq/L IVF @ 125 ml/hr. No supplementation at this time.  Will recheck all electrolytes tomorrow with AM labs.   Mackenzie Little A, Pharm.D., BCPS Clinical Pharmacist 07/07/2017 7:33 AM

## 2017-07-07 NOTE — Transfer of Care (Signed)
Immediate Anesthesia Transfer of Care Note  Patient: Mackenzie Little  Procedure(s) Performed: ESOPHAGOGASTRODUODENOSCOPY (EGD) (N/A ) COLONOSCOPY (N/A )  Patient Location: PACU  Anesthesia Type:General  Level of Consciousness: awake  Airway & Oxygen Therapy: Patient Spontanous Breathing  Post-op Assessment: Report given to RN  Post vital signs: stable  Last Vitals:  Vitals Value Taken Time  BP 104/69 07/07/2017 11:43 AM  Temp    Pulse 90 07/07/2017 11:43 AM  Resp 23 07/07/2017 11:43 AM  SpO2 100 % 07/07/2017 11:43 AM  Vitals shown include unvalidated device data.  Last Pain:  Vitals:   07/07/17 0755  TempSrc: Oral  PainSc:          Complications: No apparent anesthesia complications

## 2017-07-07 NOTE — Progress Notes (Signed)
Initial Nutrition Assessment  DOCUMENTATION CODES:   Not applicable  INTERVENTION:   Ensure Enlive po BID, each supplement provides 350 kcal and 20 grams of protein  MVI daily  Thiamine 100mg po daily  Folic acid 1mg po daily  NUTRITION DIAGNOSIS:   Inadequate oral intake related to acute illness as evidenced by per patient/family report.  GOAL:   Patient will meet greater than or equal to 90% of their needs  MONITOR:   PO intake, Supplement acceptance, Labs, Weight trends, I & O's  REASON FOR ASSESSMENT:   Malnutrition Screening Tool    ASSESSMENT:   52 y.o. African-American female with history of hypertension, heavy alcohol and NSAID use, who initially presented with left-sided chest pain which was self-limited and resolved, cardiac etiology ruled out. GI consulted for incidental findings of cholestatic hepatitis with cirrhosis, hepatomegaly. MRCP also revealed 8 mm arterial enhancing lesion in the liver.    Met with pt in room today. Pt reports poor appetite and oral intake at baseline likely r/t etoh abuse. Pt reports her appetite is fair today. Pt did eat some of her breakfast today and ate ~25% of her lunch tray. Pt reports her UBW is around 206lbs; per chart, pt has lost 25lbs(12%) but RD is unsure of the time frame in which the weight was lost. Pt is willing to try vanilla Ensure while hospitalized. Pt ordered for MVI, thiamine, and folic acid. Pt s/p EGD and colonoscopy today; found to have duodenal ulcers, esophagitis, gastropathy, polyps, and hemorrhoids. Pt likely at moderate refeeding risk; recommend monitor K, Mg, and P labs.   Medications reviewed and include: aspirin, folic acid, heparin, protonix, zofran, senokot, LRS @125ml/hr  Labs reviewed: K 4.3 wnl, BUN <5(L), Ca 7.7(L) adj. 9.22 wnl, P 2.5 wnl, Mg 2.2 wnl, alb 2.1(L), tbili 6.3(H) Folate- 5.7(L)- 6/10  B12- 1773- 6/10 Hgb 6.2(L), Hct 20.4(L), MCH 25.7(L), MCHC 30.5(L) cbgs- 84, 65 x 24  hrs  NUTRITION - FOCUSED PHYSICAL EXAM:    Most Recent Value  Orbital Region  No depletion  Upper Arm Region  No depletion  Thoracic and Lumbar Region  No depletion  Buccal Region  No depletion  Temple Region  No depletion  Clavicle Bone Region  No depletion  Clavicle and Acromion Bone Region  No depletion  Scapular Bone Region  No depletion  Dorsal Hand  No depletion  Patellar Region  No depletion  Anterior Thigh Region  No depletion  Posterior Calf Region  No depletion  Edema (RD Assessment)  None  Hair  Reviewed  Eyes  Reviewed  Mouth  Reviewed  Skin  Reviewed  Nails  Reviewed     Diet Order:   Diet Order           DIET SOFT Room service appropriate? Yes; Fluid consistency: Thin  Diet effective now         EDUCATION NEEDS:   Education needs have been addressed  Skin:  Skin Assessment: Reviewed RN Assessment  Last BM:  6/11- s/p fleets enema   Height:   Ht Readings from Last 1 Encounters:  07/06/17 5' 5" (1.651 m)    Weight:   Wt Readings from Last 1 Encounters:  07/06/17 181 lb 11.2 oz (82.4 kg)    Ideal Body Weight:  56.8 kg  BMI:  Body mass index is 30.24 kg/m.  Estimated Nutritional Needs:   Kcal:  1700-1900kcal/day   Protein:  82-99g/day   Fluid:  >1.7L/day   Casey Campbell MS, RD, LDN   Pager #- [phone] Office#- [phone] After Hours Pager: [phone]  

## 2017-07-08 ENCOUNTER — Encounter: Payer: Self-pay | Admitting: Gastroenterology

## 2017-07-08 LAB — PARATHYROID HORMONE, INTACT (NO CA): PTH: 56 pg/mL (ref 15–65)

## 2017-07-08 LAB — PHOSPHORUS: PHOSPHORUS: 2.2 mg/dL — AB (ref 2.5–4.6)

## 2017-07-08 LAB — CBC WITH DIFFERENTIAL/PLATELET
BASOS ABS: 0.1 10*3/uL (ref 0–0.1)
Basophils Relative: 1 %
EOS ABS: 0 10*3/uL (ref 0–0.7)
Eosinophils Relative: 0 %
HCT: 26.2 % — ABNORMAL LOW (ref 35.0–47.0)
Hemoglobin: 8.5 g/dL — ABNORMAL LOW (ref 12.0–16.0)
LYMPHS PCT: 23 %
Lymphs Abs: 2.5 10*3/uL (ref 1.0–3.6)
MCH: 27.5 pg (ref 26.0–34.0)
MCHC: 32.3 g/dL (ref 32.0–36.0)
MCV: 85 fL (ref 80.0–100.0)
MONO ABS: 0.4 10*3/uL (ref 0.2–0.9)
Monocytes Relative: 3 %
Neutro Abs: 8 10*3/uL — ABNORMAL HIGH (ref 1.4–6.5)
Neutrophils Relative %: 73 %
Platelets: 151 10*3/uL (ref 150–440)
RBC: 3.08 MIL/uL — AB (ref 3.80–5.20)
RDW: 18.1 % — AB (ref 11.5–14.5)
WBC: 10.9 10*3/uL (ref 3.6–11.0)

## 2017-07-08 LAB — BASIC METABOLIC PANEL
Anion gap: 8 (ref 5–15)
BUN: <5 mg/dL — ABNORMAL LOW (ref 6–20)
CALCIUM: 7.8 mg/dL — AB (ref 8.9–10.3)
CO2: 23 mmol/L (ref 22–32)
CREATININE: 0.51 mg/dL (ref 0.44–1.00)
Chloride: 108 mmol/L (ref 101–111)
GFR calc Af Amer: >60 mL/min (ref >60)
Glucose, Bld: 74 mg/dL (ref 65–99)
Potassium: 4.5 mmol/L (ref 3.5–5.1)
SODIUM: 139 mmol/L (ref 135–145)

## 2017-07-08 LAB — CBC
HCT: 22.6 % — ABNORMAL LOW (ref 35.0–47.0)
Hemoglobin: 7.1 g/dL — ABNORMAL LOW (ref 12.0–16.0)
MCH: 26.5 pg (ref 26.0–34.0)
MCHC: 31.5 g/dL — ABNORMAL LOW (ref 32.0–36.0)
MCV: 84.2 fL (ref 80.0–100.0)
PLATELETS: 157 10*3/uL (ref 150–440)
RBC: 2.69 MIL/uL — ABNORMAL LOW (ref 3.80–5.20)
RDW: 18.3 % — ABNORMAL HIGH (ref 11.5–14.5)
WBC: 11 10*3/uL (ref 3.6–11.0)

## 2017-07-08 LAB — HEPATITIS PANEL, ACUTE
HCV Ab: <0.1 s/co ratio (ref 0.0–0.9)
Hep A IgM: NEGATIVE
Hep B C IgM: NEGATIVE
Hepatitis B Surface Ag: NEGATIVE

## 2017-07-08 LAB — PREPARE RBC (CROSSMATCH)

## 2017-07-08 LAB — MAGNESIUM: MAGNESIUM: 1.9 mg/dL (ref 1.7–2.4)

## 2017-07-08 LAB — AFP TUMOR MARKER: AFP, Serum, Tumor Marker: 4.7 ng/mL (ref 0.0–8.3)

## 2017-07-08 LAB — ANTI-SMOOTH MUSCLE ANTIBODY, IGG: F-ACTIN AB IGG: 10 U (ref 0–19)

## 2017-07-08 LAB — MITOCHONDRIAL ANTIBODIES

## 2017-07-08 LAB — CERULOPLASMIN: CERULOPLASMIN: 23.7 mg/dL (ref 19.0–39.0)

## 2017-07-08 LAB — ALPHA-1 ANTITRYPSIN PHENOTYPE: A1 ANTITRYPSIN SER: 171 mg/dL (ref 90–200)

## 2017-07-08 LAB — ANA W/REFLEX IF POSITIVE: Anti Nuclear Antibody(ANA): NEGATIVE

## 2017-07-08 MED ORDER — SODIUM CHLORIDE 0.9 % IV SOLN: Freq: Once | INTRAVENOUS | Status: DC

## 2017-07-08 MED ORDER — K PHOS MONO-SOD PHOS DI & MONO 155-852-130 MG PO TABS
500.0000 mg | ORAL_TABLET | ORAL | Status: DC
  Administered 2017-07-08: 500 mg via ORAL
  Filled 2017-07-08 (×4): qty 2

## 2017-07-08 MED ORDER — ONDANSETRON 4 MG PO TBDP: 4.0000 mg | ORAL_TABLET | Freq: Three times a day (TID) | ORAL | 0 refills | Status: DC | PRN

## 2017-07-08 MED ORDER — PANTOPRAZOLE SODIUM 40 MG PO TBEC: 40.0000 mg | DELAYED_RELEASE_TABLET | Freq: Two times a day (BID) | ORAL | 0 refills | Status: DC

## 2017-07-08 MED ORDER — FOLIC ACID 1 MG PO TABS: 1.0000 mg | ORAL_TABLET | Freq: Every day | ORAL | Status: DC

## 2017-07-08 NOTE — Care Management (Signed)
Faxed patient's Zofran and protonix scripts to [facility] and provided application.

## 2017-07-08 NOTE — Care Management (Signed)
Appointment made at [facility] for June 28.  Instructed patient on the necessity to confirm the appointment, take proof of income- (husband's pay stubs for one month) utility bill, picture ID.

## 2017-07-08 NOTE — Progress Notes (Signed)
Discharge instructions explained to pt and pts husband / verbalized an understanding/ iv and tele removed/ RX given to pt/ transported off unit via wheelchair.

## 2017-07-08 NOTE — Progress Notes (Signed)
Mackenzie Darby, MD [address]  [place], [place] [address]  Main: [phone]  Fax: [phone] Pager: [phone]   Subjective: Patient feels significantly better today after blood transfusion. She received 1 unit last night and 1 unit this morning. She had good breakfast. She denies any complaints otherwise.   Objective: Vital signs in last 24 hours: Vitals:   07/08/17 0326 07/08/17 0829 07/08/17 1003 07/08/17 1031  BP: 115/73 121/76 117/61 116/61  Pulse: 87 85 87 84  Resp: _0 Temp: 98.2 F (36.8 C) 98.3 F (36.8 C) 98.6 F (37 C) 98.6 F (37 C)  TempSrc: Oral Oral    SpO2: 98% 100% 100% 99%  Weight:      Height:       Weight change:   Intake/Output Summary (Last 24 hours) at 07/08/2017 1150 Last data filed at 07/08/2017 1016 Gross per 24 hour  Intake 800 ml  Output -  Net 800 ml     Exam: Heart:: Regular rate and rhythm or S1S2 present Lungs: clear to auscultation Abdomen: distended secondary to hepatomegaly, nontender, bowel sounds present   Lab Results: CBC Latest Ref Rng & Units 07/08/2017 07/07/2017 07/07/2017  WBC 3.6 - 11.0 K/uL 11.0 11.2(H) 10.8  Hemoglobin 12.0 - 16.0 g/dL 7.1(L) 7.2(L) 6.2(L)  Hematocrit 35.0 - 47.0 % 22.6(L) 22.8(L) 20.4(L)  Platelets 150 - 440 K/uL 157 141(L) 163   Hepatic Function Latest Ref Rng & Units 07/07/2017 07/06/2017 10/17/2015  Total Protein 6.5 - 8.1 g/dL 7.5 7.8 8.9(H)  Albumin 3.5 - 5.0 g/dL 2.1(L) 2.2(L) 4.1  AST 15 - 41 U/L 99(H) 120(H) 168(H)  ALT 14 - 54 U/L 18 20 61(H)  Alk Phosphatase 38 - 126 U/L 124 142(H) 116  Total Bilirubin 0.3 - 1.2 mg/dL 6.3(H) 6.2(H) 0.8  Bilirubin, Direct 0.1 - 0.5 mg/dL - 3.5(H) -   BMP Latest Ref Rng & Units 07/08/2017 07/07/2017 07/06/2017  Glucose 65 - 99 mg/dL 74 65 -  BUN 6 - 20 mg/dL <5(L) <5(L) -  Creatinine 0.44 - 1.00 mg/dL 0.51 0.60 -  Sodium 135 - 145 mmol/L 139 141 -  Potassium 3.5 - 5.1 mmol/L 4.5 4.3 4.0  Chloride 101 - 111 mmol/L 108  109 -  CO2 22 - 32 mmol/L 23 21(L) -  Calcium 8.9 - 10.3 mg/dL 7.8(L) 7.7(L) -   Micro Results: No results found for this or any previous visit (from the past 240 hour(s)). Studies/Results: Mr 3d Recon At Scanner  Result Date: 07/06/2017 CLINICAL DATA:  Abdominal pain.  Abnormal liver function test. EXAM: MRI ABDOMEN WITHOUT AND WITH CONTRAST (INCLUDING MRCP) TECHNIQUE: Multiplanar multisequence MR imaging of the abdomen was performed both before and after the administration of intravenous contrast. Heavily T2-weighted images of the biliary and pancreatic ducts were obtained, and three-dimensional MRCP images were rendered by post processing. CONTRAST:  52m MULTIHANCE GADOBENATE DIMEGLUMINE 529 MG/ML IV SOLN COMPARISON:  07/06/2017 FINDINGS: Lower chest: No acute findings. Hepatobiliary: There is marked diffuse hepatic steatosis. The liver appears enlarged measuring 28 cm in length. The contour of the liver is slightly irregular. Small arterial phase enhancing structure within segment 8 measures 8 mm, image 37/19. This is T2 hyperintense and exhibits early arterial phase enhancement that persists on the delayed images. No washout associated with this lesion. Multiple stones identified within the neck of gallbladder which measure up to 5 mm. No gallbladder wall thickening or pericholecystic fluid. No biliary dilatation identified. Pancreas: Unremarkable  appearance of the pancreas. No inflammation or mass. No main duct dilatation. Spleen: The spleen measures 12.1 by 11.8 by 5.6 cm (volume = 420 cm^3). Adrenals/Urinary Tract: Normal adrenal glands. Unremarkable appearance of the left kidney. Scarring and volume loss from the right kidney noted. No mass or hydronephrosis identified. Stomach/Bowel: The stomach is normal. The visualized upper abdominal bowel loops are unremarkable. Vascular/Lymphatic: No pathologically enlarged lymph nodes identified. No enlarged lymph nodes identified within the upper abdomen.  Other:  No ascites. Musculoskeletal: No suspicious bone lesions identified. IMPRESSION: 1. Morphologic features of liver compatible with cirrhosis. Hepatomegaly and hepatic steatosis noted. 2. Arterial phase enhancing structure is identified within segment 8. In a patient that is at increased risk for hepatoma this is considered a LR-3: Intermediate probability for [facility]. Followup imaging in 6 months with repeat contrast enhanced MRI of the liver is recommended. 3. Borderline splenomegaly. 4. Gallstones. Electronically Signed   By: Kerby Moors M.D.   On: 07/06/2017 13:08   Mr Abdomen Mrcp Moise Boring Contast  Result Date: 07/06/2017 CLINICAL DATA:  Abdominal pain.  Abnormal liver function test. EXAM: MRI ABDOMEN WITHOUT AND WITH CONTRAST (INCLUDING MRCP) TECHNIQUE: Multiplanar multisequence MR imaging of the abdomen was performed both before and after the administration of intravenous contrast. Heavily T2-weighted images of the biliary and pancreatic ducts were obtained, and three-dimensional MRCP images were rendered by post processing. CONTRAST:  12m MULTIHANCE GADOBENATE DIMEGLUMINE 529 MG/ML IV SOLN COMPARISON:  07/06/2017 FINDINGS: Lower chest: No acute findings. Hepatobiliary: There is marked diffuse hepatic steatosis. The liver appears enlarged measuring 28 cm in length. The contour of the liver is slightly irregular. Small arterial phase enhancing structure within segment 8 measures 8 mm, image 37/19. This is T2 hyperintense and exhibits early arterial phase enhancement that persists on the delayed images. No washout associated with this lesion. Multiple stones identified within the neck of gallbladder which measure up to 5 mm. No gallbladder wall thickening or pericholecystic fluid. No biliary dilatation identified. Pancreas: Unremarkable appearance of the pancreas. No inflammation or mass. No main duct dilatation. Spleen: The spleen measures 12.1 by 11.8 by 5.6 cm (volume = 420 cm^3). Adrenals/Urinary  Tract: Normal adrenal glands. Unremarkable appearance of the left kidney. Scarring and volume loss from the right kidney noted. No mass or hydronephrosis identified. Stomach/Bowel: The stomach is normal. The visualized upper abdominal bowel loops are unremarkable. Vascular/Lymphatic: No pathologically enlarged lymph nodes identified. No enlarged lymph nodes identified within the upper abdomen. Other:  No ascites. Musculoskeletal: No suspicious bone lesions identified. IMPRESSION: 1. Morphologic features of liver compatible with cirrhosis. Hepatomegaly and hepatic steatosis noted. 2. Arterial phase enhancing structure is identified within segment 8. In a patient that is at increased risk for hepatoma this is considered a LR-3: Intermediate probability for [facility] Followup imaging in 6 months with repeat contrast enhanced MRI of the liver is recommended. 3. Borderline splenomegaly. 4. Gallstones. Electronically Signed   By: TKerby MoorsM.D.   On: 07/06/2017 13:08   Medications: I have reviewed the patient's current medications. Scheduled Meds: . aspirin EC  81 mg Oral Daily  . feeding supplement (ENSURE ENLIVE)  237 mL Oral TID BM  . folic acid  1 mg Oral Daily  . heparin  5,000 Units Subcutaneous Q8H  . LORazepam  0-4 mg Intravenous Q12H  . multivitamin with minerals  1 tablet Oral Daily  . pantoprazole  40 mg Oral BID  . phosphorus  500 mg Oral Q4H  . thiamine  100 mg  Oral Daily   Or  . thiamine  100 mg Intravenous Daily   Continuous Infusions: . sodium chloride    . sodium chloride    . lactated ringers with kcl 125 mL/hr at 07/07/17 0409   PRN Meds:.bisacodyl, diphenhydrAMINE, LORazepam **OR** LORazepam, morphine injection **OR** morphine injection, ondansetron (ZOFRAN) IV, senna-docusate, sodium chloride   Assessment: Active Problems:   Chest pain cirrhosis, hepatomegaly, liver lesion, folate deficiency Underwent EGD which revealed duodenal ulcer, clean based and erosive esophagitis -  Multiple non-bleeding duodenal ulcers with a clean ulcer base (Forrest Class III). - Normal second portion of the duodenum. - Non-bleeding erosive gastropathy. Biopsied. - Portal hypertensive gastropathy. Biopsied. - LA Grade A reflux esophagitis. - One 10 mm polyp in the proximal ascending colon, removed with a hot snare. Resected and retrieved. - Four 3 to 5 mm polyps in the sigmoid colon and in the descending colon, removed with a cold snare. Resected and retrieved. - Non-bleeding internal hemorrhoids.  Plan: Follow up on secondary liver disease workup as outpatient, so far acute hepatitis panel negative, serum plasmin normal. Ferritin is elevated in the setting of alcoholic liver disease and fatty liver Abstinence from alcohol and NSAID use Protonix 40 mg twice daily at least for 3 months Follow up on biopsies from recent EGD and colonoscopy Continue folic acid 1 mg daily Advised her about high-protein diet Follow up on the liver lesion with repeat MRI liver mass protocol in 6 months, AFP normal Follow up in GI clinic with me in 2-3 weeks after discharge Patient to be discharged home today   LOS: 2 days   Rohini Vanga 07/08/2017, 11:50 AM

## 2017-07-08 NOTE — Progress Notes (Signed)
Pharmacy Electrolyte Monitoring Consult:  Pharmacy consulted to assist in monitoring and replacing electrolytes in this 52 y.o. female admitted on 07/06/2017 with Chest Pain   Labs:  Sodium (mmol/L)  Date Value  07/08/2017 139   Potassium (mmol/L)  Date Value  07/08/2017 4.5   Magnesium (mg/dL)  Date Value  07/08/2017 1.9   Phosphorus (mg/dL)  Date Value  07/08/2017 2.2 (L)   Calcium (mg/dL)  Date Value  07/08/2017 7.8 (L)   Albumin (g/dL)  Date Value  07/07/2017 2.1 (L)    Assessment/Plan: K 4.5  Mag 1.9  Phos 2.2 Patient on Lactated Ringers with KCL 40 meq/L IVF @ 125 ml/hr. Will order NeutraPhos PO 2 tabs q4h x 4 doses. Will recheck all electrolytes tomorrow with AM labs.   Huie Ghuman A, Pharm.D., BCPS Clinical Pharmacist 07/08/2017 7:20 AM

## 2017-07-08 NOTE — Care Management (Signed)
CM consult for need of pcp.  Patient would like to get established at [facility] and if there is no availability, would like [facility].  Prefers afternoon appointment. Requested unit secretary to make appointment.  Husband is employed.

## 2017-07-09 LAB — TYPE AND SCREEN
ABO/RH(D): O POS
Antibody Screen: NEGATIVE
UNIT DIVISION: 0
Unit division: 0

## 2017-07-09 LAB — BPAM RBC
Blood Product Expiration Date: 201906302359
Blood Product Expiration Date: 201907042359
ISSUE DATE / TIME: 201906121011
ISSUE DATE / TIME: 201906111503
UNIT TYPE AND RH: 5100
Unit Type and Rh: 5100

## 2017-07-09 LAB — SURGICAL PATHOLOGY

## 2017-07-09 LAB — VITAMIN B1: VITAMIN B1 (THIAMINE): 33.9 nmol/L — AB (ref 66.5–200.0)

## 2017-07-10 ENCOUNTER — Other Ambulatory Visit: Payer: Self-pay

## 2017-07-10 DIAGNOSIS — A048 Other specified bacterial intestinal infections: Secondary | ICD-10-CM

## 2017-07-10 MED ORDER — OMEPRAZOLE 40 MG PO CPDR: 40.0000 mg | DELAYED_RELEASE_CAPSULE | Freq: Two times a day (BID) | ORAL | 0 refills | Status: DC

## 2017-07-10 MED ORDER — AMOXICILLIN 500 MG PO CAPS: 1000.0000 mg | ORAL_CAPSULE | Freq: Two times a day (BID) | ORAL | 0 refills | Status: AC

## 2017-07-10 MED ORDER — CLARITHROMYCIN 250 MG PO TABS: 250.0000 mg | ORAL_TABLET | Freq: Two times a day (BID) | ORAL | 0 refills | Status: AC

## 2017-07-14 LAB — HEMOCHROMATOSIS DNA-PCR(C282Y,H63D)

## 2017-07-16 NOTE — Anesthesia Postprocedure Evaluation (Signed)
Anesthesia Post Note  Patient: Mackenzie Little  Procedure(s) Performed: ESOPHAGOGASTRODUODENOSCOPY (EGD) (N/A ) COLONOSCOPY (N/A )  Patient location during evaluation: PACU Anesthesia Type: General Level of consciousness: awake and alert Pain management: pain level controlled Vital Signs Assessment: post-procedure vital signs reviewed and stable Respiratory status: spontaneous breathing, nonlabored ventilation, respiratory function stable and patient connected to nasal cannula oxygen Cardiovascular status: blood pressure returned to baseline and stable Postop Assessment: no apparent nausea or vomiting Anesthetic complications: no     Last Vitals:  Vitals:   07/08/17 1031 07/08/17 1239  BP: 116/61 113/71  Pulse: 84 81  Resp: 18 18  Temp: 37 C 36.7 C  SpO2: 99% 99%    Last Pain:  Vitals:   07/08/17 0829  TempSrc: Oral  PainSc:                  Molli Barrows

## 2017-07-22 ENCOUNTER — Encounter: Payer: Self-pay | Admitting: *Deleted

## 2017-07-22 ENCOUNTER — Ambulatory Visit: Payer: Self-pay | Admitting: Gastroenterology

## 2018-09-01 ENCOUNTER — Encounter: Payer: Self-pay | Admitting: Emergency Medicine

## 2018-09-01 ENCOUNTER — Inpatient Hospital Stay: Payer: Medicaid Other

## 2018-09-01 ENCOUNTER — Inpatient Hospital Stay
Admission: EM | Admit: 2018-09-01 | Discharge: 2018-09-28 | DRG: 870 | Disposition: E | Payer: Medicaid Other | Attending: Internal Medicine | Admitting: Internal Medicine

## 2018-09-01 ENCOUNTER — Other Ambulatory Visit: Payer: Self-pay

## 2018-09-01 DIAGNOSIS — K7041 Alcoholic hepatic failure with coma: Secondary | ICD-10-CM | POA: Diagnosis present

## 2018-09-01 DIAGNOSIS — Z87891 Personal history of nicotine dependence: Secondary | ICD-10-CM

## 2018-09-01 DIAGNOSIS — Z4659 Encounter for fitting and adjustment of other gastrointestinal appliance and device: Secondary | ICD-10-CM

## 2018-09-01 DIAGNOSIS — Z01818 Encounter for other preprocedural examination: Secondary | ICD-10-CM

## 2018-09-01 DIAGNOSIS — K922 Gastrointestinal hemorrhage, unspecified: Secondary | ICD-10-CM | POA: Diagnosis present

## 2018-09-01 DIAGNOSIS — D62 Acute posthemorrhagic anemia: Secondary | ICD-10-CM | POA: Diagnosis present

## 2018-09-01 DIAGNOSIS — Z20828 Contact with and (suspected) exposure to other viral communicable diseases: Secondary | ICD-10-CM | POA: Diagnosis present

## 2018-09-01 DIAGNOSIS — E872 Acidosis, unspecified: Secondary | ICD-10-CM

## 2018-09-01 DIAGNOSIS — K766 Portal hypertension: Secondary | ICD-10-CM | POA: Diagnosis present

## 2018-09-01 DIAGNOSIS — I10 Essential (primary) hypertension: Secondary | ICD-10-CM | POA: Diagnosis present

## 2018-09-01 DIAGNOSIS — J9602 Acute respiratory failure with hypercapnia: Secondary | ICD-10-CM | POA: Diagnosis present

## 2018-09-01 DIAGNOSIS — J9690 Respiratory failure, unspecified, unspecified whether with hypoxia or hypercapnia: Secondary | ICD-10-CM

## 2018-09-01 DIAGNOSIS — B3749 Other urogenital candidiasis: Secondary | ICD-10-CM | POA: Diagnosis not present

## 2018-09-01 DIAGNOSIS — D696 Thrombocytopenia, unspecified: Secondary | ICD-10-CM | POA: Diagnosis present

## 2018-09-01 DIAGNOSIS — N170 Acute kidney failure with tubular necrosis: Secondary | ICD-10-CM | POA: Diagnosis present

## 2018-09-01 DIAGNOSIS — F10231 Alcohol dependence with withdrawal delirium: Secondary | ICD-10-CM | POA: Diagnosis present

## 2018-09-01 DIAGNOSIS — K264 Chronic or unspecified duodenal ulcer with hemorrhage: Secondary | ICD-10-CM | POA: Diagnosis present

## 2018-09-01 DIAGNOSIS — D638 Anemia in other chronic diseases classified elsewhere: Secondary | ICD-10-CM | POA: Diagnosis present

## 2018-09-01 DIAGNOSIS — E876 Hypokalemia: Secondary | ICD-10-CM | POA: Diagnosis not present

## 2018-09-01 DIAGNOSIS — J9601 Acute respiratory failure with hypoxia: Secondary | ICD-10-CM | POA: Diagnosis present

## 2018-09-01 DIAGNOSIS — Z66 Do not resuscitate: Secondary | ICD-10-CM | POA: Diagnosis not present

## 2018-09-01 DIAGNOSIS — E870 Hyperosmolality and hypernatremia: Secondary | ICD-10-CM | POA: Diagnosis not present

## 2018-09-01 DIAGNOSIS — R6521 Severe sepsis with septic shock: Secondary | ICD-10-CM | POA: Diagnosis present

## 2018-09-01 DIAGNOSIS — Z515 Encounter for palliative care: Secondary | ICD-10-CM | POA: Diagnosis not present

## 2018-09-01 DIAGNOSIS — K7031 Alcoholic cirrhosis of liver with ascites: Secondary | ICD-10-CM | POA: Diagnosis present

## 2018-09-01 DIAGNOSIS — E878 Other disorders of electrolyte and fluid balance, not elsewhere classified: Secondary | ICD-10-CM | POA: Diagnosis not present

## 2018-09-01 DIAGNOSIS — R571 Hypovolemic shock: Secondary | ICD-10-CM | POA: Diagnosis present

## 2018-09-01 DIAGNOSIS — J9600 Acute respiratory failure, unspecified whether with hypoxia or hypercapnia: Secondary | ICD-10-CM

## 2018-09-01 DIAGNOSIS — K5900 Constipation, unspecified: Secondary | ICD-10-CM | POA: Diagnosis present

## 2018-09-01 DIAGNOSIS — F419 Anxiety disorder, unspecified: Secondary | ICD-10-CM | POA: Diagnosis present

## 2018-09-01 DIAGNOSIS — I248 Other forms of acute ischemic heart disease: Secondary | ICD-10-CM | POA: Diagnosis present

## 2018-09-01 DIAGNOSIS — J690 Pneumonitis due to inhalation of food and vomit: Secondary | ICD-10-CM | POA: Diagnosis not present

## 2018-09-01 DIAGNOSIS — K7011 Alcoholic hepatitis with ascites: Secondary | ICD-10-CM | POA: Diagnosis present

## 2018-09-01 DIAGNOSIS — A419 Sepsis, unspecified organism: Secondary | ICD-10-CM | POA: Diagnosis present

## 2018-09-01 DIAGNOSIS — E162 Hypoglycemia, unspecified: Secondary | ICD-10-CM | POA: Diagnosis present

## 2018-09-01 DIAGNOSIS — N939 Abnormal uterine and vaginal bleeding, unspecified: Secondary | ICD-10-CM

## 2018-09-01 DIAGNOSIS — K3189 Other diseases of stomach and duodenum: Secondary | ICD-10-CM | POA: Diagnosis present

## 2018-09-01 DIAGNOSIS — K7460 Unspecified cirrhosis of liver: Secondary | ICD-10-CM

## 2018-09-01 DIAGNOSIS — G9341 Metabolic encephalopathy: Secondary | ICD-10-CM | POA: Diagnosis present

## 2018-09-01 DIAGNOSIS — K7200 Acute and subacute hepatic failure without coma: Secondary | ICD-10-CM

## 2018-09-01 DIAGNOSIS — K7290 Hepatic failure, unspecified without coma: Secondary | ICD-10-CM

## 2018-09-01 DIAGNOSIS — Z7189 Other specified counseling: Secondary | ICD-10-CM

## 2018-09-01 LAB — TROPONIN I (HIGH SENSITIVITY)
Troponin I (High Sensitivity): 12 ng/L (ref <18)
Troponin I (High Sensitivity): 68 ng/L — ABNORMAL HIGH (ref <18)
Troponin I (High Sensitivity): 261 ng/L (ref <18)

## 2018-09-01 LAB — HEMOGLOBIN AND HEMATOCRIT, BLOOD
HCT: 20.7 % — ABNORMAL LOW (ref 36.0–46.0)
Hemoglobin: 6.5 g/dL — ABNORMAL LOW (ref 12.0–15.0)

## 2018-09-01 LAB — CBC WITH DIFFERENTIAL/PLATELET
Abs Immature Granulocytes: 0.07 10*3/uL (ref 0.00–0.07)
Basophils Absolute: 0 10*3/uL (ref 0.0–0.1)
Basophils Relative: 0 %
Eosinophils Absolute: 0 10*3/uL (ref 0.0–0.5)
Eosinophils Relative: 0 %
HCT: 20.9 % — ABNORMAL LOW (ref 36.0–46.0)
Hemoglobin: 6.5 g/dL — ABNORMAL LOW (ref 12.0–15.0)
Immature Granulocytes: 1 %
Lymphocytes Relative: 22 %
Lymphs Abs: 2.4 10*3/uL (ref 0.7–4.0)
MCH: 25.5 pg — ABNORMAL LOW (ref 26.0–34.0)
MCHC: 31.1 g/dL (ref 30.0–36.0)
MCV: 82 fL (ref 80.0–100.0)
Monocytes Absolute: 0.3 10*3/uL (ref 0.1–1.0)
Monocytes Relative: 3 %
Neutro Abs: 7.9 10*3/uL — ABNORMAL HIGH (ref 1.7–7.7)
Neutrophils Relative %: 74 %
Platelets: 160 10*3/uL (ref 150–400)
RBC: 2.55 MIL/uL — ABNORMAL LOW (ref 3.87–5.11)
RDW: 18.8 % — ABNORMAL HIGH (ref 11.5–15.5)
WBC: 10.6 10*3/uL — ABNORMAL HIGH (ref 4.0–10.5)
nRBC: 0 % (ref 0.0–0.2)

## 2018-09-01 LAB — PROCALCITONIN: Procalcitonin: 2.27 ng/mL

## 2018-09-01 LAB — HEPATIC FUNCTION PANEL
ALT: 28 U/L (ref 0–44)
AST: 92 U/L — ABNORMAL HIGH (ref 15–41)
Albumin: 2.4 g/dL — ABNORMAL LOW (ref 3.5–5.0)
Alkaline Phosphatase: 80 U/L (ref 38–126)
Bilirubin, Direct: 5.1 mg/dL — ABNORMAL HIGH (ref 0.0–0.2)
Indirect Bilirubin: 3.5 mg/dL — ABNORMAL HIGH (ref 0.3–0.9)
Total Bilirubin: 8.6 mg/dL — ABNORMAL HIGH (ref 0.3–1.2)
Total Protein: 7.3 g/dL (ref 6.5–8.1)

## 2018-09-01 LAB — PROTIME-INR
INR: 2.3 — ABNORMAL HIGH (ref 0.8–1.2)
Prothrombin Time: 24.9 seconds — ABNORMAL HIGH (ref 11.4–15.2)

## 2018-09-01 LAB — BASIC METABOLIC PANEL
Anion gap: 21 — ABNORMAL HIGH (ref 5–15)
BUN: 41 mg/dL — ABNORMAL HIGH (ref 6–20)
CO2: 19 mmol/L — ABNORMAL LOW (ref 22–32)
Calcium: 7.6 mg/dL — ABNORMAL LOW (ref 8.9–10.3)
Chloride: 93 mmol/L — ABNORMAL LOW (ref 98–111)
Creatinine, Ser: 2.47 mg/dL — ABNORMAL HIGH (ref 0.44–1.00)
GFR calc Af Amer: 25 mL/min — ABNORMAL LOW (ref >60)
GFR calc non Af Amer: 22 mL/min — ABNORMAL LOW (ref >60)
Glucose, Bld: 38 mg/dL — CL (ref 70–99)
Potassium: 3.9 mmol/L (ref 3.5–5.1)
Sodium: 133 mmol/L — ABNORMAL LOW (ref 135–145)

## 2018-09-01 LAB — LACTIC ACID, PLASMA
Lactic Acid, Venous: 7.9 mmol/L (ref 0.5–1.9)
Lactic Acid, Venous: 9.1 mmol/L (ref 0.5–1.9)
Lactic Acid, Venous: 8.5 mmol/L (ref 0.5–1.9)

## 2018-09-01 LAB — GLUCOSE, CAPILLARY
Glucose-Capillary: 25 mg/dL — CL (ref 70–99)
Glucose-Capillary: 90 mg/dL (ref 70–99)
Glucose-Capillary: 64 mg/dL — ABNORMAL LOW (ref 70–99)

## 2018-09-01 LAB — LIPASE, BLOOD: Lipase: 40 U/L (ref 11–51)

## 2018-09-01 LAB — MRSA PCR SCREENING: MRSA by PCR: NEGATIVE

## 2018-09-01 LAB — SARS CORONAVIRUS 2 BY RT PCR (HOSPITAL ORDER, PERFORMED IN ~~LOC~~ HOSPITAL LAB): SARS Coronavirus 2: NEGATIVE

## 2018-09-01 LAB — ETHANOL: Alcohol, Ethyl (B): <10 mg/dL (ref <10)

## 2018-09-01 LAB — PREPARE RBC (CROSSMATCH)

## 2018-09-01 MED ORDER — SODIUM CHLORIDE 0.9 % IV SOLN
2.0000 g | INTRAVENOUS | Status: DC
  Administered 2018-09-01 – 2018-09-02 (×2): 2 g via INTRAVENOUS
  Filled 2018-09-01 (×4): qty 2

## 2018-09-01 MED ORDER — ACETAMINOPHEN 325 MG PO TABS
650.0000 mg | ORAL_TABLET | Freq: Four times a day (QID) | ORAL | Status: DC | PRN
  Administered 2018-09-01: 650 mg via ORAL
  Filled 2018-09-01: qty 2

## 2018-09-01 MED ORDER — SODIUM CHLORIDE 0.9% FLUSH
10.0000 mL | INTRAVENOUS | Status: DC | PRN
  Administered 2018-09-04 – 2018-09-05 (×2): 40 mL
  Filled 2018-09-01 (×2): qty 40

## 2018-09-01 MED ORDER — THIAMINE HCL 100 MG/ML IJ SOLN
Freq: Once | INTRAVENOUS | Status: AC
  Administered 2018-09-01: via INTRAVENOUS
  Filled 2018-09-01: qty 1000

## 2018-09-01 MED ORDER — DEXTROSE 50 % IV SOLN
INTRAVENOUS | Status: AC
  Administered 2018-09-01: 15:00:00 50 mL via INTRAVENOUS
  Filled 2018-09-01: qty 50

## 2018-09-01 MED ORDER — TRAMADOL HCL 50 MG PO TABS
50.0000 mg | ORAL_TABLET | Freq: Two times a day (BID) | ORAL | Status: DC | PRN
  Administered 2018-09-01: 22:00:00 50 mg via ORAL
  Filled 2018-09-01: qty 1

## 2018-09-01 MED ORDER — PANTOPRAZOLE SODIUM 40 MG IV SOLR
40.0000 mg | Freq: Once | INTRAVENOUS | Status: DC
  Filled 2018-09-01: qty 40

## 2018-09-01 MED ORDER — ONDANSETRON HCL 4 MG PO TABS: 4.0000 mg | ORAL_TABLET | Freq: Four times a day (QID) | ORAL | Status: DC | PRN

## 2018-09-01 MED ORDER — VANCOMYCIN HCL 10 G IV SOLR
1750.0000 mg | Freq: Once | INTRAVENOUS | Status: AC
  Administered 2018-09-01: 1750 mg via INTRAVENOUS
  Filled 2018-09-01: qty 1750

## 2018-09-01 MED ORDER — SODIUM CHLORIDE 0.9% FLUSH
10.0000 mL | Freq: Two times a day (BID) | INTRAVENOUS | Status: DC
  Administered 2018-09-01: 10 mL
  Administered 2018-09-02: 40 mL
  Administered 2018-09-02: 30 mL
  Administered 2018-09-03 – 2018-09-05 (×5): 10 mL
  Administered 2018-09-05: 30 mL
  Administered 2018-09-06 – 2018-09-13 (×13): 10 mL
  Administered 2018-09-13: 40 mL
  Administered 2018-09-14 (×2): 10 mL
  Administered 2018-09-15: 30 mL
  Administered 2018-09-15 – 2018-09-18 (×6): 10 mL
  Administered 2018-09-18: 30 mL
  Administered 2018-09-19: 10 mL
  Administered 2018-09-19: 20 mL
  Administered 2018-09-20 (×2): 10 mL
  Administered 2018-09-21: 08:00:00 20 mL

## 2018-09-01 MED ORDER — SODIUM CHLORIDE 0.9 % IV SOLN
Freq: Once | INTRAVENOUS | Status: AC
  Administered 2018-09-01: 22:00:00 via INTRAVENOUS

## 2018-09-01 MED ORDER — THIAMINE HCL 100 MG/ML IJ SOLN
Freq: Once | INTRAVENOUS | Status: DC
  Filled 2018-09-01: qty 1000

## 2018-09-01 MED ORDER — NOREPINEPHRINE 4 MG/250ML-% IV SOLN
0.0000 ug/min | INTRAVENOUS | Status: DC
  Administered 2018-09-01: 18:00:00 2 ug/min via INTRAVENOUS
  Filled 2018-09-01: qty 250

## 2018-09-01 MED ORDER — CHLORHEXIDINE GLUCONATE 4 % EX LIQD: 6.0000 "application " | Freq: Every day | CUTANEOUS | Status: DC

## 2018-09-01 MED ORDER — SODIUM CHLORIDE 0.9 % IV SOLN
8.0000 mg/h | INTRAVENOUS | Status: AC
  Administered 2018-09-01 – 2018-09-04 (×7): 8 mg/h via INTRAVENOUS
  Filled 2018-09-01 (×7): qty 80

## 2018-09-01 MED ORDER — VITAMIN K1 10 MG/ML IJ SOLN
10.0000 mg | Freq: Once | INTRAMUSCULAR | Status: AC
  Administered 2018-09-01: 10 mg via SUBCUTANEOUS
  Filled 2018-09-01 (×2): qty 1

## 2018-09-01 MED ORDER — SODIUM CHLORIDE 0.9 % IV SOLN
Freq: Once | INTRAVENOUS | Status: AC
  Administered 2018-09-01: 17:00:00 via INTRAVENOUS

## 2018-09-01 MED ORDER — VANCOMYCIN VARIABLE DOSE PER UNSTABLE RENAL FUNCTION (PHARMACIST DOSING): Status: DC

## 2018-09-01 MED ORDER — LACTATED RINGERS IV BOLUS
1000.0000 mL | Freq: Once | INTRAVENOUS | Status: AC
  Administered 2018-09-01: 13:00:00 1000 mL via INTRAVENOUS

## 2018-09-01 MED ORDER — SODIUM CHLORIDE 0.9 % IV SOLN
INTRAVENOUS | Status: DC
  Administered 2018-09-01: 21:00:00 via INTRAVENOUS

## 2018-09-01 MED ORDER — PENTOXIFYLLINE ER 400 MG PO TBCR
400.0000 mg | EXTENDED_RELEASE_TABLET | Freq: Three times a day (TID) | ORAL | Status: DC
  Filled 2018-09-01 (×15): qty 1

## 2018-09-01 MED ORDER — DEXTROSE 50 % IV SOLN
50.0000 mL | Freq: Once | INTRAVENOUS | Status: AC
  Administered 2018-09-01: 15:00:00 50 mL via INTRAVENOUS

## 2018-09-01 MED ORDER — LORAZEPAM 2 MG/ML IJ SOLN
1.0000 mg | Freq: Once | INTRAMUSCULAR | Status: AC
  Administered 2018-09-01: 1 mg via INTRAVENOUS
  Filled 2018-09-01: qty 1

## 2018-09-01 MED ORDER — CHLORHEXIDINE GLUCONATE CLOTH 2 % EX PADS
6.0000 | MEDICATED_PAD | Freq: Every day | CUTANEOUS | Status: DC
  Administered 2018-09-01 – 2018-09-19 (×18): 6 via TOPICAL

## 2018-09-01 MED ORDER — SODIUM CHLORIDE 0.9 % IV SOLN
INTRAVENOUS | Status: DC | PRN
  Administered 2018-09-01 – 2018-09-04 (×2): 250 mL via INTRAVENOUS

## 2018-09-01 MED ORDER — LORAZEPAM 2 MG/ML IJ SOLN: 2.0000 mg | INTRAMUSCULAR | Status: DC | PRN

## 2018-09-01 MED ORDER — ACETAMINOPHEN 650 MG RE SUPP
650.0000 mg | Freq: Four times a day (QID) | RECTAL | Status: DC | PRN
  Administered 2018-09-05: 650 mg via RECTAL
  Filled 2018-09-01: qty 1

## 2018-09-01 MED ORDER — SODIUM CHLORIDE 0.9 % IV SOLN
1.0000 g | Freq: Once | INTRAVENOUS | Status: AC
  Administered 2018-09-01: 1 g via INTRAVENOUS
  Filled 2018-09-01: qty 10

## 2018-09-01 MED ORDER — SODIUM CHLORIDE 0.9 % IV SOLN
50.0000 ug/h | INTRAVENOUS | Status: DC
  Administered 2018-09-01 – 2018-09-08 (×17): 50 ug/h via INTRAVENOUS
  Filled 2018-09-01 (×35): qty 1

## 2018-09-01 MED ORDER — ONDANSETRON HCL 4 MG/2ML IJ SOLN
4.0000 mg | Freq: Four times a day (QID) | INTRAMUSCULAR | Status: DC | PRN
  Administered 2018-09-01: 17:00:00 4 mg via INTRAVENOUS
  Filled 2018-09-01: qty 2

## 2018-09-01 MED ORDER — SODIUM CHLORIDE 0.9 % IV SOLN: 8.0000 mg/h | INTRAVENOUS | Status: DC

## 2018-09-01 MED ORDER — SODIUM CHLORIDE 0.9 % IV SOLN
10.0000 mL/h | Freq: Once | INTRAVENOUS | Status: AC
  Administered 2018-09-01: 15:00:00 10 mL/h via INTRAVENOUS

## 2018-09-01 MED ORDER — DEXTROSE 10 % IV SOLN
INTRAVENOUS | Status: DC
  Administered 2018-09-01: 21:00:00 via INTRAVENOUS

## 2018-09-01 MED ORDER — PANTOPRAZOLE SODIUM 40 MG IV SOLR
40.0000 mg | Freq: Two times a day (BID) | INTRAVENOUS | Status: DC
  Administered 2018-09-05 – 2018-09-12 (×16): 40 mg via INTRAVENOUS
  Filled 2018-09-01 (×16): qty 40

## 2018-09-01 MED ORDER — DEXTROSE 50 % IV SOLN
INTRAVENOUS | Status: AC
  Filled 2018-09-01: qty 50

## 2018-09-01 MED ORDER — OCTREOTIDE LOAD VIA INFUSION
50.0000 ug | Freq: Once | INTRAVENOUS | Status: AC
  Administered 2018-09-01: 50 ug via INTRAVENOUS
  Filled 2018-09-01: qty 25

## 2018-09-01 MED ORDER — NOREPINEPHRINE 16 MG/250ML-% IV SOLN
0.0000 ug/min | INTRAVENOUS | Status: DC
  Administered 2018-09-01: 10 ug/min via INTRAVENOUS
  Administered 2018-09-02: 22 ug/min via INTRAVENOUS
  Administered 2018-09-03: 15 ug/min via INTRAVENOUS
  Administered 2018-09-04: 20 ug/min via INTRAVENOUS
  Administered 2018-09-04: 16 ug/min via INTRAVENOUS
  Administered 2018-09-05: 20 ug/min via INTRAVENOUS
  Administered 2018-09-05: 30 ug/min via INTRAVENOUS
  Administered 2018-09-06: 29 ug/min via INTRAVENOUS
  Administered 2018-09-06: 32 ug/min via INTRAVENOUS
  Administered 2018-09-07: 38 ug/min via INTRAVENOUS
  Administered 2018-09-07 (×2): 32 ug/min via INTRAVENOUS
  Administered 2018-09-08: 36 ug/min via INTRAVENOUS
  Administered 2018-09-08: 16 ug/min via INTRAVENOUS
  Administered 2018-09-08: 33 ug/min via INTRAVENOUS
  Administered 2018-09-10: 3 ug/min via INTRAVENOUS
  Filled 2018-09-01 (×16): qty 250

## 2018-09-01 NOTE — Consult Note (Signed)
Name: Mackenzie Little MRN: [id number] DOB: February 12, 1965    ADMISSION DATE:  09/08/2018 CONSULTATION DATE:  09/22/2018  REFERRING MD :  Dr. Estanislado Pandy  CHIEF COMPLAINT:  Chest pain, melena, generalized weakness  BRIEF PATIENT DESCRIPTION:  53 y.o. Female with PMH of ETOH abuse and HTN admitted 09/24/2018 with Hypovolemic shock in setting of Acute GI Bleed, presumed Alcoholic hepatitis, AKI, Lactic Acidosis, and elevated troponin. GI is following with tentative plan for EGD on 8/6.  SIGNIFICANT EVENTS  8/5>> Admission to ICU  STUDIES:  N/A  CULTURES: Blood x2 8/5>> SARS-CoV-2 PCR 8/5>> negative  ANTIBIOTICS: Rocephin x1 dose 8/5 Cefepime 8/5>> Vancomycin 8/5>>  HISTORY OF PRESENT ILLNESS:   Mackenzie Little is a 53 year old female with a past medical history notable for alcohol abuse and hypertension who presents to [facility] ED on 09/04/2018 with complaints of chest pain, melena, and generalized weakness.  She reports dark tarry stool stools over the last 2 weeks.  She reports nausea several days ago with one episode of nonbloody nonbilious emesis.  She denies hematochezia, fever, chills, shortness of breath, or sick contacts.  She admits to drinking approximately a bottle of rum about every other day.  Upon presentation to the ED she was noted to be hypotensive with systolic blood pressure in the 60s.  Initial work-up in the ED reveals hemoglobin 6.5, INR 2.3, lactic acid 7.9, anion gap 21, WBC 10.6, high-sensitivity troponin 12, creatinine 2.47, glucose 38.  She was given 1 L lactated Ringer's bolus, vitamin K,, along with Protonix and octreotide drips.  Plan is for 2 units of blood, 1 unit of FFP.  GI evaluated the patient in the ED, plan is for EGD on 8/6.  She is being admitted to ICU for further work-up and treatment of hypovolemic shock in the setting of acute GI bleed, presumed alcoholic hepatitis, AKI, lactic acidosis, and elevated troponin.  PCCM was consulted for further management.  Of note she  did have an EGD and colonoscopy performed in 06/2017 by Dr. Marius Ditch which revealed multiple nonbleeding duodenal ulcers with clean ulcer base, portal hypertensive gastropathy, and LA grade a reflux esophagitis.   PAST MEDICAL HISTORY :   has a past medical history of Hypertension.  has a past surgical history that includes Tooth extraction (May 2016); Esophagogastroduodenoscopy (N/A, 07/07/2017); and Colonoscopy (N/A, 07/07/2017). Prior to Admission medications   Medication Sig Start Date End Date Taking? Authorizing Provider  acetaminophen (TYLENOL) 325 MG tablet Take 650 mg by mouth every 6 (six) hours as needed.   Yes [provider]   No Known Allergies  FAMILY HISTORY:  family history is not on file. SOCIAL HISTORY:  reports that she has quit smoking. She has never used smokeless tobacco. She reports current alcohol use. She reports that she does not use drugs.   COVID-19 DISASTER DECLARATION:  FULL CONTACT PHYSICAL EXAMINATION WAS NOT POSSIBLE DUE TO TREATMENT OF COVID-19 AND  CONSERVATION OF PERSONAL PROTECTIVE EQUIPMENT, LIMITED EXAM FINDINGS INCLUDE-  Patient assessed or the symptoms described in the history of present illness.  In the context of the Global COVID-19 pandemic, which necessitated consideration that the patient might be at risk for infection with the SARS-CoV-2 virus that causes COVID-19, Institutional protocols and algorithms that pertain to the evaluation of patients at risk for COVID-19 are in a state of rapid change based on information released by regulatory bodies including the CDC and federal and state organizations. These policies and algorithms were followed during the patient's care while in  hospital.  REVIEW OF SYSTEMS:  Positives in BOLD Constitutional: Negative for fever, chills, weight loss,+generalized weakness, malaise/fatigue and diaphoresis.  HENT: Negative for hearing loss, ear pain, nosebleeds, congestion, sore throat, neck pain, tinnitus  and ear discharge.   Eyes: Negative for blurred vision, double vision, photophobia, pain, discharge and redness.  Respiratory: Negative for cough, hemoptysis, sputum production, shortness of breath, wheezing and stridor.   Cardiovascular: Negative for chest pain, palpitations, orthopnea, claudication, leg swelling and PND.  Gastrointestinal: Negative for heartburn, nausea, vomiting, abdominal pain, diarrhea, constipation, blood in stool and +melena.  Genitourinary: Negative for dysuria, urgency, frequency, hematuria and flank pain.  Musculoskeletal: Negative for myalgias, +back pain, joint pain and falls.  Skin: Negative for itching and rash.  Neurological: Negative for dizziness, tingling, tremors, sensory change, speech change, focal weakness, seizures, loss of consciousness, weakness and headaches.  Endo/Heme/Allergies: Negative for environmental allergies and polydipsia. Does not bruise/bleed easily.  SUBJECTIVE:  Reports dark black stools and mild back pain, asking for Tylenol Denies chest pain, shortness of breath, dizziness, palpitations, abdominal pain, fever/chills Wants something to drink  VITAL SIGNS: Temp:  [98 F (36.7 C)-98.7 F (37.1 C)] 98.5 F (36.9 C) (08/05 1555) Pulse Rate:  [60-96] 90 (08/05 1900) Resp:  [14-28] 16 (08/05 1900) BP: (63-109)/(31-61) 94/46 (08/05 1900) SpO2:  [93 %-100 %] 99 % (08/05 1900) Weight:  [77.1 kg] 77.1 kg (08/05 1833)  PHYSICAL EXAMINATION: General: Acutely ill-appearing female, sitting in bed, in no acute distress Neuro: Awake, alert and oriented x3, follows commands, no focal deficits, speech clear HEENT: Atraumatic, normocephalic, neck supple, no JVD, pupils PERRLA Cardiovascular: Regular rate and rhythm, S1-S2, no murmurs rubs or gallops, 2+ pulses Lungs: Clear to auscultation bilaterally, even, nonlabored, normal effort Abdomen: Soft, nontender, mildly distended, no guarding or rebound tenderness, bowel sounds positive x4  Musculoskeletal: Normal bulk and tone, 5/5 muscle strength in all extremities, no deformities, no edema Skin: Warm and dry, no obvious rashes, lesions, or ulcerations  Recent Labs  Lab 09/25/2018 1315  NA 133*  K 3.9  CL 93*  CO2 19*  BUN 41*  CREATININE 2.47*  GLUCOSE 38*   Recent Labs  Lab 09/25/2018 1315  HGB 6.5*  HCT 20.9*  WBC 10.6*  PLT 160   No results found.  ASSESSMENT / PLAN:  Acute GI Bleed Abdominal Ascites ? Alcoholic Hepatitis -IV Protonix & Octreotide drips -Follow H&H q6h -Transfuse pRBC's as indicated>> Receiving 2 units pRBC's and 1 FFP  -Received Vitamin K -NPO -GI consulted, appreciate input -Plan for EGD on 8/6 -Complete Abdominal US pending -GI recommends diagnostic Paracentesis  Hypovolemic shock in setting of GI bleed Elevated troponin, likely demand ischemia -Cardiac monitoring -Maintain MAP >65 -IV Fluids -Transfuse pRBC's as indicated -Levophed if needed to maintain MAP goal -Trend Troponin  Symptomatic anemia in setting of GI bleed -Monitor for S/Sx of bleeding -Trend CBC -SCD's for VTE Prophylaxis  -Transfuse for Hgb <8  Meets SIRS Criteria Leukocytosis -Monitor fever curve -Trend WBC's & Procalcitonin -Follow blood cultures -Diagnostic Paracentesis pending -Continue Cefepime & Vancomycin for now  AKI Anion gap metabolic acidosis in setting of Lactic acidosis -Monitor I&O's / urinary output -Follow BMP -Ensure adequate renal perfusion -Avoid nephrotoxic agents as able -Replace electrolytes as indicated -IV Fluids -Trend Lactic acid  Hypoglycemia -Received 1 amp D50 in ED -Place on D10 infusion for now -CBG's -Follow ICU Hypo/hyperglycemia protocol  Hx ETOH abuse -Monitor for s/sx of withdrawal  -CIWA protocol -Banana bag infusion>> Place on Thiamine, folic  acid, and MVI when able to take PO -Encourage ETOH cessation      DISPOSITION: ICU GOALS OF CARE: Full Code VTE PROPHYLAXIS: SCD'S (AVOID  ANTICOAGULANTS GIVEN GI BLEED) UPDATES: Updated pt at bedside 09/06/2018  Darel Hong, [facility] Pager: [phone] Cell: [phone]  09/07/2018, 7:20 PM

## 2018-09-01 NOTE — ED Notes (Signed)
Bed status back to ready to plan at this time. No room assigned to pt.

## 2018-09-01 NOTE — Consult Note (Addendum)
GI Inpatient Consult Note  Reason for Consult: Melena, Hyperbilirubemia   Attending Requesting Consult: Dr. Estanislado Pandy  History of Present Illness: Mackenzie Little is a 53 y.o. female seen for evaluation of hyperbilirubinemia, melena, alcohol abuse at the request of Dr. Estanislado Pandy. Pt presented to the ED today for complaints of generalized weakness. She reports over the past two weeks she has been noticing dark, tarry stools. She was nauseous several days ago and reports one episode of non-bloody and non-bilious emesis. She denies any fever, chills, chest pain, shortness of breath. No recent travel or sick contacts. She does report a history of alcohol abuse and reports she drinks a bottle of rum about every other day. No known prior history of chronic liver disease. She denies any mental status changes, pruritus, lower extremity edema, hematochezia. She denies any  She reports her abdomen has been more swollen recently. She has no prior diagnosis of cirrhosis. She had EGD and colonoscopy performed by Dr. Marius Ditch 06/2017 showing multiple non-bleeding duodenal ulcers with clean ulcer base, portal hypertensive gastropathy, and LA Grade A reflux esophagitis. Path showing H pylori gastritis and colon showed multiple tubular adenomas. Eradication was not confirmed. In the ED today, lab work up showed hemoglobin 6.5 and INR 2.3. She is receiving 2 units pRBCs and FFP is planned. She is hypotensive and receiving fluid bolus. Covid-19 test is pending. IV Octreotide has been started.    Last Colonoscopy: 06/2017  - One 10 mm polyp in the proximal ascending colon, removed with a hot snare. Resected and retrieved. - Four 3 to 5 mm polyps in the sigmoid colon and in the descending colon, removed with a cold snare. Resected and retrieved. - Non-bleeding internal hemorrhoids.  Last Endoscopy: 06/2017  - Multiple non-bleeding duodenal ulcers with a clean ulcer base (Forrest Class III). - Normal second portion of the  duodenum. - Non-bleeding erosive gastropathy. Biopsied. - Portal hypertensive gastropathy. Biopsied. - LA Grade A reflux esophagitis.   Past Medical History:  Past Medical History:  Diagnosis Date  . Hypertension     Problem List: Patient Active Problem List   Diagnosis Date Noted  . GI bleed 09/14/2018  . Chest pain 07/06/2017  . Well woman exam 10/17/2015  . Anemia 06/04/2015  . Generalized anxiety disorder 05/02/2015  . Elevated blood pressure (not hypertension) 08/21/2014  . Menopausal disorder 08/21/2014    Past Surgical History: Past Surgical History:  Procedure Laterality Date  . COLONOSCOPY N/A 07/07/2017   Procedure: COLONOSCOPY;  Surgeon: Lin Landsman, MD;  Location: [facility] ENDOSCOPY;  Service: Gastroenterology;  Laterality: N/A;  . ESOPHAGOGASTRODUODENOSCOPY N/A 07/07/2017   Procedure: ESOPHAGOGASTRODUODENOSCOPY (EGD);  Surgeon: Lin Landsman, MD;  Location: [facility] ENDOSCOPY;  Service: Gastroenterology;  Laterality: N/A;  . TOOTH EXTRACTION  May 2016    Allergies: No Known Allergies  Home Medications: (Not in a hospital admission)  Home medication reconciliation was completed with the patient.   Scheduled Inpatient Medications:   . pantoprazole (PROTONIX) IV  40 mg Intravenous Once  . [START ON 09/05/2018] pantoprazole  40 mg Intravenous Q12H  . phytonadione  10 mg Subcutaneous Once    Continuous Inpatient Infusions:   . sodium chloride    . norepinephrine (LEVOPHED) Adult infusion    . octreotide  (SANDOSTATIN)    IV infusion 50 mcg/hr (09/07/2018 1709)  . pantoprozole (PROTONIX) infusion 8 mg/hr (09/06/2018 1656)    PRN Inpatient Medications:  acetaminophen **OR** acetaminophen, ondansetron **OR** ondansetron (ZOFRAN) IV  Family History: family history is  not on file.  The patient's family history is negative for inflammatory bowel disorders, GI malignancy, or solid organ transplantation.  Social History:   reports that she has quit smoking.  She has never used smokeless tobacco. She reports current alcohol use. She reports that she does not use drugs. The patient denies ETOH, tobacco, or drug use.   Review of Systems: Constitutional: Weight is stable.  Eyes: No changes in vision. ENT: No oral lesions, sore throat.  GI: see HPI.  Heme/Lymph: No easy bruising.  CV: No chest pain.  GU: No hematuria.  Integumentary: No rashes.  Neuro: No headaches.  Psych: No depression/anxiety.  Endocrine: No heat/cold intolerance.  Allergic/Immunologic: No urticaria.  Resp: No cough, SOB.  Musculoskeletal: No joint swelling.    Physical Examination: BP (!) 89/52   Pulse 94   Temp 98.5 F (36.9 C) (Oral)   Resp 16   SpO2 99%  Gen: NAD, alert and oriented x 4 HEENT: PEERLA, EOMI, scleral icterus  Neck: supple, no JVD or thyromegaly Chest: CTA bilaterally, no wheezes, crackles, or other adventitious sounds CV: RRR, no m/g/c/r Abd: soft, mildly distended, hepatomegaly, +BS in all four quadrants;, guarding, ridigity, or rebound tenderness, ascites seen  Ext: no edema, well perfused with 2+ pulses, Skin: no rash or lesions noted Lymph: no LAD  Data: Lab Results  Component Value Date   WBC 10.6 (H) 09/05/2018   HGB 6.5 (L) 08/31/2018   HCT 20.9 (L) 08/28/2018   MCV 82.0 09/26/2018   PLT 160 09/15/2018   Recent Labs  Lab 09/06/2018 1315  HGB 6.5*   Lab Results  Component Value Date   NA 133 (L) 08/31/2018   K 3.9 08/30/2018   CL 93 (L) 09/23/2018   CO2 19 (L) 09/05/2018   BUN 41 (H) 09/09/2018   CREATININE 2.47 (H) 09/13/2018   Lab Results  Component Value Date   ALT 28 09/20/2018   AST 92 (H) 09/15/2018   ALKPHOS 80 09/22/2018   BILITOT 8.6 (H) 09/08/2018   Recent Labs  Lab 09/25/2018 1315  INR 2.3*   EGD/Colon Path 06/2017: DIAGNOSIS:  A. STOMACH; RANDOM COLD BIOPSY:  - HELICOBACTER PYLORI-ASSOCIATED CHRONIC GASTRITIS WITH MILD CHRONIC  ACTIVE INFLAMMATION.  - NEGATIVE FOR INTESTINAL METAPLASIA, DYSPLASIA,  AND MALIGNANCY.  - H. PYLORI BACTERIA IDENTIFIED IN ROUTINE SECTIONS.   B. COLON POLYP X 1, ASCENDING; HOT SNARE:  - TUBULAR ADENOMA, MULTIPLE FRAGMENTS.  - NEGATIVE FOR HIGH-GRADE DYSPLASIA AND MALIGNANCY.   C. COLON POLYP X 2, DESCENDING; COLD SNARE:  - TUBULAR ADENOMA, ONE FRAGMENT.  - NEGATIVE FOR HIGH-GRADE DYSPLASIA AND MALIGNANCY.  - ONE FRAGMENT OF UNREMARKABLE MUCOSA.   D. COLON POLYP X 1, SIGMOID; COLD SNARE:  - TUBULAR ADENOMA.  - NEGATIVE FOR HIGH-GRADE DYSPLASIA AND MALIGNANCY.   Assessment/Plan:  53 y/o AA female admitted for generalized weakness, melena x 2 weeks  1. 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 Acute GI bleeding - melena x 2 weeks 3. Alcoholic hepatitis - Maddrey score 63.3 (calculated with INR 2.3, Bilirubin 8.6) 4. Acute symptomatic anemia  5. Acute kidney injury 6. Elevated lactic acid - empiric antibiotics  - Concern for alcoholic hepatitis with patient having AST/ALT >2:1, INR 2.3, Bilirubin 8.6. Maddrey score is 63.3 with poor prognosis. She meets requirements for treatment. - Start Pentoxifylline 400 mg TID for a total of 4 weeks - Continue PPI - Continue Octreotide - Continue to monitor H&H. Transfuse for Hgb <7.0. - Complete Abd [place] -  Recommend diagnostic paracentesis with labs - Concern for upper GI bleeding given her melena for the past two weeks. DDx includes esophageal varices, Mallory-Weiss tear, esophagitis, gastritis, Dieulafoy's lesion, GAVE, AVMs, PUD - Advise EGD for luminal evaluation. I reviewed the risks (including bleeding, perforation, infection, anesthesia complications, cardiac/respiratory complications), benefits and alternatives of EGD. Patient consents to proceed.  - Plan for EGD tomorrow with Dr. Alice Reichert as long as INR <1.7. Pt is receiving FFP this afternoon. - Further recommendations after labs and procedure   Thank you for the consult. Please call with questions or  concerns.  Reeves Forth [facility] [phone] (Cell)

## 2018-09-01 NOTE — ED Triage Notes (Signed)
Pt here for chest pain and constipation.  Jaundiced.  Pale membranes.  Pressure in 60s. Pt is regular drinker. Denies medical hx.  Arrived EMS.

## 2018-09-01 NOTE — ED Notes (Signed)
This RN attempted to provide report to ICU 5 w/o sucess

## 2018-09-01 NOTE — Procedures (Signed)
Central Venous Catheter Insertion Procedure Note Mackenzie Little [id number] 03-Nov-1965  Procedure: Insertion of Central Venous Catheter Indications: Assessment of intravascular volume, Drug and/or fluid administration and Frequent blood sampling  Procedure Details Consent: Risks of procedure as well as the alternatives and risks of each were explained to the (patient/caregiver).  Consent for procedure obtained. Time Out: Verified patient identification, verified procedure, site/side was marked, verified correct patient position, special equipment/implants available, medications/allergies/relevent history reviewed, required imaging and test results available.  Performed  Maximum sterile technique was used including antiseptics, cap, gloves, gown, hand hygiene, mask and sheet. Skin prep: Chlorhexidine; local anesthetic administered A antimicrobial bonded/coated triple lumen catheter was placed in the right femoral vein due to emergent situation using the Seldinger technique.  Evaluation Blood flow good Complications: No apparent complications Patient did tolerate procedure well. Chest X-ray ordered to verify placement.  CXR: Not needed, placed in right femoral vein.   Procedure was performed using Ultrasound for direct visualization of cannulization of Right Femoral Vein.     Darel Hong, AGACNP-BC [facility] Pager: [phone] Cell: [place] 09/02/2018, 10:44 PM

## 2018-09-01 NOTE — H&P (View-Only) (Signed)
GI Inpatient Consult Note  Reason for Consult: Melena, Hyperbilirubemia   Attending Requesting Consult: Dr. Estanislado Pandy  History of Present Illness: Mackenzie Little is a 53 y.o. female seen for evaluation of hyperbilirubinemia, melena, alcohol abuse at the request of Dr. Estanislado Pandy. Pt presented to the ED today for complaints of generalized weakness. She reports over the past two weeks she has been noticing dark, tarry stools. She was nauseous several days ago and reports one episode of non-bloody and non-bilious emesis. She denies any fever, chills, chest pain, shortness of breath. No recent travel or sick contacts. She does report a history of alcohol abuse and reports she drinks a bottle of rum about every other day. No known prior history of chronic liver disease. She denies any mental status changes, pruritus, lower extremity edema, hematochezia. She denies any  She reports her abdomen has been more swollen recently. She has no prior diagnosis of cirrhosis. She had EGD and colonoscopy performed by Dr. Marius Ditch 06/2017 showing multiple non-bleeding duodenal ulcers with clean ulcer base, portal hypertensive gastropathy, and LA Grade A reflux esophagitis. Path showing H pylori gastritis and colon showed multiple tubular adenomas. Eradication was not confirmed. In the ED today, lab work up showed hemoglobin 6.5 and INR 2.3. She is receiving 2 units pRBCs and FFP is planned. She is hypotensive and receiving fluid bolus. Covid-19 test is pending. IV Octreotide has been started.    Last Colonoscopy: 06/2017  - One 10 mm polyp in the proximal ascending colon, removed with a hot snare. Resected and retrieved. - Four 3 to 5 mm polyps in the sigmoid colon and in the descending colon, removed with a cold snare. Resected and retrieved. - Non-bleeding internal hemorrhoids.  Last Endoscopy: 06/2017  - Multiple non-bleeding duodenal ulcers with a clean ulcer base (Forrest Class III). - Normal second portion of the  duodenum. - Non-bleeding erosive gastropathy. Biopsied. - Portal hypertensive gastropathy. Biopsied. - LA Grade A reflux esophagitis.   Past Medical History:  Past Medical History:  Diagnosis Date  . Hypertension     Problem List: Patient Active Problem List   Diagnosis Date Noted  . GI bleed 09/16/2018  . Chest pain 07/06/2017  . Well woman exam 10/17/2015  . Anemia 06/04/2015  . Generalized anxiety disorder 05/02/2015  . Elevated blood pressure (not hypertension) 08/21/2014  . Menopausal disorder 08/21/2014    Past Surgical History: Past Surgical History:  Procedure Laterality Date  . COLONOSCOPY N/A 07/07/2017   Procedure: COLONOSCOPY;  Surgeon: Lin Landsman, MD;  Location: [facility] ENDOSCOPY;  Service: Gastroenterology;  Laterality: N/A;  . ESOPHAGOGASTRODUODENOSCOPY N/A 07/07/2017   Procedure: ESOPHAGOGASTRODUODENOSCOPY (EGD);  Surgeon: Lin Landsman, MD;  Location: [facility] ENDOSCOPY;  Service: Gastroenterology;  Laterality: N/A;  . TOOTH EXTRACTION  May 2016    Allergies: No Known Allergies  Home Medications: (Not in a hospital admission)  Home medication reconciliation was completed with the patient.   Scheduled Inpatient Medications:   . pantoprazole (PROTONIX) IV  40 mg Intravenous Once  . [START ON 09/05/2018] pantoprazole  40 mg Intravenous Q12H  . phytonadione  10 mg Subcutaneous Once    Continuous Inpatient Infusions:   . sodium chloride    . norepinephrine (LEVOPHED) Adult infusion    . octreotide  (SANDOSTATIN)    IV infusion 50 mcg/hr (09/27/2018 1709)  . pantoprozole (PROTONIX) infusion 8 mg/hr (09/24/2018 1656)    PRN Inpatient Medications:  acetaminophen **OR** acetaminophen, ondansetron **OR** ondansetron (ZOFRAN) IV  Family History: family history is  not on file.  The patient's family history is negative for inflammatory bowel disorders, GI malignancy, or solid organ transplantation.  Social History:   reports that she has quit smoking.  She has never used smokeless tobacco. She reports current alcohol use. She reports that she does not use drugs. The patient denies ETOH, tobacco, or drug use.   Review of Systems: Constitutional: Weight is stable.  Eyes: No changes in vision. ENT: No oral lesions, sore throat.  GI: see HPI.  Heme/Lymph: No easy bruising.  CV: No chest pain.  GU: No hematuria.  Integumentary: No rashes.  Neuro: No headaches.  Psych: No depression/anxiety.  Endocrine: No heat/cold intolerance.  Allergic/Immunologic: No urticaria.  Resp: No cough, SOB.  Musculoskeletal: No joint swelling.    Physical Examination: BP (!) 89/52   Pulse 94   Temp 98.5 F (36.9 C) (Oral)   Resp 16   SpO2 99%  Gen: NAD, alert and oriented x 4 HEENT: PEERLA, EOMI, scleral icterus  Neck: supple, no JVD or thyromegaly Chest: CTA bilaterally, no wheezes, crackles, or other adventitious sounds CV: RRR, no m/g/c/r Abd: soft, mildly distended, hepatomegaly, +BS in all four quadrants;, guarding, ridigity, or rebound tenderness, ascites seen  Ext: no edema, well perfused with 2+ pulses, Skin: no rash or lesions noted Lymph: no LAD  Data: Lab Results  Component Value Date   WBC 10.6 (H) 09/20/2018   HGB 6.5 (L) 08/28/2018   HCT 20.9 (L) 09/02/2018   MCV 82.0 09/06/2018   PLT 160 09/07/2018   Recent Labs  Lab 08/30/2018 1315  HGB 6.5*   Lab Results  Component Value Date   NA 133 (L) 09/18/2018   K 3.9 09/27/2018   CL 93 (L) 09/12/2018   CO2 19 (L) 09/13/2018   BUN 41 (H) 09/04/2018   CREATININE 2.47 (H) 09/06/2018   Lab Results  Component Value Date   ALT 28 09/08/2018   AST 92 (H) 09/26/2018   ALKPHOS 80 09/09/2018   BILITOT 8.6 (H) 09/26/2018   Recent Labs  Lab 09/24/2018 1315  INR 2.3*   EGD/Colon Path 06/2017: DIAGNOSIS:  A. STOMACH; RANDOM COLD BIOPSY:  - HELICOBACTER PYLORI-ASSOCIATED CHRONIC GASTRITIS WITH MILD CHRONIC  ACTIVE INFLAMMATION.  - NEGATIVE FOR INTESTINAL METAPLASIA, DYSPLASIA,  AND MALIGNANCY.  - H. PYLORI BACTERIA IDENTIFIED IN ROUTINE SECTIONS.   B. COLON POLYP X 1, ASCENDING; HOT SNARE:  - TUBULAR ADENOMA, MULTIPLE FRAGMENTS.  - NEGATIVE FOR HIGH-GRADE DYSPLASIA AND MALIGNANCY.   C. COLON POLYP X 2, DESCENDING; COLD SNARE:  - TUBULAR ADENOMA, ONE FRAGMENT.  - NEGATIVE FOR HIGH-GRADE DYSPLASIA AND MALIGNANCY.  - ONE FRAGMENT OF UNREMARKABLE MUCOSA.   D. COLON POLYP X 1, SIGMOID; COLD SNARE:  - TUBULAR ADENOMA.  - NEGATIVE FOR HIGH-GRADE DYSPLASIA AND MALIGNANCY.   Assessment/Plan:  53 y/o AA female admitted for generalized weakness, melena x 2 weeks  1. 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 Acute GI bleeding - melena x 2 weeks 3. Alcoholic hepatitis - Maddrey score 63.3 (calculated with INR 2.3, Bilirubin 8.6) 4. Acute symptomatic anemia  5. Acute kidney injury 6. Elevated lactic acid - empiric antibiotics  - Concern for alcoholic hepatitis with patient having AST/ALT >2:1, INR 2.3, Bilirubin 8.6. Maddrey score is 63.3 with poor prognosis. She meets requirements for treatment. - Start Pentoxifylline 400 mg TID for a total of 4 weeks - Continue PPI - Continue Octreotide - Continue to monitor H&H. Transfuse for Hgb <7.0. - Complete Abd [place] -  Recommend diagnostic paracentesis with labs - Concern for upper GI bleeding given her melena for the past two weeks. DDx includes esophageal varices, Mallory-Weiss tear, esophagitis, gastritis, Dieulafoy's lesion, GAVE, AVMs, PUD - Advise EGD for luminal evaluation. I reviewed the risks (including bleeding, perforation, infection, anesthesia complications, cardiac/respiratory complications), benefits and alternatives of EGD. Patient consents to proceed.  - Plan for EGD tomorrow with Dr. Alice Reichert as long as INR <1.7. Pt is receiving FFP this afternoon. - Further recommendations after labs and procedure   Thank you for the consult. Please call with questions or  concerns.  Reeves Forth [facility] [phone] (Cell)

## 2018-09-01 NOTE — ED Notes (Addendum)
FIRST NURSE note: pt EMS per EMS , c/o constipation xfew days , VSS .

## 2018-09-01 NOTE — Consult Note (Signed)
Pharmacy Antibiotic Note  Mackenzie Little is a 53 y.o. female admitted on 09/27/2018 with sepsis.  Pharmacy has been consulted for cefepime and vancomycin dosing.  Plan: Cefepime 2 g q24H    Will give vancomycin 1750 mg x 1 dose and order a 24 hour level. Renal function is not stable, will dose by levels.   Temp (24hrs), Avg:98.4 F (36.9 C), Min:98 F (36.7 C), Max:98.7 F (37.1 C)  Recent Labs  Lab 09/25/2018 1315 09/19/2018 1725  WBC 10.6*  --   CREATININE 2.47*  --   LATICACIDVEN 7.9* 9.1*    CrCl cannot be calculated (Unknown ideal weight.).    No Known Allergies  Antimicrobials this admission: 8/5 cefepime >>  8/5 vancomycin >>   Dose adjustments this admission: None  Microbiology results: 8/5 BCx: pending  Thank you for allowing pharmacy to be a part of this patient's care.  Oswald Hillock, PharmD, BCPS 09/17/2018 6:30 PM

## 2018-09-01 NOTE — ED Notes (Signed)
Pt st daily drinking; last drink reported "2 days" pt denies CP, SHOB, ETOH withdrawal symptoms. Pt st "dark stools for 3 days".

## 2018-09-01 NOTE — H&P (Addendum)
Mackenzie Little NAME: Mackenzie Little    MR#:  [id number]  DATE OF BIRTH:  July 12, 1965  DATE OF ADMISSION:  09/24/2018  PRIMARY CARE PHYSICIAN: Ranae Plumber, PA   REQUESTING/REFERRING PHYSICIAN:   CHIEF COMPLAINT:   Chief Complaint  Patient presents with  . Constipation  . Chest Pain    HISTORY OF PRESENT ILLNESS: Mackenzie Little  is a 53 y.o. female with a known history of hypertension, alcohol abuse presented to the emergency room for generalized weakness.  Patient has been fatigued for the last 3 days and noticed dark tarry stool for the last couple of days.  She also had an episode of vomiting blood.  She was evaluated in the emergency room hemoglobin on 6.5 INR was 2.3.  2 units PRBC transfusion have been started in the emergency room and FFP transfusion is being planned.  Patient blood pressure was low she received IV fluid bolus.  Her lactic acid level is also elevated.  No fever.  No recent travel.  COVID-19 test is pending.  PAST MEDICAL HISTORY:   Past Medical History:  Diagnosis Date  . Hypertension     PAST SURGICAL HISTORY:  Past Surgical History:  Procedure Laterality Date  . COLONOSCOPY N/A 07/07/2017   Procedure: COLONOSCOPY;  Surgeon: Lin Landsman, MD;  Location: [facility] ENDOSCOPY;  Service: Gastroenterology;  Laterality: N/A;  . ESOPHAGOGASTRODUODENOSCOPY N/A 07/07/2017   Procedure: ESOPHAGOGASTRODUODENOSCOPY (EGD);  Surgeon: Lin Landsman, MD;  Location: [facility] ENDOSCOPY;  Service: Gastroenterology;  Laterality: N/A;  . TOOTH EXTRACTION  May 2016    SOCIAL HISTORY:  Social History   Tobacco Use  . Smoking status: Former Research scientist (life sciences)  . Smokeless tobacco: Never Used  Substance Use Topics  . Alcohol use: Yes    FAMILY HISTORY: No history of COPD diabetes cancer in the family  DRUG ALLERGIES: No Known Allergies  REVIEW OF SYSTEMS:   CONSTITUTIONAL: No fever, fatigue or weakness.  EYES: No  blurred or double vision.  EARS, NOSE, AND THROAT: No tinnitus or ear pain.  RESPIRATORY: No cough, shortness of breath, wheezing or hemoptysis.  CARDIOVASCULAR: No chest pain, orthopnea, edema.  GASTROINTESTINAL: Has nausea, vomiting,  No diarrhea or abdominal pain.  Has dark stools GENITOURINARY: No dysuria, hematuria.  ENDOCRINE: No polyuria, nocturia,  HEMATOLOGY: Has anemia,  No easy bruising or bleeding SKIN: No rash or lesion. MUSCULOSKELETAL: No joint pain or arthritis.   NEUROLOGIC: No tingling, numbness, weakness.  PSYCHIATRY: No anxiety or depression.   MEDICATIONS AT HOME:  Prior to Admission medications   Medication Sig Start Date End Date Taking? Authorizing Provider  acetaminophen (TYLENOL) 325 MG tablet Take 650 mg by mouth every 6 (six) hours as needed.   Yes [provider]      PHYSICAL EXAMINATION:   VITAL SIGNS: Blood pressure (!) 89/52, pulse 94, temperature 98.5 F (36.9 C), temperature source Oral, resp. rate 16, SpO2 99 %.  GENERAL:  53 y.o.-year-old patient lying in the bed with no acute distress.  EYES: Pupils equal, round, reactive to light and accommodation. No scleral icterus. Extraocular muscles intact.  HEENT: Head atraumatic, normocephalic. Oropharynx and nasopharynx clear.  NECK:  Supple, no jugular venous distention. No thyroid enlargement, no tenderness.  LUNGS: Normal breath sounds bilaterally, no wheezing, rales,rhonchi or crepitation. No use of accessory muscles of respiration.  CARDIOVASCULAR: S1, S2 normal. No murmurs, rubs, or gallops.  ABDOMEN: Soft, nontender, nondistended. Bowel sounds present. No organomegaly or mass.  EXTREMITIES: No pedal edema, cyanosis, or clubbing.  NEUROLOGIC: Cranial nerves II through XII are intact. Muscle strength 5/5 in all extremities. Sensation intact. Gait not checked.  PSYCHIATRIC: The patient is alert and oriented x 3.  SKIN: No obvious rash, lesion, or ulcer.   LABORATORY PANEL:    CBC Recent Labs  Lab 09/04/2018 1315  WBC 10.6*  HGB 6.5*  HCT 20.9*  PLT 160  MCV 82.0  MCH 25.5*  MCHC 31.1  RDW 18.8*  LYMPHSABS 2.4  MONOABS 0.3  EOSABS 0.0  BASOSABS 0.0   ------------------------------------------------------------------------------------------------------------------  Chemistries  Recent Labs  Lab 09/19/2018 1315  NA 133*  K 3.9  CL 93*  CO2 19*  GLUCOSE 38*  BUN 41*  CREATININE 2.47*  CALCIUM 7.6*  AST 92*  ALT 28  ALKPHOS 80  BILITOT 8.6*   ------------------------------------------------------------------------------------------------------------------ CrCl cannot be calculated (Unknown ideal weight.). ------------------------------------------------------------------------------------------------------------------ No results for input(s): TSH, T4TOTAL, T3FREE, THYROIDAB in the last 72 hours.  Invalid input(s): FREET3   Coagulation profile Recent Labs  Lab 09/20/2018 1315  INR 2.3*   ------------------------------------------------------------------------------------------------------------------- No results for input(s): DDIMER in the last 72 hours. -------------------------------------------------------------------------------------------------------------------  Cardiac Enzymes No results for input(s): CKMB, TROPONINI, MYOGLOBIN in the last 168 hours.  Invalid input(s): CK ------------------------------------------------------------------------------------------------------------------ Invalid input(s): POCBNP  ---------------------------------------------------------------------------------------------------------------  Urinalysis No results found for: COLORURINE, APPEARANCEUR, LABSPEC, PHURINE, GLUCOSEU, HGBUR, BILIRUBINUR, KETONESUR, PROTEINUR, UROBILINOGEN, NITRITE, LEUKOCYTESUR   RADIOLOGY: No results found.  EKG: Orders placed or performed during the hospital encounter of 09/26/2018  . EKG 12-Lead  . EKG 12-Lead     IMPRESSION AND PLAN: 53 year old female patient with a known history of hypertension, alcohol abuse presented to the emergency room for generalized weakness.  Patient has been fatigued for the last 3 days and noticed dark tarry stool for the last couple of days.   -Acute gastrointestinal bleeding Admit patient to ICU IV Protonix drip IV octreotide drip IV fluids PRBC transfusion FFP transfusion Gastroenterology consult Serial hemoglobin hematocrit monitoring  -Acute symptomatic anemia Transfuse 2 units PRBC IV  -Hypotension IV fluids and PRBC transfusion If blood pressure does not respond and start IV pressor medication  -Acute kidney injury Avoid nephrotoxic medications IV fluids and monitor renal function  -Elevated lactic acid Could be from dehydration Follow-up levels Empiric antibiotics Will start with broad spectrum  -Elevated troponins  Could be from demand ischemia  All the records are reviewed and case discussed with ED provider. Management plans discussed with the patient, family and they are in agreement.  CODE STATUS:Full code Code Status History    Date Active Date Inactive Code Status Order ID Comments User Context   07/06/2017 0709 07/08/2017 1928 Full Code [id number]  Arta Silence, MD ED   Advance Care Planning Activity       TOTAL CRITICAL CARE TIME TAKING CARE OF THIS PATIENT: 53 minutes.    Saundra Shelling M.D on 09/18/2018 at 5:31 PM  Between 7am to 6pm - Pager - [phone]  After 6pm go to [email or web address] - password [email or web address] [facility]  [facility]  Office  [phone]  CC: Primary care physician; Ranae Plumber, [place]

## 2018-09-01 NOTE — ED Provider Notes (Signed)
[facility] Emergency Department Provider Note   ____________________________________________   First MD Initiated Contact with Patient 08/29/2018 1304     (approximate)  I have reviewed the triage vital signs and the nursing notes.   HISTORY  Chief Complaint Constipation and Chest Pain    HPI Mackenzie Little is a 53 y.o. female with past medical history of hypertension and alcohol abuse who presents to the ED complaining of generalized weakness.  Patient reports that she has been feeling malaised and generally weak over the past 3 days, has noticed dark tarry stools frequently over that time.  She states she has felt nauseous and vomited once a couple of days ago, but her vomit did not have any blood or coffee-ground appearance.  She states she has had some sharp chest pain over the past couple of days as well, but denies any fevers, chills, cough, or shortness of breath.  She denies any sick contacts.  She does admit to regular heavy drinking, states she will buy a large bottle of liquor on the weekends and smaller one during the week, usually takes her 2 days to finish the bottle.  She denies any history of liver disease, but states her abdomen has appeared more swollen recently.  She denies any drug use.        Past Medical History:  Diagnosis Date  . Hypertension     Patient Active Problem List   Diagnosis Date Noted  . Chest pain 07/06/2017  . Well woman exam 10/17/2015  . Anemia 06/04/2015  . Generalized anxiety disorder 05/02/2015  . Elevated blood pressure (not hypertension) 08/21/2014  . Menopausal disorder 08/21/2014    Past Surgical History:  Procedure Laterality Date  . COLONOSCOPY N/A 07/07/2017   Procedure: COLONOSCOPY;  Surgeon: Lin Landsman, MD;  Location: [facility] ENDOSCOPY;  Service: Gastroenterology;  Laterality: N/A;  . ESOPHAGOGASTRODUODENOSCOPY N/A 07/07/2017   Procedure: ESOPHAGOGASTRODUODENOSCOPY (EGD);  Surgeon: Lin Landsman, MD;  Location: [facility] ENDOSCOPY;  Service: Gastroenterology;  Laterality: N/A;  . TOOTH EXTRACTION  May 2016    Prior to Admission medications   Medication Sig Start Date End Date Taking? Authorizing Provider  acetaminophen (TYLENOL) 325 MG tablet Take 650 mg by mouth every 6 (six) hours as needed.   Yes [provider]    Allergies Patient has no known allergies.  History reviewed. No pertinent family history.  Social History Social History   Tobacco Use  . Smoking status: Former Research scientist (life sciences)  . Smokeless tobacco: Never Used  Substance Use Topics  . Alcohol use: Yes  . Drug use: No    Review of Systems  Constitutional: No fever/chills.  Positive for generalized weakness. Eyes: No visual changes. ENT: No sore throat. Cardiovascular: Positive for chest pain Respiratory: Denies shortness of breath. Gastrointestinal: No abdominal pain.  Positive for nausea and vomiting.  Positive for dark tarry stool.. Genitourinary: Negative for dysuria. Musculoskeletal: Negative for back pain. Skin: Negative for rash. Neurological: Negative for headaches, focal weakness or numbness.  ____________________________________________   PHYSICAL EXAM:  VITAL SIGNS: ED Triage Vitals  Enc Vitals Group     BP 09/20/2018 1252 (!) 63/38     Pulse Rate 09/24/2018 1252 60     Resp 09/03/2018 1252 (!) 24     Temp 09/05/2018 1252 98.7 F (37.1 C)     Temp Source 09/25/2018 1252 Oral     SpO2 09/03/2018 1252 93 %     Weight --  Height --      Head Circumference --      Peak Flow --      Pain Score 09/17/2018 1247 0     Pain Loc --      Pain Edu? --      Excl. in [place]? --     Constitutional: Alert and oriented.  Ill and pale appearing. Eyes: Scleral icterus bilaterally. Head: Atraumatic. Nose: No congestion/rhinnorhea. Mouth/Throat: Mucous membranes are moist. Neck: Normal ROM Cardiovascular: Normal rate, regular rhythm.  Systolic murmur noted. Respiratory: Normal respiratory  effort.  No retractions. Lungs CTAB. Gastrointestinal: Soft and distended abdomen with palpable firm large liver.  Ascites noted. Genitourinary: deferred Musculoskeletal: No lower extremity tenderness nor edema. Neurologic:  Normal speech and language. No gross focal neurologic deficits are appreciated. Skin: Pale and cool skin. Psychiatric: Mood and affect are normal. Speech and behavior are normal.  ____________________________________________   LABS (all labs ordered are listed, but only abnormal results are displayed)  Labs Reviewed  CBC WITH DIFFERENTIAL/PLATELET - Abnormal; Notable for the following components:      Result Value   WBC 10.6 (*)    RBC 2.55 (*)    Hemoglobin 6.5 (*)    HCT 20.9 (*)    MCH 25.5 (*)    RDW 18.8 (*)    Neutro Abs 7.9 (*)    All other components within normal limits  BASIC METABOLIC PANEL - Abnormal; Notable for the following components:   Sodium 133 (*)    Chloride 93 (*)    CO2 19 (*)    Glucose, Bld 38 (*)    BUN 41 (*)    Creatinine, Ser 2.47 (*)    Calcium 7.6 (*)    GFR calc non Af Amer 22 (*)    GFR calc Af Amer 25 (*)    Anion gap 21 (*)    All other components within normal limits  HEPATIC FUNCTION PANEL - Abnormal; Notable for the following components:   Albumin 2.4 (*)    AST 92 (*)    Total Bilirubin 8.6 (*)    Bilirubin, Direct 5.1 (*)    Indirect Bilirubin 3.5 (*)    All other components within normal limits  LACTIC ACID, PLASMA - Abnormal; Notable for the following components:   Lactic Acid, Venous 7.9 (*)    All other components within normal limits  PROTIME-INR - Abnormal; Notable for the following components:   Prothrombin Time 24.9 (*)    INR 2.3 (*)    All other components within normal limits  GLUCOSE, CAPILLARY - Abnormal; Notable for the following components:   Glucose-Capillary 25 (*)    All other components within normal limits  CULTURE, BLOOD (ROUTINE X 2)  CULTURE, BLOOD (ROUTINE X 2)  SARS CORONAVIRUS  2  ETHANOL  LIPASE, BLOOD  GLUCOSE, CAPILLARY  LACTIC ACID, PLASMA  POC OCCULT BLOOD, ED  TYPE AND SCREEN  PREPARE RBC (CROSSMATCH)  PREPARE FRESH FROZEN PLASMA  TROPONIN I (HIGH SENSITIVITY)  TROPONIN I (HIGH SENSITIVITY)   ____________________________________________  EKG  ED ECG REPORT I, Blake Divine, the attending physician, personally viewed and interpreted this ECG.   Date: 09/12/2018  EKG Time: 12:45 PM  Rate: 99 bpm  Rhythm: normal sinus rhythm, prolonged QT interval, left axis deviation  Axis: Left axis deviation  Intervals:none  ST&T Change: None  ____________________________________________  RADIOLOGY  None   ____________________________________________   PROCEDURES  Procedure(s) performed (including Critical Care):  .Critical Care Performed by: Blake Divine,  MD Authorized by: Blake Divine, MD   Critical care provider statement:    Critical care time (minutes):  45   Critical care time was exclusive of:  Separately billable procedures and treating other patients and teaching time   Critical care was necessary to treat or prevent imminent or life-threatening deterioration of the following conditions:  Shock, circulatory failure and hepatic failure   Critical care was time spent personally by me on the following activities:  Discussions with consultants, evaluation of patient's response to treatment, examination of patient, ordering and performing treatments and interventions, ordering and review of laboratory studies, ordering and review of radiographic studies, pulse oximetry, re-evaluation of patient's condition, obtaining history from patient or surrogate and review of old charts   I assumed direction of critical care for this patient from another provider in my specialty: no       ____________________________________________   INITIAL IMPRESSION / ASSESSMENT AND PLAN / ED COURSE       53 year old female with history of alcohol abuse  presenting to the ED with generalized weakness and dark tarry stools for the past 3 to 4 days.  Differential includes variceal bleed, bleeding ulcer, other upper GI bleed, lower GI bleed, sepsis, electrolyte abnormality, coagulopathy, liver failure.  Patient was ill-appearing, noted to have systolic blood pressure in the 60s in triage.  Appears consistent with lower GI bleed given melanotic guaiac positive stool.  Potential for variceal bleed given apparent liver disease, however EGD 1 year ago did not show any significant varices.  Will transfuse 1 unit PRBCs, blood pressure improving following fluid bolus, patient crossmatched for additional unit PRBCs.  Case discussed with GI and will start on Protonix drip, octreotide drip, given dose of Rocephin, and will also give FFP and vitamin K to correct coagulopathy.  Patient noted to be hypoglycemic despite normal mental status, will maintain patient's n.p.o. status and give amp of D50.  Glucose levels improved on recheck.  Case discussed with hospitalist, who accepts patient for admission.      ____________________________________________   FINAL CLINICAL IMPRESSION(S) / ED DIAGNOSES  Final diagnoses:  Upper GI bleed  Liver failure without hepatic coma, unspecified chronicity (HCC)  Hypoglycemia  Lactic acidosis     ED Discharge Orders    None       Note:  This document was prepared using Dragon voice recognition software and may include unintentional dictation errors.   Blake Divine, MD 09/25/2018 [phone]

## 2018-09-02 ENCOUNTER — Inpatient Hospital Stay: Payer: Medicaid Other

## 2018-09-02 ENCOUNTER — Encounter: Admission: EM | Disposition: E | Payer: Self-pay | Source: Home / Self Care | Attending: Internal Medicine

## 2018-09-02 DIAGNOSIS — K7290 Hepatic failure, unspecified without coma: Secondary | ICD-10-CM

## 2018-09-02 DIAGNOSIS — K7460 Unspecified cirrhosis of liver: Secondary | ICD-10-CM

## 2018-09-02 DIAGNOSIS — E872 Acidosis, unspecified: Secondary | ICD-10-CM

## 2018-09-02 DIAGNOSIS — K921 Melena: Secondary | ICD-10-CM

## 2018-09-02 DIAGNOSIS — K7031 Alcoholic cirrhosis of liver with ascites: Secondary | ICD-10-CM

## 2018-09-02 LAB — CBC
HCT: 18.6 % — ABNORMAL LOW (ref 36.0–46.0)
Hemoglobin: 6.2 g/dL — ABNORMAL LOW (ref 12.0–15.0)
MCH: 27.7 pg (ref 26.0–34.0)
MCHC: 33.3 g/dL (ref 30.0–36.0)
MCV: 83 fL (ref 80.0–100.0)
Platelets: 144 10*3/uL — ABNORMAL LOW (ref 150–400)
RBC: 2.24 MIL/uL — ABNORMAL LOW (ref 3.87–5.11)
RDW: 17.6 % — ABNORMAL HIGH (ref 11.5–15.5)
WBC: 20.1 10*3/uL — ABNORMAL HIGH (ref 4.0–10.5)
nRBC: 0.5 % — ABNORMAL HIGH (ref 0.0–0.2)

## 2018-09-02 LAB — LACTIC ACID, PLASMA
Lactic Acid, Venous: 8.2 mmol/L (ref 0.5–1.9)
Lactic Acid, Venous: 6.6 mmol/L (ref 0.5–1.9)
Lactic Acid, Venous: 3.7 mmol/L (ref 0.5–1.9)
Lactic Acid, Venous: 2 mmol/L (ref 0.5–1.9)

## 2018-09-02 LAB — GLUCOSE, CAPILLARY
Glucose-Capillary: 105 mg/dL — ABNORMAL HIGH (ref 70–99)
Glucose-Capillary: 125 mg/dL — ABNORMAL HIGH (ref 70–99)
Glucose-Capillary: 128 mg/dL — ABNORMAL HIGH (ref 70–99)
Glucose-Capillary: 131 mg/dL — ABNORMAL HIGH (ref 70–99)
Glucose-Capillary: 137 mg/dL — ABNORMAL HIGH (ref 70–99)
Glucose-Capillary: 141 mg/dL — ABNORMAL HIGH (ref 70–99)
Glucose-Capillary: 81 mg/dL (ref 70–99)
Glucose-Capillary: 87 mg/dL (ref 70–99)
Glucose-Capillary: 98 mg/dL (ref 70–99)
Glucose-Capillary: 98 mg/dL (ref 70–99)
Glucose-Capillary: 99 mg/dL (ref 70–99)

## 2018-09-02 LAB — HEMOGLOBIN AND HEMATOCRIT, BLOOD
HCT: 18.9 % — ABNORMAL LOW (ref 36.0–46.0)
HCT: 22 % — ABNORMAL LOW (ref 36.0–46.0)
HCT: 21 % — ABNORMAL LOW (ref 36.0–46.0)
Hemoglobin: 6.4 g/dL — ABNORMAL LOW (ref 12.0–15.0)
Hemoglobin: 7.4 g/dL — ABNORMAL LOW (ref 12.0–15.0)
Hemoglobin: 7 g/dL — ABNORMAL LOW (ref 12.0–15.0)

## 2018-09-02 LAB — TROPONIN I (HIGH SENSITIVITY)
Troponin I (High Sensitivity): 512 ng/L (ref <18)
Troponin I (High Sensitivity): 608 ng/L (ref <18)
Troponin I (High Sensitivity): 597 ng/L (ref <18)

## 2018-09-02 LAB — PROCALCITONIN: Procalcitonin: 2.71 ng/mL

## 2018-09-02 LAB — BASIC METABOLIC PANEL
Anion gap: 14 (ref 5–15)
BUN: 62 mg/dL — ABNORMAL HIGH (ref 6–20)
CO2: 18 mmol/L — ABNORMAL LOW (ref 22–32)
Calcium: 6.6 mg/dL — ABNORMAL LOW (ref 8.9–10.3)
Chloride: 102 mmol/L (ref 98–111)
Creatinine, Ser: 3 mg/dL — ABNORMAL HIGH (ref 0.44–1.00)
GFR calc Af Amer: 20 mL/min — ABNORMAL LOW (ref >60)
GFR calc non Af Amer: 17 mL/min — ABNORMAL LOW (ref >60)
Glucose, Bld: 127 mg/dL — ABNORMAL HIGH (ref 70–99)
Potassium: 3.9 mmol/L (ref 3.5–5.1)
Sodium: 134 mmol/L — ABNORMAL LOW (ref 135–145)

## 2018-09-02 LAB — PROTIME-INR
INR: 1.8 — ABNORMAL HIGH (ref 0.8–1.2)
Prothrombin Time: 20.2 seconds — ABNORMAL HIGH (ref 11.4–15.2)

## 2018-09-02 LAB — PREPARE RBC (CROSSMATCH)

## 2018-09-02 SURGERY — EGD (ESOPHAGOGASTRODUODENOSCOPY)
Anesthesia: General

## 2018-09-02 MED ORDER — SODIUM CHLORIDE 0.9 % IV SOLN
Freq: Once | INTRAVENOUS | Status: AC
  Administered 2018-09-02: 01:00:00 via INTRAVENOUS

## 2018-09-02 MED ORDER — METRONIDAZOLE IN NACL 5-0.79 MG/ML-% IV SOLN
500.0000 mg | Freq: Three times a day (TID) | INTRAVENOUS | Status: DC
  Administered 2018-09-02 – 2018-09-07 (×16): 500 mg via INTRAVENOUS
  Filled 2018-09-02 (×17): qty 100

## 2018-09-02 MED ORDER — SODIUM CHLORIDE 0.9 % IV BOLUS
1000.0000 mL | Freq: Once | INTRAVENOUS | Status: AC
  Administered 2018-09-02: 1000 mL via INTRAVENOUS

## 2018-09-02 MED ORDER — LORAZEPAM 2 MG/ML IJ SOLN
1.0000 mg | INTRAMUSCULAR | Status: DC | PRN
  Administered 2018-09-02 – 2018-09-04 (×7): 2 mg via INTRAVENOUS
  Filled 2018-09-02 (×8): qty 1

## 2018-09-02 MED ORDER — DEXMEDETOMIDINE HCL IN NACL 400 MCG/100ML IV SOLN
0.4000 ug/kg/h | INTRAVENOUS | Status: DC
  Administered 2018-09-02: 1 ug/kg/h via INTRAVENOUS
  Administered 2018-09-02: 0.4 ug/kg/h via INTRAVENOUS
  Administered 2018-09-02 (×2): 1.2 ug/kg/h via INTRAVENOUS
  Administered 2018-09-02: 0.7 ug/kg/h via INTRAVENOUS
  Administered 2018-09-03: 0.6 ug/kg/h via INTRAVENOUS
  Administered 2018-09-03: 1.2 ug/kg/h via INTRAVENOUS
  Administered 2018-09-03: 1 ug/kg/h via INTRAVENOUS
  Administered 2018-09-03 (×2): 0.9 ug/kg/h via INTRAVENOUS
  Administered 2018-09-04: 1 ug/kg/h via INTRAVENOUS
  Administered 2018-09-04: 0.5 ug/kg/h via INTRAVENOUS
  Filled 2018-09-02 (×11): qty 100

## 2018-09-02 MED ORDER — SODIUM CHLORIDE 0.9% IV SOLUTION
Freq: Once | INTRAVENOUS | Status: AC
  Administered 2018-09-02: 04:00:00 via INTRAVENOUS

## 2018-09-02 NOTE — Interval H&P Note (Signed)
History and Physical Interval Note:  09/09/2018 8:37 AM  Mackenzie Little  has presented today for surgery, with the diagnosis of Melena, acute blood loss anemia, portal hypertensive gastropathy.  The various methods of treatment have been discussed with the patient and family. After consideration of risks, benefits and other options for treatment, the patient has consented to  Procedure(s): ESOPHAGOGASTRODUODENOSCOPY (EGD) (N/A) as a surgical intervention.  The patient's history has been reviewed, patient examined, no change in status, stable for surgery.  I have reviewed the patient's chart and labs.  Questions were answered to the patient's satisfaction.     [place], [place]

## 2018-09-02 NOTE — Progress Notes (Addendum)
Name: Mackenzie Little MRN: [id number] DOB: [birth year]     CONSULTATION DATE: 09/10/2018  CHIEF COMPLAINT:  GI bleed.   HISTORY OF PRESENT ILLNESS:  53 year old female with past history of HTN, alcohol abuse, portal HTN, and PUD being seen in the ICU fo further management of hypovolemic/septic shock secondary to acute GI bleed, likely alcoholic hepatitis, w/ concomitant AKI, and current evidence of DT. On 8/5, she presented to the ED complaining of melena x 2 weeks, with weakness and a single episode of N/V. Initial vital signs indicated hypoTN with SBP < 80. Initial labs with evidence of acute blood loss (Hgb 6.5), and elevated INR, elevated troponins, and evidence of AKI (Cr 2.47, GFR 25). She received LR, octreotide, vitamin K, and protonix in the ED, and was transferred to the ICU.   In the ICU she had a central line placed, and was started on vasopressors to maintain MAP >65.  She also received 2U PRBC and FFP last night.   GI is following the patient, and was planning on performing an EGD this AM. However, given her critically ill condition, and evidence of multi-organ failure, they have decided to post-pone the procedure and reassess tomorrow. Plan for paracentesis today, pending INR results.   Today she is obtunded, and not responsive to verbal or painful stimuli. She is on Precedex, though we have lowered her dose now to 1 mcg/kg/hr. Unable to perform ROS at this time given patient's AMS.   SIGNIFICANT EVENTS: - 09/08/2018: ED visit c/o melena and weakness. Hgb low, with hypotension. Started on vit K, octreotide, Protonix and IVF (LR). Brought to ICU. Central line placed. Required vasopressors. GI consulted.  OVERNIGHT EVENTS: Patient began to exhibit S/S of DT, including agitation. Precedex was started. She also had 1 BM that was consistent with melena. She remained on 2L O2 via [place].   PAST MEDICAL HISTORY :   has a past medical history of Hypertension.  has a past surgical history  that includes Tooth extraction (May 2016); Esophagogastroduodenoscopy (N/A, 07/07/2017); and Colonoscopy (N/A, 07/07/2017). Prior to Admission medications   Medication Sig Start Date End Date Taking? Authorizing Provider  acetaminophen (TYLENOL) 325 MG tablet Take 650 mg by mouth every 6 (six) hours as needed.   Yes [provider]   No Known Allergies   REVIEW OF SYSTEMS:   Unable to obtain due to critical illness.   VITAL SIGNS: Temp:  [98 F (36.7 C)-100 F (37.8 C)] 100 F (37.8 C) (08/06 0800) Pulse Rate:  [60-98] 83 (08/06 0930) Resp:  [11-28] 16 (08/06 0930) BP: (63-146)/(31-94) 101/50 (08/06 0930) SpO2:  [93 %-100 %] 100 % (08/06 0930) Weight:  [75.4 kg-77.1 kg] 75.4 kg (08/05 2030)   I/O last 3 completed shifts: In: 3299 [I.V.:1360.4; MEQAS:3419; IV Piggyback:740.6] Out: -  No intake/output data recorded.   SpO2: 100 % O2 Flow Rate (L/min): 2 L/min  Physical Examination:  GENERAL: Critically ill appearing. Obtunded. Calm.   HEAD: Normocephalic, atraumatic.  EYES: Pupils equal, round, minimally reactive to light.  No scleral icterus.  MOUTH: Dry mucosal membranes. NECK: Supple. No JVD.  PULMONARY: Clear to auscultation bilaterally. O2 via [place] with no increased WOB.  CARDIOVASCULAR: Loud S1 and S2. Regular rate and rhythm. No murmurs, rubs, or gallops.  GASTROINTESTINAL: Impressive abdominal ascites, more firm in epigastric region. + Fluid wave. Non-tender. Hypoactive bowel sounds. Unable to assess for HSM.  EXTREMITIES: No evidence of edema. DP pulses 1+ bilaterally. Radial pulses 2+ bilaterally.  NEUROLOGIC: Obtunded. Will not wake to verbal, or painful stimuli. Unable to follow commands. DTRs 1+ at achilles, and patella.  SKIN: intact, warm, dry   MEDICATIONS: I have reviewed all medications and confirmed regimen as documented CBC Latest Ref Rng & Units 09/03/2018 09/13/2018 09/14/2018  WBC 4.0 - 10.5 K/uL - 20.1(H) -  Hemoglobin 12.0 - 15.0 g/dL 7.4(L)  6.2(L) 6.4(L)  Hematocrit 36.0 - 46.0 % 22.0(L) 18.6(L) 18.9(L)  Platelets 150 - 400 K/uL - 144(L) -   CMP Latest Ref Rng & Units 09/16/2018 08/30/2018 07/08/2017  Glucose 70 - 99 mg/dL 127(H) 38(LL) 74  BUN 6 - 20 mg/dL 62(H) 41(H) <5(L)  Creatinine 0.44 - 1.00 mg/dL 3.00(H) 2.47(H) 0.51  Sodium 135 - 145 mmol/L 134(L) 133(L) 139  Potassium 3.5 - 5.1 mmol/L 3.9 3.9 4.5  Chloride 98 - 111 mmol/L 102 93(L) 108  CO2 22 - 32 mmol/L 18(L) 19(L) 23  Calcium 8.9 - 10.3 mg/dL 6.6(L) 7.6(L) 7.8(L)  Total Protein 6.5 - 8.1 g/dL - 7.3 -  Total Bilirubin 0.3 - 1.2 mg/dL - 8.6(H) -  Alkaline Phos 38 - 126 U/L - 80 -  AST 15 - 41 U/L - 92(H) -  ALT 0 - 44 U/L - 28 -  GFR: 20 (previously 25 on 09/23/2018), baseline - 1 yr ago = >60. Procalcitonin: 2.71 (previously 2.27 09/04/2018 HS Troponins: 608 (previously 524, 512, 454 from most recent to oldest)  Lactic Acid, Venous    Component Value Date/Time   LATICACIDVEN 2.0 ([place]) 09/27/2018 0827   CULTURE RESULTS   Recent Results (from the past 240 hour(s))  Culture, blood (routine x 2)     Status: None (Preliminary result)   Collection Time: 09/07/2018  1:25 PM   Specimen: BLOOD  Result Value Ref Range Status   Specimen Description BLOOD LEFT ANTECUBITAL  Final   Special Requests   Final    BOTTLES DRAWN AEROBIC AND ANAEROBIC Blood Culture adequate volume   Culture   Final    NO GROWTH < 24 HOURS Performed at [facility], [address], [place], [place] [address]    Report Status PENDING  Incomplete  Culture, blood (routine x 2)     Status: None (Preliminary result)   Collection Time: 09/04/2018  1:39 PM   Specimen: BLOOD  Result Value Ref Range Status   Specimen Description BLOOD RIGHT ANTECUBITAL  Final   Special Requests   Final    BOTTLES DRAWN AEROBIC AND ANAEROBIC Blood Culture results may not be optimal due to an excessive volume of blood received in culture bottles   Culture   Final    NO GROWTH < 24 HOURS Performed at [facility], [address] [place],  [address]    Report Status PENDING  Incomplete  SARS Coronavirus 2 [facility] order, Performed in [place] hospital lab)     Status: None   Collection Time: 09/23/2018  4:22 PM  Result Value Ref Range Status   SARS Coronavirus 2 NEGATIVE NEGATIVE Final    Comment: (NOTE) SARS CoV 2 target nucleic acids are NOT DETECTED. The SARS CoV 2 RNA is generally detectable in upper and lower respiratory specimens during the acute phase of infection. The lowest concentration of SARS CoV 2 viral copies this assay can detect is 250 copies per mL. A negative result does not preclude SARS CoV 2 infection and should not be used as the sole basis for treatment or other patient management decisions. A negative result  may occur with improper specimen collection and handling, submission  of specimen other than nasopharyngeal swab, presence of viral mutation(s) within the areas targeted by this assay, and inadequate number of viral copies (less than 250 copies per mL). A negative result must be combined with clinical observations, patient history,  and epidemiological information. The expected result is Negative. Fact Sheet for Patients:   https [employer] media 944967 download Fact Sheet for Healthcare Providers:   https [employer] media 6037918633 d Jenel Lucks This test is not yet approved or cleared by the [place] FDA and  has been authorized for detection and/or diagnosis of SARS CoV 2 by FDA under an Emergency Use Authorization (EUA).  This EUA will remain in effect (meaning this test can be used) for the duration of  the COVID19 declaration under Section 564(b)(1) of the Act, 21 U.S.C.  section 360bbb-3(b)(1), unless the authorization is terminated or revoked sooner. Performed at [facility], [address] [place], [place] [address]   MRSA PCR Screening     Status: None   Collection Time: 09/27/2018  8:38 PM   Specimen: Nasal Mucosa;  Nasopharyngeal  Result Value Ref Range Status   MRSA by PCR NEGATIVE NEGATIVE Final    Comment:        The GeneXpert MRSA Assay (FDA approved for NASAL specimens only), is one component of a comprehensive MRSA colonization surveillance program. It is not intended to diagnose MRSA infection nor to guide or monitor treatment for MRSA infections. Performed at [facility], [address], [place], [place] [address]       External female Urinary Catheter continued, requirement due to   Reason to continue Urinary Catheter strict Intake/Output monitoring for hemodynamic instability   Femoral Central Line/ continued, requirement due to  Reason to continue Central Line Monitoring of central venous pressure or other hemodynamic parameters and poor IV access    ASSESSMENT AND PLAN SYNOPSIS: 53 year old female with history as stated above seen for management of septic/hypovolemic shock with concomitant AKI, secondary to acute GI bleed, currently going through DT secondary to history of alcohol abuse. Patient's prognosis remains guarded, with evidence of multi organ failure on labs (AKI, liver dysfunction)   SHOCK-SEPSIS/HYPOVOLUMIC - use vasopressors to keep MAP>65. Will start NS at 2L to improve MAP, with hopes to wean her off Levophed. - follow ABG and LA, trend fever curve.  -follow up cultures. Hepatitis panel completed and pending. HIV pending - emperic ABX with cefepime and flagyl for suspected GI infectious etiology. Will d/c Vancomycin given MRSA negative status. -consider stress dose steroids  Acute GI Bleed - GI following.  - Plan for EGD tomorrow. - Continue Octreotide and Protonix. - Will continue to trend H/H. Continue q 4 hour draws. - PRBC/FFP as needed for Hgb < 7 and/or worsening coagulopathy on aPTT, PT/INR.   ACUTE KIDNEY INJURY/Renal Failure - Will place foley catheter to monitor inputs and outputs.  -follow chem 7 -follow UO -continue Foley  Catheter-assess need -Avoid nephrotoxic agents  NEUROLOGY - Delirium tremens secondary to chronic alcohol use withdrawal - Continue Precedex. Wean as tolerated.  - Start Ativan q 4 hours PRN for agitation. - Patient not currently agitated, and is able to protect her airway. Continue CIWA protocol, but d/c hourly neurochecks  CARDIAC - Cardiology signed off. No further recommendations. - Will d/c trending of troponins.  - Continue ICU monitoring  ID -continue IV abx as prescibed -follow up cultures Paracentesis planned for today.  ELECTROLYTES -follow labs as needed -replace as needed -pharmacy consultation and following  GI GI PROPHYLAXIS as indicated  NUTRITIONAL STATUS DIET-->NPO Constipation protocol as indicated  ENDO - will use ICU hypoglycemic\Hyperglycemia protocol if needed On d10 infusion for severe hypoglycemia  DVT/GI PRX ordered TRANSFUSIONS AS NEEDED MONITOR FSBS ASSESS the need for LABS    Critical Care Time devoted to patient care services described in this note is 45 minutes.   Overall, patient is critically ill, prognosis is guarded.  Patient with Multiorgan failure and at high risk for cardiac arrest and death.   Husband updated and notified of prognosis  Layney Gillson Patricia Pesa, M.D.  [facility]  Medical Director [place] Director [facility] Cardio-Pulmonary Department

## 2018-09-02 NOTE — Consult Note (Signed)
Cardiology Consultation Note    Patient ID: Mackenzie Little, MRN: [id number], DOB/AGE: 1965/06/03 53 y.o. Admit date: 09/17/2018   Date of Consult: 08/31/2018 Primary Physician: Ranae Plumber, [place] Primary Cardiologist:   Chief Complaint: gi bleed/hypotension  Reason for Consultation: abnormal troponin Requesting MD: Dr. Max Sane  HPI: Mackenzie Little is a 53 y.o. female with history of hypertension, ethanol abuse who presented to the emergency room with 3 days of fatigue and dark tarry stools.  She was vomiting blood.  In the emergency room her hemoglobin was 6.5 with an INR of 2.3.  She was given 2 units of blood cells and FFP.  She became hypotensive requiring pressure support.  At the time of my exam she was sedated not responsive on pressors.  Troponins were drawn apparently by protocol with high-sensitivity troponin of 512 on presentation 597 subsequently.  EKG showed sinus rhythm with nonspecific ST-T wave changes.  Her albumin is 2.4 AST was 92 bilirubin direct was 5.1 total bilirubin of 8.6.  Potassium is 3.9 sodium 133, glucose was 38.  Creatinine was 2.47 BUN 41.  Her baseline creatinine was 0.51.  Past Medical History:  Diagnosis Date  . Hypertension       Surgical History:  Past Surgical History:  Procedure Laterality Date  . COLONOSCOPY N/A 07/07/2017   Procedure: COLONOSCOPY;  Surgeon: Lin Landsman, MD;  Location[facility] ENDOSCOPY;  Service: Gastroenterology;  Laterality: N/A;  . ESOPHAGOGASTRODUODENOSCOPY N/A 07/07/2017   Procedure: ESOPHAGOGASTRODUODENOSCOPY (EGD);  Surgeon: Lin Landsman, MD;  Location: [facility] ENDOSCOPY;  Service: Gastroenterology;  Laterality: N/A;  . TOOTH EXTRACTION  May 2016     Home Meds: Prior to Admission medications   Medication Sig Start Date End Date Taking? Authorizing Provider  acetaminophen (TYLENOL) 325 MG tablet Take 650 mg by mouth every 6 (six) hours as needed.   Yes [provider]    Inpatient Medications:   . Chlorhexidine Gluconate Cloth  6 each Topical QHS  . pantoprazole (PROTONIX) IV  40 mg Intravenous Once  . [START ON 09/05/2018] pantoprazole  40 mg Intravenous Q12H  . pentoxifylline  400 mg Oral TID WC  . sodium chloride flush  10-40 mL Intracatheter Q12H   . sodium chloride 5 mL/hr at 09/13/2018 0600  . ceFEPime (MAXIPIME) IV Stopped (09/20/2018 2351)  . dexmedetomidine (PRECEDEX) IV infusion 1 mcg/kg/hr (09/08/2018 0936)  . metronidazole    . norepinephrine (LEVOPHED) Adult infusion 20 mcg/min (09/03/2018 0600)  . octreotide  (SANDOSTATIN)    IV infusion 50 mcg/hr (09/13/2018 0932)  . pantoprozole (PROTONIX) infusion 8 mg/hr (09/26/2018 0932)  . sodium chloride      Allergies: No Known Allergies  Social History   Socioeconomic History  . Marital status: Married    Spouse name: Not on file  . Number of children: Not on file  . Years of education: Not on file  . Highest education level: Not on file  Occupational History  . Not on file  Social Needs  . Financial resource strain: Not on file  . Food insecurity    Worry: Not on file    Inability: Not on file  . Transportation needs    Medical: Not on file    Non-medical: Not on file  Tobacco Use  . Smoking status: Former Research scientist (life sciences)  . Smokeless tobacco: Never Used  Substance and Sexual Activity  . Alcohol use: Yes  . Drug use: No  . Sexual activity: Yes  Lifestyle  . Physical  activity    Days per week: Not on file    Minutes per session: Not on file  . Stress: Not on file  Relationships  . Social Herbalist on phone: Not on file    Gets together: Not on file    Attends religious service: Not on file    Active member of club or organization: Not on file    Attends meetings of clubs or organizations: Not on file    Relationship status: Not on file  . Intimate partner violence    Fear of current or ex partner: Not on file    Emotionally abused: Not on file    Physically abused: Not on file    Forced sexual  activity: Not on file  Other Topics Concern  . Not on file  Social History Narrative  . Not on file     History reviewed. No pertinent family history.   Review of Systems: A 12-system review of systems was performed and is negative except as noted in the HPI.  Labs: No results for input(s): CKTOTAL, CKMB, TROPONINI in the last 72 hours. Lab Results  Component Value Date   WBC 20.1 (H) 09/23/2018   HGB 7.4 (L) 09/25/2018   HCT 22.0 (L) 09/11/2018   MCV 83.0 09/12/2018   PLT 144 (L) 09/17/2018    Recent Labs  Lab 09/23/2018 1315 09/22/2018 0455  NA 133* 134*  K 3.9 3.9  CL 93* 102  CO2 19* 18*  BUN 41* 62*  CREATININE 2.47* 3.00*  CALCIUM 7.6* 6.6*  PROT 7.3  --   BILITOT 8.6*  --   ALKPHOS 80  --   ALT 28  --   AST 92*  --   GLUCOSE 38* 127*   Lab Results  Component Value Date   CHOL 233 (H) 10/17/2015   HDL 79.10 10/17/2015   LDLCALC 128 (H) 10/17/2015   TRIG 130.0 10/17/2015   No results found for: DDIMER  Radiology/Studies:  US Abdomen Limited  Result Date: 09/24/2018 CLINICAL DATA:  53 year old with history of cirrhosis. Evaluate for ascites. EXAM: LIMITED ABDOMEN ULTRASOUND FOR ASCITES TECHNIQUE: Limited ultrasound survey for ascites was performed in all four abdominal quadrants. COMPARISON:  MRI 07/06/2017 FINDINGS: No ascites identified. Fluid in the urinary bladder. Evidence for fluid within bowel structures. IMPRESSION: Negative for ascites. Electronically Signed   By: Markus Daft M.D.   On: 09/23/2018 12:01    Wt Readings from Last 3 Encounters:  09/23/2018 75.4 kg  07/06/17 82.4 kg  11/29/15 93 kg    EKG: Sinus rhythm with no ischemia  Physical Exam: Unresponsive acutely ill female Blood pressure (!) 101/50, pulse 83, temperature 100 F (37.8 C), temperature source Axillary, resp. rate 16, height 5\' 6"  (1.676 m), weight 75.4 kg, SpO2 100 %. Body mass index is 26.83 kg/m. General: Well developed, well nourished, in no acute distress. Head:  Normocephalic, atraumatic, sclera non-icteric, no xanthomas, nares are without discharge.  Neck: Negative for carotid bruits. JVD not elevated. Lungs: Clear bilaterally to auscultation without wheezes, rales, or rhonchi. Breathing is unlabored. Heart: RRR with S1 S2. No murmurs, rubs, or gallops appreciated. Abdomen: Soft, non-tender, non-distended with normoactive bowel sounds. No hepatomegaly. No rebound/guarding. No obvious abdominal masses. Msk:  Strength and tone appear normal for age. Extremities: No clubbing or cyanosis. No edema.  Distal pedal pulses are 2+ and equal bilaterally. Neuro: Alert and oriented X 3. No facial asymmetry. No focal deficit. Moves all extremities spontaneously. Psych:  Responds to questions appropriately with a normal affect.     Assessment and Plan  53 year old female with history of severe ethanol abuse presenting with weakness fatigue noted to have a GI bleed with a hemoglobin below 7 with acute renal insufficient with a creatinine of 2.47 BUN of 41.  She is hypotensive.  EKG showed no ischemia.  Patient had fatigue on presentation but no chest pain.  High-sensitivity troponin was drawn and has been fairly flat in the mid 500s.  This does not appear to raise represent acute coronary event.  Likely demand ischemia due to renal failure, anemia.  Patient had no symptoms of coronary disease and EKG showed no ischemia.  Not a candidate for invasive cardiac evaluation.  We will treat her multisystem failure.  Further cardiac work-up pending patient's recovery from her acute event.  Ivin Booty MD 09/11/2018, 12:25 PM Pager: [phone]

## 2018-09-02 NOTE — Progress Notes (Signed)
[place] [facility] [place] NAME: Mackenzie Little    MR#:  [id number]  DATE OF BIRTH:  08-08-1965  SUBJECTIVE:  CHIEF COMPLAINT:   Chief Complaint  Patient presents with  . Constipation  . Chest Pain  Looks critically sick, obtunded REVIEW OF SYSTEMS:  ROS unable to obtain due to critical sickness and mental status DRUG ALLERGIES:  No Known Allergies VITALS:  Blood pressure (!) 118/48, pulse 79, temperature 99.9 F (37.7 C), temperature source Axillary, resp. rate 19, height 5\' 6"  (1.676 m), weight 75.4 kg, SpO2 99 %. PHYSICAL EXAMINATION:  Physical Exam Constitutional:      Appearance: She is ill-appearing and toxic-appearing.  HENT:     Head: Normocephalic and atraumatic.  Eyes:     Conjunctiva/sclera: Conjunctivae normal.     Pupils: Pupils are equal, round, and reactive to light.  Neck:     Musculoskeletal: Normal range of motion and neck supple.     Thyroid: No thyromegaly.     Trachea: No tracheal deviation.  Cardiovascular:     Rate and Rhythm: Normal rate and regular rhythm.     Heart sounds: Normal heart sounds.  Pulmonary:     Effort: Pulmonary effort is normal. No respiratory distress.     Breath sounds: Decreased breath sounds present. No wheezing.  Chest:     Chest wall: No tenderness.  Abdominal:     General: Bowel sounds are decreased. There is no distension.     Palpations: There is fluid wave.     Tenderness: There is generalized abdominal tenderness.  Musculoskeletal: Normal range of motion.  Skin:    General: Skin is warm and dry.     Findings: No rash.  Neurological:     Mental Status: She is lethargic.     Cranial Nerves: No cranial nerve deficit.  Psychiatric:        Behavior: Behavior is uncooperative.     Comments: Unable to evaluate due to critical sickness and her mental status    LABORATORY PANEL:  Female CBC Recent Labs  Lab 09/18/2018 0455 09/10/2018 0827  WBC 20.1*  --   HGB 6.2*  7.4*  HCT 18.6* 22.0*  PLT 144*  --    ------------------------------------------------------------------------------------------------------------------ Chemistries  Recent Labs  Lab 09/08/2018 1315 09/27/2018 0455  NA 133* 134*  K 3.9 3.9  CL 93* 102  CO2 19* 18*  GLUCOSE 38* 127*  BUN 41* 62*  CREATININE 2.47* 3.00*  CALCIUM 7.6* 6.6*  AST 92*  --   ALT 28  --   ALKPHOS 80  --   BILITOT 8.6*  --    RADIOLOGY:  US Abdomen Limited  Result Date: 08/29/2018 CLINICAL DATA:  53 year old with history of cirrhosis. Evaluate for ascites. EXAM: LIMITED ABDOMEN ULTRASOUND FOR ASCITES TECHNIQUE: Limited ultrasound survey for ascites was performed in all four abdominal quadrants. COMPARISON:  MRI 07/06/2017 FINDINGS: No ascites identified. Fluid in the urinary bladder. Evidence for fluid within bowel structures. IMPRESSION: Negative for ascites. Electronically Signed   By: Markus Daft M.D.   On: 09/24/2018 12:01   ASSESSMENT AND PLAN:  53 year old female patient with a known history of hypertension, alcohol abuse presented to the emergency room for generalized weakness.  Patient has been fatigued for the last 3 days and noticed dark tarry stool for the last couple of days.   *Hypovolemic shock and multiorgan failure -due to active GI bleed -  Initial  labs with evidence of acute blood loss (Hgb 6.5), and elevated INR, elevated troponins, and evidence of AKI (Cr 2.47, GFR 25) - on vasopressors to maintain MAP >65. Status post 2U PRBC and FFP last night.   -Acute gastrointestinal bleeding -on octreotide and Protonix drip - melena x 2 weeks, received LR, octreotide, vitamin K, and protonix  - GI is following the patient, and was planning on performing an EGD this AM. However, given her critically ill condition, and evidence of multi-organ failure, they have decided to post-pone the procedure and reassess tomorrow. Plan for paracentesis today, pending INR results  -Acute symptomatic severe anemia  - status post transfuse 2 units PRBC   -Acute kidney injury: Likely due to ATN due to hypotension Avoid nephrotoxic medications IV fluids and monitor renal function  -Elevated lactic acid -likely due to multiorgan failure and hypotension/dehydration Follow-up levels Empiric broad spectrum antibiotics  -Elevated troponins  -Likely from demand ischemia   Patient looks critically sick with multiorgan failure and active GI bleed.  she may not survive this admission.  Case discussed with ICU team  All the records are reviewed and case discussed with Care Management/Social Worker. Management plans discussed with the patient, intensivist and they are in agreement.  CODE STATUS: Full Code  TOTAL TIME TAKING CARE OF THIS PATIENT: 15 minutes.   More than 50% of the time was spent in counseling/coordination of care: Valaria Good M.D on 09/15/2018 at 1:06 PM  Between 7am to 6pm - Pager - [phone]  After 6pm go to [email or web address] - password [email or web address] [facility]  [employer] [facility] [place] [facility]  Office  [phone]  CC: Primary care physician; Ranae Plumber, [place]  Note: This dictation was prepared with Dragon dictation along with smaller phrase technology. Any transcriptional errors that result from this process are unintentional.

## 2018-09-02 NOTE — Progress Notes (Signed)
GI Inpatient Follow-up Note  Subjective:  Patient seen and examined. Overnight events reviewed in the chart. She reportedly had one BM overnight which was tarry. Patient is currently in ICU and is critically-ill appearing. She had a central line placed last night and was started on vasopressors. She has received 2 units of pRBCs and FFP. EGD was originally planned for this morning, but was cancelled given her critical illness and multi-organ failure. Pt is not able to contribute to the HPI given her mental status. RUQ [place] today showed no abdominopelvic ascites, cirrhotic appearing liver w/o definite mass. Labs this afternoon showed hgb 7.0, INR 1.8. Liver enzymes were not rechecked.   Scheduled Inpatient Medications:  . Chlorhexidine Gluconate Cloth  6 each Topical QHS  . pantoprazole (PROTONIX) IV  40 mg Intravenous Once  . [START ON 09/05/2018] pantoprazole  40 mg Intravenous Q12H  . pentoxifylline  400 mg Oral TID WC  . sodium chloride flush  10-40 mL Intracatheter Q12H    Continuous Inpatient Infusions:   . sodium chloride Stopped (08/29/2018 1130)  . ceFEPime (MAXIPIME) IV Stopped (09/03/2018 2351)  . dexmedetomidine (PRECEDEX) IV infusion 0.7 mcg/kg/hr (09/25/2018 1450)  . metronidazole 500 mg (09/18/2018 1445)  . norepinephrine (LEVOPHED) Adult infusion 23 mcg/min (09/07/2018 1400)  . octreotide  (SANDOSTATIN)    IV infusion 50 mcg/hr (08/28/2018 1400)  . pantoprozole (PROTONIX) infusion 8 mg/hr (09/17/2018 1400)    PRN Inpatient Medications:  sodium chloride, acetaminophen **OR** acetaminophen, LORazepam, ondansetron **OR** ondansetron (ZOFRAN) IV, sodium chloride flush, traMADol  Review of Systems: Unable to ascertain due to patient's mental status    Physical Examination: BP (!) 130/52   Pulse 77   Temp 99.9 F (37.7 C) (Axillary)   Resp 20   Ht 5\' 6"  (1.676 m)   Wt 75.4 kg   SpO2 100%   BMI 26.83 kg/m   Critically ill appearing female. Obtunded.  HEENT: normocephalic, atraumatic   Neck: supple, no JVD or thyromegaly Chest: CTA bilaterally, no wheezes, crackles, or other adventitious sounds CV: RRR, no m/g/c/r Abd: mildly distended, hypoactive bowel sounds, no tenderness to palpation, + ascites Ext: no edema, well perfused with 2+ pulses, Skin: no rash or lesions noted Lymph: no LAD  Data: Lab Results  Component Value Date   WBC 20.1 (H) 08/28/2018   HGB 7.0 (L) 09/06/2018   HCT 21.0 (L) 08/28/2018   MCV 83.0 09/15/2018   PLT 144 (L) 08/30/2018   Recent Labs  Lab 09/03/2018 0455 09/11/2018 0827 09/01/2018 1444  HGB 6.2* 7.4* 7.0*   Lab Results  Component Value Date   NA 134 (L) 09/20/2018   K 3.9 09/20/2018   CL 102 08/31/2018   CO2 18 (L) 09/14/2018   BUN 62 (H) 08/28/2018   CREATININE 3.00 (H) 09/12/2018   Lab Results  Component Value Date   ALT 28 09/23/2018   AST 92 (H) 09/20/2018   ALKPHOS 80 09/08/2018   BILITOT 8.6 (H) 09/24/2018   Recent Labs  Lab 09/15/2018 0827  INR 1.8*   Assessment/Plan: Ms. Neubecker is a 53 y.o. female   53 y/o AA female admitted for generalized weakness, melena x 2 weeks  1. Alcoholic cirrhosis with portal hypertensive gastropathy  2. Acute GI bleeding - melena x 2 weeks 3. Alcoholic hepatitis - Maddrey score 63.3 (calculated with INR 2.3, Bilirubin 8.6) 4. Hypovolemic shock - continue care per intensivist  5. Acute kidney injury, renal failure - foley in place  - Clinical presentation and labs are consistent  with alcoholic hepatitis in the setting of cirrhosis - Continue Trental 400 mg TID for treatment of alcoholic hepatitis - RUQ [place] today negative for ascites - EGD was cancelled today due to her critical illness. One episode of melena overnight - Continue to monitor H&H. Tranfuse as necessary to maintain hgb <7.0. - FFP as needed for worsening coagulopathy - Continue octreotide for at least 72 hours - Continue Protonix - Plan for EGD when clinically feasible. Given her critical illness, consideration  for EGD in the ICU - GI will continue to follow along - Continue intensivist care  Please call with questions or concerns.    Octavia Bruckner, PA-C [facility] [phone] (Cell)

## 2018-09-02 NOTE — Progress Notes (Signed)
clinical status relayed to family-husband  Updated and notified of patients medical condition-  Progressive multiorgan failure -liver failure and kidney failure  Patient may not survive this admission--this was relayed to husband   Corrin Parker, M.D.  [facility]  Medical Director [place] Director [facility] Cardio-Pulmonary Department

## 2018-09-03 DIAGNOSIS — K7030 Alcoholic cirrhosis of liver without ascites: Secondary | ICD-10-CM

## 2018-09-03 LAB — COMPREHENSIVE METABOLIC PANEL
ALT: 28 U/L (ref 0–44)
AST: 102 U/L — ABNORMAL HIGH (ref 15–41)
Albumin: 2 g/dL — ABNORMAL LOW (ref 3.5–5.0)
Alkaline Phosphatase: 49 U/L (ref 38–126)
Anion gap: 6 (ref 5–15)
BUN: 50 mg/dL — ABNORMAL HIGH (ref 6–20)
CO2: 20 mmol/L — ABNORMAL LOW (ref 22–32)
Calcium: 6.7 mg/dL — ABNORMAL LOW (ref 8.9–10.3)
Chloride: 113 mmol/L — ABNORMAL HIGH (ref 98–111)
Creatinine, Ser: 1.22 mg/dL — ABNORMAL HIGH (ref 0.44–1.00)
GFR calc Af Amer: 59 mL/min — ABNORMAL LOW (ref >60)
GFR calc non Af Amer: 51 mL/min — ABNORMAL LOW (ref >60)
Glucose, Bld: 111 mg/dL — ABNORMAL HIGH (ref 70–99)
Potassium: 2.9 mmol/L — ABNORMAL LOW (ref 3.5–5.1)
Sodium: 139 mmol/L (ref 135–145)
Total Bilirubin: 10.6 mg/dL — ABNORMAL HIGH (ref 0.3–1.2)
Total Protein: 6 g/dL — ABNORMAL LOW (ref 6.5–8.1)

## 2018-09-03 LAB — HEPATITIS PANEL, ACUTE
HCV Ab: 0.2 s/co ratio (ref 0.0–0.9)
Hep A IgM: NEGATIVE
Hep B C IgM: NEGATIVE
Hepatitis B Surface Ag: NEGATIVE

## 2018-09-03 LAB — PREPARE FRESH FROZEN PLASMA: Unit division: 0

## 2018-09-03 LAB — HEMOGLOBIN AND HEMATOCRIT, BLOOD
HCT: 20.6 % — ABNORMAL LOW (ref 36.0–46.0)
HCT: 20.2 % — ABNORMAL LOW (ref 36.0–46.0)
Hemoglobin: 6.9 g/dL — ABNORMAL LOW (ref 12.0–15.0)
Hemoglobin: 6.9 g/dL — ABNORMAL LOW (ref 12.0–15.0)

## 2018-09-03 LAB — BPAM FFP
Blood Product Expiration Date: 202008102359
ISSUE DATE / TIME: 202008060141
Unit Type and Rh: 9500

## 2018-09-03 LAB — GLUCOSE, CAPILLARY
Glucose-Capillary: 103 mg/dL — ABNORMAL HIGH (ref 70–99)
Glucose-Capillary: 104 mg/dL — ABNORMAL HIGH (ref 70–99)
Glucose-Capillary: 104 mg/dL — ABNORMAL HIGH (ref 70–99)
Glucose-Capillary: 106 mg/dL — ABNORMAL HIGH (ref 70–99)
Glucose-Capillary: 108 mg/dL — ABNORMAL HIGH (ref 70–99)
Glucose-Capillary: 121 mg/dL — ABNORMAL HIGH (ref 70–99)
Glucose-Capillary: 136 mg/dL — ABNORMAL HIGH (ref 70–99)
Glucose-Capillary: 96 mg/dL (ref 70–99)
Glucose-Capillary: 99 mg/dL (ref 70–99)

## 2018-09-03 LAB — PHOSPHORUS: Phosphorus: 1.5 mg/dL — ABNORMAL LOW (ref 2.5–4.6)

## 2018-09-03 LAB — CBC
HCT: 18.5 % — ABNORMAL LOW (ref 36.0–46.0)
Hemoglobin: 6.3 g/dL — ABNORMAL LOW (ref 12.0–15.0)
MCH: 27.8 pg (ref 26.0–34.0)
MCHC: 34.1 g/dL (ref 30.0–36.0)
MCV: 81.5 fL (ref 80.0–100.0)
Platelets: 74 10*3/uL — ABNORMAL LOW (ref 150–400)
RBC: 2.27 MIL/uL — ABNORMAL LOW (ref 3.87–5.11)
RDW: 17.5 % — ABNORMAL HIGH (ref 11.5–15.5)
WBC: 8.4 10*3/uL (ref 4.0–10.5)
nRBC: 0.7 % — ABNORMAL HIGH (ref 0.0–0.2)

## 2018-09-03 LAB — PROCALCITONIN: Procalcitonin: 2.58 ng/mL

## 2018-09-03 LAB — MAGNESIUM: Magnesium: 1.1 mg/dL — ABNORMAL LOW (ref 1.7–2.4)

## 2018-09-03 LAB — PROTIME-INR
INR: 1.6 — ABNORMAL HIGH (ref 0.8–1.2)
Prothrombin Time: 19.2 seconds — ABNORMAL HIGH (ref 11.4–15.2)

## 2018-09-03 LAB — PREPARE RBC (CROSSMATCH)

## 2018-09-03 LAB — HIV ANTIBODY (ROUTINE TESTING W REFLEX): HIV Screen 4th Generation wRfx: NONREACTIVE

## 2018-09-03 MED ORDER — SODIUM CHLORIDE 0.9 % IV SOLN
2.0000 g | Freq: Two times a day (BID) | INTRAVENOUS | Status: DC
  Administered 2018-09-03 – 2018-09-04 (×2): 2 g via INTRAVENOUS
  Filled 2018-09-03 (×3): qty 2

## 2018-09-03 MED ORDER — POTASSIUM PHOSPHATES 15 MMOLE/5ML IV SOLN
20.0000 mmol | Freq: Once | INTRAVENOUS | Status: AC
  Administered 2018-09-03: 20 mmol via INTRAVENOUS
  Filled 2018-09-03: qty 6.67

## 2018-09-03 MED ORDER — SODIUM CHLORIDE 0.9% IV SOLUTION
Freq: Once | INTRAVENOUS | Status: AC
  Administered 2018-09-03: 10:00:00 via INTRAVENOUS

## 2018-09-03 MED ORDER — POTASSIUM CHLORIDE 10 MEQ/100ML IV SOLN
10.0000 meq | INTRAVENOUS | Status: AC
  Administered 2018-09-03 (×4): 10 meq via INTRAVENOUS
  Filled 2018-09-03 (×4): qty 100

## 2018-09-03 MED ORDER — LORAZEPAM 2 MG/ML IJ SOLN
2.0000 mg | Freq: Once | INTRAMUSCULAR | Status: AC
  Administered 2018-09-03: 21:00:00 2 mg via INTRAVENOUS

## 2018-09-03 MED ORDER — MAGNESIUM SULFATE 4 GM/100ML IV SOLN
4.0000 g | Freq: Once | INTRAVENOUS | Status: AC
  Administered 2018-09-03: 4 g via INTRAVENOUS
  Filled 2018-09-03: qty 100

## 2018-09-03 MED ORDER — SODIUM CHLORIDE 0.9% IV SOLUTION
Freq: Once | INTRAVENOUS | Status: AC
  Administered 2018-09-04: via INTRAVENOUS

## 2018-09-03 NOTE — Progress Notes (Signed)
PHARMACY NOTE:  ANTIMICROBIAL RENAL DOSAGE ADJUSTMENT  Current antimicrobial regimen includes a mismatch between antimicrobial dosage and estimated renal function.  As per policy approved by the Pharmacy & Therapeutics and Medical Executive Committees, the antimicrobial dosage will be adjusted accordingly.  Current antimicrobial dosage:  Cefepime 2 g IV q24h  Indication: sepsis/intra-abdominal infection  Renal Function:  Estimated Creatinine Clearance: 56.6 mL/min (A) (by C-G formula based on SCr of 1.22 mg/dL (H)).    Antimicrobial dosage has been changed to:  Cefepime 2 g IV q12h   Thank you for allowing pharmacy to be a part of this patient's care.  Tawnya Crook, [facility] 09/03/2018 1:30 PM

## 2018-09-03 NOTE — Progress Notes (Signed)
PHARMACY CONSULT NOTE - FOLLOW UP  Pharmacy Consult for Electrolyte Monitoring and Replacement   Recent Labs: Potassium (mmol/L)  Date Value  09/03/2018 2.9 (L)   Magnesium (mg/dL)  Date Value  09/03/2018 1.1 (L)   Calcium (mg/dL)  Date Value  09/03/2018 6.7 (L)   Albumin (g/dL)  Date Value  09/03/2018 2.0 (L)   Phosphorus (mg/dL)  Date Value  09/03/2018 1.5 (L)   Sodium (mmol/L)  Date Value  09/03/2018 139     Assessment: 53 year old female admitted with GI bleed, multiple organ failure. History of alcohol use PTA. Pharmacy consulted to replace electrolytes.  Goal of Therapy:  Electrolytes WNL  Plan:  Patient to receive potassium 10 mEq IV x 4 runs followed by potassium phosphate 20 mmol IV x 1. Patient has also received magnesium 4 g IV x 1.  Will order electrolytes with am labs.  Pharmacy to continue to follow and replace electrolytes as indicated.  Tawnya Crook ,PharmD Clinical Pharmacist 09/03/2018 1:34 PM

## 2018-09-03 NOTE — Progress Notes (Addendum)
Name: Mackenzie Little MRN: [id number] DOB: 1965/04/15     CONSULTATION DATE: 09/09/2018  CHIEF COMPLAINT:  GI bleed.   HISTORY OF PRESENT ILLNESS:  Briefly, this is a 53 year old female with history of HTN, alcohol abuse, portal HTN, and PUD being seen in the ICU fo further management of hypovolemic/septic shock secondary to acute GI bleed, likely alcoholic hepatitis, w/ concomitant AKI, and current evidence of DT. On 8/5, she presented to the ED complaining of melena x 2 weeks, with weakness and a single episode of N/V. Initial vital signs indicated hypoTN with SBP < 80. Initial labs with evidence of acute blood loss and elevated INR, elevated troponins, and evidence of AKI. She received LR, octreotide, vitamin K, and protonix in the ED, and was transferred to the ICU. In the ICU, a central line was placed and vasopressors were started to maintain MAP > 65. Received FFP and 2 units PRBC for coagulopathy and Hgb< 7.   SIGNIFICANT EVENTS: -  09/23/2018: ED visit c/o melena and weakness. Hgb low, with hypotension. Started on vit K, octreotide, Protonix and IVF (LR). Brought to ICU. Central line placed. Required vasopressors. GI consulted. - 08/31/2018: EGD postponed, due to critical condition, per GI. Paracentesis planned, not completed due to lack of ascites on abdominal US. RUQ [place] with evidence of cirrhotic liver changes, and contracted, thickened gallbladder wall possibly due to artifact from ascites/contraction, with small gallstones. Continued to require vasopressors. Weaned precedex as tolerated.   OVERNIGHT EVENTS: Continued weaning of Precedex, with increased dose overnight d/t agitation. Cefepime stopped early this AM. Urine output adequate. Remains critically ill  PAST MEDICAL HISTORY :   has a past medical history of Hypertension.  has a past surgical history that includes Tooth extraction (May 2016); Esophagogastroduodenoscopy (N/A, 07/07/2017); and Colonoscopy (N/A, 07/07/2017). Prior to  Admission medications   Medication Sig Start Date End Date Taking? Authorizing Provider  acetaminophen (TYLENOL) 325 MG tablet Take 650 mg by mouth every 6 (six) hours as needed.   Yes [provider]   No Known Allergies  REVIEW OF SYSTEMS:   Unable to obtain due to altered mental status.  VITAL SIGNS: Temp:  [98.6 F (37 C)-99.9 F (37.7 C)] 98.6 F (37 C) (08/07 0800) Pulse Rate:  [74-83] 80 (08/07 1000) Resp:  [15-24] 21 (08/07 1000) BP: (89-156)/(39-71) 98/40 (08/07 1000) SpO2:  [98 %-100 %] 98 % (08/07 1000)   I/O last 3 completed shifts: In: 7593.5 [I.V.:3522.1; Blood:1000; IV Piggyback:3071.4] Out: 3875 [Urine:3875] Total I/O In: 171.4 [I.V.:171.4] Out: 400 [Urine:400]   SpO2: 98 % O2 Flow Rate (L/min): 2 L/min  Physical Examination:  GENERAL: Ill appearing. In no acute respiratory distress, and appears to be supporting her airway. Mildly agitated, but not combative. HEAD: Normocephalic, atraumatic.  EYES: Pupils equal, round, constricted, and minimally reactive to light.  No scleral icterus.  MOUTH: Moist mucosal membrane. No upper teeth. NECK: Supple. No JVD.  PULMONARY: On 2L O2 via [place]. Clear to auscultation bilaterally. CARDIOVASCULAR: S1 and S2. Regular rate and rhythm. No murmurs, rubs, or gallops.  GASTROINTESTINAL: Abdomen distended, with more fullness in the epigastric region. No masses. Positive bowel sounds. No guarding. Unable to appreciate HSM given abdominal distention.  EXTREMITIES: Lower extremities: DP pulses diminished bilaterally (1+). No edema. Upper extremities: Radial pulses 1+ bilaterally. No edema. MUSCULOSKELETAL: No swelling, clubbing, or edema.  NEUROLOGIC: Obtunded. No reaction to verbal or painful stimuli. No spontaneous eye opening, or eye opening to pain/stimulation. No coherent speech.  SKIN:intact,warm,dry. Cold periphery. No skin tenting.  I personally reviewed lab work that was obtained in last 24 hrs.  MEDICATIONS:  I have reviewed all medications and confirmed regimen as documented CBC Latest Ref Rng & Units 09/03/2018 09/14/2018 09/13/2018  WBC 4.0 - 10.5 K/uL 8.4 - -  Hemoglobin 12.0 - 15.0 g/dL 6.3(L) 7.0(L) 7.4(L)  Hematocrit 36.0 - 46.0 % 18.5(L) 21.0(L) 22.0(L)  Platelets 150 - 400 K/uL 74(L) - -  dfd CMP Latest Ref Rng & Units 09/03/2018 09/10/2018 09/12/2018  Glucose 70 - 99 mg/dL 111(H) 127(H) 38(LL)  BUN 6 - 20 mg/dL 50(H) 62(H) 41(H)  Creatinine 0.44 - 1.00 mg/dL 1.22(H) 3.00(H) 2.47(H)  Sodium 135 - 145 mmol/L 139 134(L) 133(L)  Potassium 3.5 - 5.1 mmol/L 2.9(L) 3.9 3.9  Chloride 98 - 111 mmol/L 113(H) 102 93(L)  CO2 22 - 32 mmol/L 20(L) 18(L) 19(L)  Calcium 8.9 - 10.3 mg/dL 6.7(L) 6.6(L) 7.6(L)  Total Protein 6.5 - 8.1 g/dL 6.0(L) - 7.3  Total Bilirubin 0.3 - 1.2 mg/dL 10.6(H) - 8.6(H)  Alkaline Phos 38 - 126 U/L 49 - 80  AST 15 - 41 U/L 102(H) - 92(H)  ALT 0 - 44 U/L 28 - 28  HS Troponin: 608, 524, 512, 454 Magnesium: 1.1 Phosphorus: 1.5 Procalcitonin: 2.58 (2.71 08/29/2018) CULTURE RESULTS   Recent Results (from the past 240 hour(s))  Culture, blood (routine x 2)     Status: None (Preliminary result)   Collection Time: 09/23/2018  1:25 PM   Specimen: BLOOD  Result Value Ref Range Status   Specimen Description BLOOD LEFT ANTECUBITAL  Final   Special Requests   Final    BOTTLES DRAWN AEROBIC AND ANAEROBIC Blood Culture adequate volume   Culture   Final    NO GROWTH 2 DAYS Performed at [facility], [address] [place], [place] [address]    Report Status PENDING  Incomplete  Culture, blood (routine x 2)     Status: None (Preliminary result)   Collection Time: 09/02/2018  1:39 PM   Specimen: BLOOD  Result Value Ref Range Status   Specimen Description BLOOD RIGHT ANTECUBITAL  Final   Special Requests   Final    BOTTLES DRAWN AEROBIC AND ANAEROBIC Blood Culture results may not be optimal due to an excessive volume of blood received in culture bottles   Culture   Final    NO  GROWTH 2 DAYS Performed at [facility], [address] [place], [place] [address]    Report Status PENDING  Incomplete  SARS Coronavirus 2 [facility] order, Performed in [place] hospital lab)     Status: None   Collection Time: 09/09/2018  4:22 PM  Result Value Ref Range Status   SARS Coronavirus 2 NEGATIVE NEGATIVE Final    Comment: (NOTE) SARS CoV 2 target nucleic acids are NOT DETECTED. The SARS CoV 2 RNA is generally detectable in upper and lower respiratory specimens during the acute phase of infection. The lowest concentration of SARS CoV 2 viral copies this assay can detect is 250 copies per mL. A negative result does not preclude SARS CoV 2 infection and should not be used as the sole basis for treatment or other patient management decisions. A negative result may occur with improper specimen collection and handling, submission  of specimen other than nasopharyngeal swab, presence of viral mutation(s) within the areas targeted by this assay, and inadequate number of viral copies (less than 250 copies per mL). A negative result must be  combined with clinical observations, patient history,  and epidemiological information. The expected result is Negative. Fact Sheet for Patients:   https [employer] media 010932 download Fact Sheet for Healthcare Providers:   https [employer] media (640)341-9678 d Jenel Lucks This test is not yet approved or cleared by the [place] FDA and  has been authorized for detection and/or diagnosis of SARS CoV 2 by FDA under an Emergency Use Authorization (EUA).  This EUA will remain in effect (meaning this test can be used) for the duration of  the COVID19 declaration under Section 564(b)(1) of the Act, 21 U.S.C.  section 360bbb-3(b)(1), unless the authorization is terminated or revoked sooner. Performed at [facility], [address] [place], [place] [address]   MRSA PCR Screening     Status: None   Collection Time: 09/07/2018   8:38 PM   Specimen: Nasal Mucosa; Nasopharyngeal  Result Value Ref Range Status   MRSA by PCR NEGATIVE NEGATIVE Final    Comment:        The GeneXpert MRSA Assay (FDA approved for NASAL specimens only), is one component of a comprehensive MRSA colonization surveillance program. It is not intended to diagnose MRSA infection nor to guide or monitor treatment for MRSA infections. Performed at [facility], [address] [place], [place] [address]       IMAGING    US Abdomen Limited  Result Date: 08/28/2018 CLINICAL DATA:  54 year old with history of cirrhosis. Evaluate for ascites. EXAM: LIMITED ABDOMEN ULTRASOUND FOR ASCITES TECHNIQUE: Limited ultrasound survey for ascites was performed in all four abdominal quadrants. COMPARISON:  MRI 07/06/2017 FINDINGS: No ascites identified. Fluid in the urinary bladder. Evidence for fluid within bowel structures. IMPRESSION: Negative for ascites. Electronically Signed   By: Markus Daft M.D.   On: 09/26/2018 12:01   US Abdomen Limited Ruq  Result Date: 09/26/2018 CLINICAL DATA:  Cirrhosis EXAM: ULTRASOUND ABDOMEN LIMITED RIGHT UPPER QUADRANT COMPARISON:  07/06/2017 FINDINGS: Gallbladder: Contracted gallbladder with thickened wall and mild pericholecystic fluid. Small echogenic within the lumen likely representing tiny calculi. Common bile duct: Diameter: 4 mm, normal Liver: Heterogeneous increased echogenicity of the liver with minimally nodular hepatic contour suggesting cirrhosis. No focal hepatic mass lesion. Hepatic veins mildly distended suggesting elevated RIGHT heart pressure. Portal vein is patent on color Doppler imaging with normal direction of blood flow towards the liver. Other: Small amount of perihepatic ascites as well as pericholecystic fluid. IMPRESSION: Contracted gallbladder with thickened gallbladder wall which may be artifactual in the setting of contraction and ascites. Probable small gallstones. Cirrhotic appearing liver  without definite mass. Electronically Signed   By: Lavonia Dana M.D.   On: 09/27/2018 13:50   Indwelling Urinary Catheter continued, requirement due to   Reason to continue Indwelling Urinary Catheter strict Intake/Output monitoring for hemodynamic instability   Central Line/ continued, requirement due to  Reason to continue [address] of central venous pressure or other hemodynamic parameters and poor IV access    ASSESSMENT AND PLAN SYNOPSIS: 53 year old female with history as state above seen for managemetn of septic/hypovolemic shock with concomitant AKI, secondary to acute GI bleed, with evidence of DT and cirrhotic changes c/w patient's history of alcohol use disorder. Evidence of multi-organ failure. Prognosis still guarded, however she is showing improvement in her kidney function. Still requiring pressors to maintain perfusion.  SHOCK-SEPSIS/HYPOVOLUMIC - use vasopressors (levophed) to keep MAP>65 - Will start IV bolus of NS to try and wean patient off of pressors.  -  follow ABG and LA as indicated. - follow up cultures - no growth at 2 days. Hepatitis and HIV panels completed and both negative. - Continue empiric ABX with cefepime/flagyl  - Paracentesis with culture/cytology not performed due to lack of ascitic fluid found on abdominal US. - consider stress dose steroids - Continue IV fluid resuscitation  ACUTE KIDNEY INJURY/Renal Failure - slightly improving, with adequate urine output -follow chem 7 -follow UO -continue Foley Catheter-assess need -Avoid nephrotoxic agents  Anemia secondary to GI bleed, with thrombocytopenia - 1 unit PRBC ordered for Hgb of 6.3. Will continue to monitor, and transfuse if Hgb < 7.  - FFP if worsening coagulopathy on coag labs. - Platelets today measured at 74,000. 144,000 yesterday.  Will continue to monitor.  - GI following. No plans for EGD at present given patient's condition.  - Continue Octreotide and protonix.  NEUROLOGY  - DT secondary to chronic alcohol use. - Continue precedex. Wean as tolerated. - Ativan PRN for agitation. - Continue CIWA protocol  CARDIAC - Cardiology signed off. No longer trending troponins. - ICU monitoring.  ELECTROLYTES - Hypokalemia at 2.9 this AM, continue supplementation with IV KCl, as indicated. - Hypomagnesemia and hypophosphotemia this AM as well. Continue supplementation PRN. - Will continue to monitor Calcium levels. Supplementation as indicated. - follow labs as needed -replace as needed -pharmacy consultation and following  ENDO - Hypoglycemia resolved. - will use ICU hypoglycemic\Hyperglycemia protocol if needed  ID -continue IV abx as prescibed -follow up cultures  GI GI PROPHYLAXIS as indicated  NUTRITIONAL STATUS DIET--> NPO Constipation protocol as indicated  DVT/GI PRX ordered - SCDs TRANSFUSIONS AS NEEDED MONITOR FSBS ASSESS the need for LABS   Critical Care Time devoted to patient care services described in this note is 35 minutes.   Overall, patient is critically ill, prognosis is guarded.  Patient with Multiorgan failure and at high risk for cardiac arrest and death.   Husband updated and notified  Luciel Brickman Patricia Pesa, M.D.  [facility]  Medical Director [place] Director [facility] Cardio-Pulmonary Department

## 2018-09-03 NOTE — Progress Notes (Signed)
clinical status relayed to family-Husband  Updated and notified of patients medical condition-  Progressive multiorgan failure    Family understands the situation.   Family are satisfied with Plan of action and management. All questions answered  Corrin Parker, M.D.  [facility]  Medical Director [place] Director [facility] Cardio-Pulmonary Department

## 2018-09-03 NOTE — Progress Notes (Signed)
[facility] [place] NAME: Mackenzie Little    MR#:  [id number]  DATE OF BIRTH:  January 17, 1966  SUBJECTIVE:  CHIEF COMPLAINT:   Chief Complaint  Patient presents with  . Constipation  . Chest Pain  Hb 6.3, remains pale and critically sick REVIEW OF SYSTEMS:  ROS unable to obtain due to critical sickness and mental status DRUG ALLERGIES:  No Known Allergies VITALS:  Blood pressure 98/63, pulse 71, temperature 98.7 F (37.1 C), temperature source Axillary, resp. rate 19, height 5\' 6"  (1.676 m), weight 75.4 kg, SpO2 100 %. PHYSICAL EXAMINATION:  Physical Exam Constitutional:      Appearance: She is ill-appearing and toxic-appearing.  HENT:     Head: Normocephalic and atraumatic.  Eyes:     Conjunctiva/sclera: Conjunctivae normal.     Pupils: Pupils are equal, round, and reactive to light.  Neck:     Musculoskeletal: Normal range of motion and neck supple.     Thyroid: No thyromegaly.     Trachea: No tracheal deviation.  Cardiovascular:     Rate and Rhythm: Normal rate and regular rhythm.     Heart sounds: Normal heart sounds.  Pulmonary:     Effort: Pulmonary effort is normal. No respiratory distress.     Breath sounds: Decreased breath sounds present. No wheezing.  Chest:     Chest wall: No tenderness.  Abdominal:     General: Bowel sounds are decreased. There is no distension.     Palpations: There is fluid wave.     Tenderness: There is generalized abdominal tenderness.  Musculoskeletal: Normal range of motion.  Skin:    General: Skin is warm and dry.     Findings: No rash.  Neurological:     Mental Status: She is lethargic.     Cranial Nerves: No cranial nerve deficit.  Psychiatric:        Behavior: Behavior is uncooperative.     Comments: Unable to evaluate due to critical sickness and her mental status    LABORATORY PANEL:  Female CBC Recent Labs  Lab 09/03/18 0500  WBC 8.4  HGB 6.3*  HCT 18.5*  PLT  74*   ------------------------------------------------------------------------------------------------------------------ Chemistries  Recent Labs  Lab 09/03/18 0500  NA 139  K 2.9*  CL 113*  CO2 20*  GLUCOSE 111*  BUN 50*  CREATININE 1.22*  CALCIUM 6.7*  MG 1.1*  AST 102*  ALT 28  ALKPHOS 49  BILITOT 10.6*   RADIOLOGY:  No results found. ASSESSMENT AND PLAN:  53 year old female patient with a known history of hypertension, alcohol abuse presented to the emergency room for generalized weakness.  Patient has been fatigued for the last 3 days and noticed dark tarry stool for the last couple of days.   *Hypovolemic shock and multiorgan failure -due to active GI bleed - Hgb 6.3 today, will get 1 more PRBC - on vasopressors to maintain MAP >65. Status post 2U PRBC and FFP  -Acute gastrointestinal bleeding -on octreotide and Protonix drip - melena x 2 weeks - GI is following the patient - No plans for invasive luminal evaluation at this time 2/2 patient's critical condition in the setting of no acute GI hemorrhage - Continue Octreotide and Protonix - Continue to monitor H&H. Transfuse for Hgb <7.0. - Consider additional FFP is worsening coags  - If acute GI bleeding is present, will need urgent EGD. GI will continue to follow along.   -  Acute symptomatic severe anemia - status post transfuse 2 units PRBC and 1 more today as Hb 6.3  -Acute kidney injury: Likely due to ATN due to hypotension Creat improving - 1.2  -Elevated lactic acid -likely due to multiorgan failure and hypotension/dehydration Follow-up levels Empiric broad spectrum antibiotics  -Elevated troponins  -Likely from demand ischemia   Patient looks critically sick with multiorgan failure and active GI bleed  All the records are reviewed and case discussed with Care Management/Social Worker. Management plans discussed with the patient, intensivist and they are in agreement.  CODE STATUS: Full Code   TOTAL TIME TAKING CARE OF THIS PATIENT: 15 minutes.   More than 50% of the time was spent in counseling/coordination of care: Valaria Good M.D on 09/03/2018 at 3:18 PM  Between 7am to 6pm - Pager - [phone]  After 6pm go to [email or web address] - password [email or web address] [facility]  [facility] [place]  Office  [phone]  CC: Primary care physician; Ranae Plumber, [place]  Note: This dictation was prepared with Dragon dictation along with smaller phrase technology. Any transcriptional errors that result from this process are unintentional.

## 2018-09-03 NOTE — Progress Notes (Signed)
GI Inpatient Follow-up Note  Subjective:  Patient seen and examined this afternoon. No acute events overnight. Daughter is at bedside. EGD was cancelled yesterday due to critical condition. Abd US showed no abdominal ascites so paracentesis was not performed. She has continued to require vasopressors. Precedex has been weaned as tolerated. She has received 1 unit of pRBCs. Plt decreased to 74 (144 yesterday). Per nursing, one tarry BM last night. No signs of hematemesis, hematochezia, or other gross GI bleeding. Kidney function is improving with mild improvements in BUN and creatinine. Daughter at bedside has multiple questions about patient's condition and prognosis.   Scheduled Inpatient Medications:  . Chlorhexidine Gluconate Cloth  6 each Topical QHS  . pantoprazole (PROTONIX) IV  40 mg Intravenous Once  . [START ON 09/05/2018] pantoprazole  40 mg Intravenous Q12H  . pentoxifylline  400 mg Oral TID WC  . sodium chloride flush  10-40 mL Intracatheter Q12H    Continuous Inpatient Infusions:   . sodium chloride Stopped (09/11/2018 1130)  . ceFEPime (MAXIPIME) IV    . dexmedetomidine (PRECEDEX) IV infusion 1 mcg/kg/hr (09/03/18 1155)  . metronidazole Stopped (09/03/18 1103)  . norepinephrine (LEVOPHED) Adult infusion 9 mcg/min (09/03/18 1155)  . octreotide  (SANDOSTATIN)    IV infusion 50 mcg/hr (09/03/18 1155)  . pantoprozole (PROTONIX) infusion 8 mg/hr (09/03/18 1155)  . potassium PHOSPHATE IVPB (in mmol) 20 mmol (09/03/18 1413)    PRN Inpatient Medications:  sodium chloride, acetaminophen **OR** acetaminophen, LORazepam, ondansetron **OR** ondansetron (ZOFRAN) IV, sodium chloride flush, traMADol  Review of Systems:  Unable to obtain due to patient's mental status    Physical Examination: BP (!) 96/50   Pulse 70   Temp 98.9 F (37.2 C) (Axillary)   Resp 20   Ht 5\' 6"  (1.676 m)   Wt 75.4 kg   SpO2 100%   BMI 26.83 kg/m  Critically ill appearing female. Obtunded.  HEENT:  normocephalic, atraumatic  Neck: supple, no JVD or thyromegaly Chest: CTA bilaterally, no wheezes, crackles, or other adventitious sounds CV: RRR, no m/g/c/r Abd: mildly distended, hypoactive bowel sounds, no tenderness to palpation, palpable liver in epigastric region Ext: no edema, well perfused with 2+ pulses, Skin: no rash or lesions noted Lymph: no LAD  Data: Lab Results  Component Value Date   WBC 8.4 09/03/2018   HGB 6.3 (L) 09/03/2018   HCT 18.5 (L) 09/03/2018   MCV 81.5 09/03/2018   PLT 74 (L) 09/03/2018   Recent Labs  Lab 08/28/2018 0827 09/13/2018 1444 09/03/18 0500  HGB 7.4* 7.0* 6.3*   Lab Results  Component Value Date   NA 139 09/03/2018   K 2.9 (L) 09/03/2018   CL 113 (H) 09/03/2018   CO2 20 (L) 09/03/2018   BUN 50 (H) 09/03/2018   CREATININE 1.22 (H) 09/03/2018   Lab Results  Component Value Date   ALT 28 09/03/2018   AST 102 (H) 09/03/2018   ALKPHOS 49 09/03/2018   BILITOT 10.6 (H) 09/03/2018   Recent Labs  Lab 09/17/2018 0827  INR 1.8*   Assessment/Plan:  53 y/o AA female admitted for generalized weakness, melena x 2 weeks  1. Alcoholic cirrhosis with portal hypertensive gastropathy  2. Acute GI bleeding - melena x 2 weeks 3. Alcoholic hepatitis - Maddrey score 63.3 (calculated with INR 2.3, Bilirubin 8.6) 4. Hypovolemic shock - continue care per intensivist. Empiric abx with cefepime/flagyl 5. Acute kidney injury, renal failure - foley in place 6. Thrombocytopenia - Plt today 74 (144 yesterday)  -  No plans for invasive luminal evaluation at this time 2/2 patient's critical condition in the setting of no acute GI hemorrhage - Continue Octreotide and Protonix - Continue to monitor H&H. Transfuse for Hgb <7.0. - Consider additional FFP is worsening coags  - If acute GI bleeding is present, will need urgent EGD. GI will continue to follow along.  - Continue to monitor platelets and coags - Continue care per intensivist   Please call with  questions or concerns.    Octavia Bruckner, PA-C [facility] [phone] (Cell)

## 2018-09-04 ENCOUNTER — Inpatient Hospital Stay: Payer: Medicaid Other

## 2018-09-04 DIAGNOSIS — F10231 Alcohol dependence with withdrawal delirium: Secondary | ICD-10-CM

## 2018-09-04 DIAGNOSIS — J9601 Acute respiratory failure with hypoxia: Secondary | ICD-10-CM

## 2018-09-04 LAB — BASIC METABOLIC PANEL
Anion gap: 8 (ref 5–15)
BUN: 28 mg/dL — ABNORMAL HIGH (ref 6–20)
CO2: 22 mmol/L (ref 22–32)
Calcium: 6.8 mg/dL — ABNORMAL LOW (ref 8.9–10.3)
Chloride: 112 mmol/L — ABNORMAL HIGH (ref 98–111)
Creatinine, Ser: 0.62 mg/dL (ref 0.44–1.00)
GFR calc Af Amer: >60 mL/min (ref >60)
GFR calc non Af Amer: >60 mL/min (ref >60)
Glucose, Bld: 125 mg/dL — ABNORMAL HIGH (ref 70–99)
Potassium: 2.8 mmol/L — ABNORMAL LOW (ref 3.5–5.1)
Sodium: 142 mmol/L (ref 135–145)

## 2018-09-04 LAB — PHOSPHORUS: Phosphorus: 2.1 mg/dL — ABNORMAL LOW (ref 2.5–4.6)

## 2018-09-04 LAB — CBC
HCT: 24 % — ABNORMAL LOW (ref 36.0–46.0)
Hemoglobin: 8.1 g/dL — ABNORMAL LOW (ref 12.0–15.0)
MCH: 27.1 pg (ref 26.0–34.0)
MCHC: 33.8 g/dL (ref 30.0–36.0)
MCV: 80.3 fL (ref 80.0–100.0)
Platelets: 73 10*3/uL — ABNORMAL LOW (ref 150–400)
RBC: 2.99 MIL/uL — ABNORMAL LOW (ref 3.87–5.11)
RDW: 16.8 % — ABNORMAL HIGH (ref 11.5–15.5)
WBC: 10.6 10*3/uL — ABNORMAL HIGH (ref 4.0–10.5)
nRBC: 1.2 % — ABNORMAL HIGH (ref 0.0–0.2)

## 2018-09-04 LAB — BLOOD GAS, ARTERIAL
Acid-base deficit: 8.3 mmol/L — ABNORMAL HIGH (ref 0.0–2.0)
Bicarbonate: 18.9 mmol/L — ABNORMAL LOW (ref 20.0–28.0)
FIO2: 1
MECHVT: 500 mL
O2 Saturation: 96.2 %
PEEP: 5 cmH2O
Patient temperature: 37
RATE: 16 resp/min
pCO2 arterial: 44 mmHg (ref 32.0–48.0)
pH, Arterial: 7.24 — ABNORMAL LOW (ref 7.350–7.450)
pO2, Arterial: 97 mmHg (ref 83.0–108.0)

## 2018-09-04 LAB — PROTIME-INR
INR: 1.7 — ABNORMAL HIGH (ref 0.8–1.2)
Prothrombin Time: 19.4 seconds — ABNORMAL HIGH (ref 11.4–15.2)

## 2018-09-04 LAB — MAGNESIUM
Magnesium: 1.8 mg/dL (ref 1.7–2.4)
Magnesium: 1.8 mg/dL (ref 1.7–2.4)

## 2018-09-04 LAB — GLUCOSE, CAPILLARY
Glucose-Capillary: 123 mg/dL — ABNORMAL HIGH (ref 70–99)
Glucose-Capillary: 103 mg/dL — ABNORMAL HIGH (ref 70–99)
Glucose-Capillary: 120 mg/dL — ABNORMAL HIGH (ref 70–99)
Glucose-Capillary: 68 mg/dL — ABNORMAL LOW (ref 70–99)
Glucose-Capillary: 97 mg/dL (ref 70–99)
Glucose-Capillary: 129 mg/dL — ABNORMAL HIGH (ref 70–99)
Glucose-Capillary: 68 mg/dL — ABNORMAL LOW (ref 70–99)
Glucose-Capillary: 62 mg/dL — ABNORMAL LOW (ref 70–99)
Glucose-Capillary: 97 mg/dL (ref 70–99)

## 2018-09-04 LAB — POTASSIUM
Potassium: 2.4 mmol/L — CL (ref 3.5–5.1)
Potassium: 4.6 mmol/L (ref 3.5–5.1)

## 2018-09-04 LAB — PREPARE RBC (CROSSMATCH)

## 2018-09-04 MED ORDER — FENTANYL CITRATE (PF) 100 MCG/2ML IJ SOLN
INTRAMUSCULAR | Status: AC
  Administered 2018-09-04: 11:00:00 100 ug via INTRAVENOUS
  Filled 2018-09-04: qty 2

## 2018-09-04 MED ORDER — POTASSIUM PHOSPHATES 15 MMOLE/5ML IV SOLN
15.0000 mmol | Freq: Once | INTRAVENOUS | Status: AC
  Administered 2018-09-04: 15 mmol via INTRAVENOUS
  Filled 2018-09-04: qty 5

## 2018-09-04 MED ORDER — VECURONIUM BROMIDE 10 MG IV SOLR
10.0000 mg | Freq: Once | INTRAVENOUS | Status: AC
  Administered 2018-09-04: 11:00:00 10 mg via INTRAVENOUS

## 2018-09-04 MED ORDER — ORAL CARE MOUTH RINSE
15.0000 mL | Freq: Two times a day (BID) | OROMUCOSAL | Status: DC
  Administered 2018-09-04: 15 mL via OROMUCOSAL

## 2018-09-04 MED ORDER — MAGNESIUM SULFATE IN D5W 1-5 GM/100ML-% IV SOLN
1.0000 g | Freq: Once | INTRAVENOUS | Status: AC
  Administered 2018-09-04: 1 g via INTRAVENOUS
  Filled 2018-09-04: qty 100

## 2018-09-04 MED ORDER — MIDAZOLAM HCL 2 MG/2ML IJ SOLN
INTRAMUSCULAR | Status: AC
  Administered 2018-09-04: 4 mg via INTRAVENOUS
  Filled 2018-09-04: qty 4

## 2018-09-04 MED ORDER — THIAMINE HCL 100 MG/ML IJ SOLN
100.0000 mg | Freq: Every day | INTRAMUSCULAR | Status: DC
  Administered 2018-09-04 – 2018-09-07 (×4): 100 mg via INTRAVENOUS
  Filled 2018-09-04 (×4): qty 2

## 2018-09-04 MED ORDER — DEXTROSE 50 % IV SOLN
12.5000 g | INTRAVENOUS | Status: AC
  Administered 2018-09-04: 12.5 g via INTRAVENOUS

## 2018-09-04 MED ORDER — MIDAZOLAM HCL 2 MG/2ML IJ SOLN
4.0000 mg | Freq: Once | INTRAMUSCULAR | Status: AC
  Administered 2018-09-04: 11:00:00 4 mg via INTRAVENOUS

## 2018-09-04 MED ORDER — POTASSIUM CHLORIDE 10 MEQ/50ML IV SOLN
10.0000 meq | INTRAVENOUS | Status: AC
  Administered 2018-09-04 (×4): 10 meq via INTRAVENOUS
  Filled 2018-09-04 (×4): qty 50

## 2018-09-04 MED ORDER — VECURONIUM BROMIDE 10 MG IV SOLR
INTRAVENOUS | Status: AC
  Administered 2018-09-04: 11:00:00 10 mg via INTRAVENOUS
  Filled 2018-09-04: qty 10

## 2018-09-04 MED ORDER — DEXTROSE 50 % IV SOLN
INTRAVENOUS | Status: AC
  Filled 2018-09-04: qty 50

## 2018-09-04 MED ORDER — SODIUM CHLORIDE 0.9 % IV SOLN
2.0000 g | Freq: Three times a day (TID) | INTRAVENOUS | Status: DC
  Administered 2018-09-04 – 2018-09-05 (×4): 2 g via INTRAVENOUS
  Filled 2018-09-04 (×5): qty 2

## 2018-09-04 MED ORDER — DEXTROSE 50 % IV SOLN
INTRAVENOUS | Status: AC
  Administered 2018-09-04: 12.5 g via INTRAVENOUS
  Filled 2018-09-04: qty 50

## 2018-09-04 MED ORDER — POTASSIUM CHLORIDE 10 MEQ/50ML IV SOLN
10.0000 meq | INTRAVENOUS | Status: AC
  Administered 2018-09-04 (×6): 10 meq via INTRAVENOUS
  Filled 2018-09-04 (×6): qty 50

## 2018-09-04 MED ORDER — ORAL CARE MOUTH RINSE
15.0000 mL | OROMUCOSAL | Status: DC
  Administered 2018-09-04 – 2018-09-18 (×139): 15 mL via OROMUCOSAL

## 2018-09-04 MED ORDER — CHLORHEXIDINE GLUCONATE 0.12% ORAL RINSE (MEDLINE KIT)
15.0000 mL | Freq: Two times a day (BID) | OROMUCOSAL | Status: DC
  Administered 2018-09-04 – 2018-09-18 (×28): 15 mL via OROMUCOSAL

## 2018-09-04 MED ORDER — FENTANYL 2500MCG IN NS 250ML (10MCG/ML) PREMIX INFUSION
0.0000 ug/h | INTRAVENOUS | Status: DC
  Administered 2018-09-04: 50 ug/h via INTRAVENOUS
  Administered 2018-09-05: 125 ug/h via INTRAVENOUS
  Administered 2018-09-05: 200 ug/h via INTRAVENOUS
  Administered 2018-09-06: 100 ug/h via INTRAVENOUS
  Administered 2018-09-07 – 2018-09-08 (×3): 225 ug/h via INTRAVENOUS
  Administered 2018-09-08: 200 ug/h via INTRAVENOUS
  Administered 2018-09-09: 250 ug/h via INTRAVENOUS
  Filled 2018-09-04 (×9): qty 250

## 2018-09-04 MED ORDER — FENTANYL CITRATE (PF) 100 MCG/2ML IJ SOLN
100.0000 ug | Freq: Once | INTRAMUSCULAR | Status: AC
  Administered 2018-09-04: 100 ug via INTRAVENOUS

## 2018-09-04 MED ORDER — LORAZEPAM 2 MG/ML IJ SOLN
2.0000 mg | INTRAMUSCULAR | Status: DC | PRN
  Administered 2018-09-05 – 2018-09-06 (×2): 3 mg via INTRAVENOUS
  Administered 2018-09-06: 2 mg via INTRAVENOUS
  Filled 2018-09-04: qty 1
  Filled 2018-09-04 (×2): qty 2

## 2018-09-04 MED ORDER — STERILE WATER FOR INJECTION IJ SOLN
INTRAMUSCULAR | Status: AC
  Administered 2018-09-04: 10 mL
  Filled 2018-09-04: qty 10

## 2018-09-04 MED ORDER — VASOPRESSIN 20 UNIT/ML IV SOLN
0.0300 [IU]/min | INTRAVENOUS | Status: DC
  Administered 2018-09-04 – 2018-09-06 (×3): 0.03 [IU]/min via INTRAVENOUS
  Filled 2018-09-04 (×4): qty 2

## 2018-09-04 MED ORDER — DEXTROSE 50 % IV SOLN
12.5000 g | Freq: Once | INTRAVENOUS | Status: AC
  Administered 2018-09-04: 20:00:00 12.5 g via INTRAVENOUS

## 2018-09-04 MED ORDER — FOLIC ACID 5 MG/ML IJ SOLN
1.0000 mg | Freq: Every day | INTRAMUSCULAR | Status: DC
  Administered 2018-09-04 – 2018-09-12 (×9): 1 mg via INTRAVENOUS
  Filled 2018-09-04 (×10): qty 0.2

## 2018-09-04 MED ORDER — DEXTROSE 50 % IV SOLN
12.5000 g | INTRAVENOUS | Status: AC
  Administered 2018-09-04: 16:00:00 12.5 g via INTRAVENOUS
  Filled 2018-09-04: qty 50

## 2018-09-04 NOTE — Progress Notes (Signed)
PHARMACY CONSULT NOTE - FOLLOW UP  Pharmacy Consult for Electrolyte Monitoring and Replacement   Recent Labs: Potassium (mmol/L)  Date Value  09/04/2018 2.4 (LL)   Magnesium (mg/dL)  Date Value  09/04/2018 1.8   Calcium (mg/dL)  Date Value  09/04/2018 6.8 (L)   Albumin (g/dL)  Date Value  09/03/2018 2.0 (L)   Phosphorus (mg/dL)  Date Value  09/04/2018 2.1 (L)   Sodium (mmol/L)  Date Value  09/04/2018 142     Assessment: 53 year old female admitted with GI bleed, multiple organ failure. History of alcohol use PTA. Pharmacy consulted to replace electrolytes.  Goal of Therapy:  Electrolytes WNL   Plan:  8/8 14:00 K 2.4, will replace with KCl 38mEq IV times 6.  Will recheck K and Mag @ 2200, F/U all electrolytes with AM labs.   Pharmacy to continue to follow and replace electrolytes as indicated.  Paulina Fusi, PharmD, BCPS 09/04/2018 2:53 PM

## 2018-09-04 NOTE — Progress Notes (Signed)
[place] [place] NAME: Mackenzie Little    MR#:  [id number]  DATE OF BIRTH:  [birth year]  SUBJECTIVE:  CHIEF COMPLAINT:   Chief Complaint  Patient presents with  . Constipation  . Chest Pain  Patient seen and evaluated today Has been intubated put on ventilator by ICU attending Currently on IV pressor medication to support blood pressure On mechanical ventilator  REVIEW OF SYSTEMS:    ROS  On mechanical ventilator  DRUG ALLERGIES:  No Known Allergies  VITALS:  Blood pressure (!) 105/52, pulse 92, temperature 98.7 F (37.1 C), temperature source Axillary, resp. rate 18, height 5\' 6"  (1.676 m), weight 75.4 kg, SpO2 100 %.  PHYSICAL EXAMINATION:   Physical Exam  GENERAL:  53 y.o.-year-old patient lying in the bed on ventilator EYES: Pupils equal, round, reactive to light and accommodation. No scleral icterus. Extraocular muscles intact.  HEENT: Head atraumatic, normocephalic. Oropharynx endotracheal tube noted NECK:  Supple, no jugular venous distention. No thyroid enlargement, no tenderness.  LUNGS: Improved breath sounds bilaterally, scattered rales in both lungs.  On ventilator CARDIOVASCULAR: S1, S2 normal. No murmurs, rubs, or gallops.  ABDOMEN: Soft, nontender, nondistended. Bowel sounds present. No organomegaly or mass.  EXTREMITIES: No cyanosis, clubbing or edema b/l.    NEUROLOGIC: Complete nervous system exam was not possible as patient is on ventilator PSYCHIATRIC: The patient is alert and oriented x none.  SKIN: No obvious rash, lesion, or ulcer.   LABORATORY PANEL:   CBC Recent Labs  Lab 09/04/18 0400  WBC 10.6*  HGB 8.1*  HCT 24.0*  PLT 73*   ------------------------------------------------------------------------------------------------------------------ Chemistries  Recent Labs  Lab 09/03/18 0500 09/04/18 0400  NA 139 142  K 2.9* 2.8*  CL 113* 112*  CO2 20* 22  GLUCOSE 111* 125*  BUN 50* 28*   CREATININE 1.22* 0.62  CALCIUM 6.7* 6.8*  MG 1.1* 1.8  AST 102*  --   ALT 28  --   ALKPHOS 49  --   BILITOT 10.6*  --    ------------------------------------------------------------------------------------------------------------------  Cardiac Enzymes No results for input(s): TROPONINI in the last 168 hours. ------------------------------------------------------------------------------------------------------------------  RADIOLOGY:  US Pelvis (transabdominal Only)  Result Date: 09/04/2018 CLINICAL DATA:  Vaginal bleeding EXAM: TRANSABDOMINAL ULTRASOUND OF PELVIS TECHNIQUE: Transabdominal ultrasound examination of the pelvis was performed including evaluation of the uterus, ovaries, adnexal regions, and pelvic cul-de-sac. COMPARISON:  12/27/2008 FINDINGS: Uterus Measurements: 8.1 x 4.3 x 5.9 cm = volume: 109 mL. Left uterine body/fundal fibroid of 3.5 x 3.1 x 3.4 cm. Endometrium Thickness: Normal, 3 mm.  No focal abnormality visualized. Right ovary Obscured by bowel gas. Left ovary Measurements: 2.7 x 1.8 x 2.6 cm = volume: 6.7 mL. Normal appearance/no adnexal mass. Other findings:  Trace free pelvic fluid is likely physiologic. IMPRESSION: 1. Normal appearance of the endometrium. 2. Uterine fundal/body fibroid. 3. Trace free fluid. Electronically Signed   By: Abigail Miyamoto M.D.   On: 09/04/2018 13:01   Dg Chest Port 1 View  Result Date: 09/04/2018 CLINICAL DATA:  Possible aspiration.  Intubation. EXAM: PORTABLE CHEST 1 VIEW COMPARISON:  July 06, 2017 FINDINGS: The ETT terminates 2.2 cm above the carina in good position. No pneumothorax. Bibasilar pulmonary infiltrates, right greater than left. The cardiomediastinal silhouette is stable. IMPRESSION: 1. The ETT is in good position. 2. Bilateral pulmonary infiltrates in the bases, right much greater than left may represent aspiration versus multifocal pneumonia. Recommend clinical correlation and follow-up to resolution. Electronically Signed  By: Dorise Bullion III M.D   On: 09/04/2018 12:58     ASSESSMENT AND PLAN:   53 year old female patientwith a known history ofhypertension, alcohol abuse presented to the emergency room for generalized weakness.Patient has been fatigued for the last 3 days and noticed dark tarry stool for the last couple of days.   Patient was tired and fatigued and had labored breathing this morning and has been electively intubated and put on ventilator by intensivist.  *Hypovolemic shock and multiorgan failure -due to active GI bleed -  Initial labs with evidence of acute blood loss (Hgb 6.5), and elevated INR, elevated troponins, and evidence of AKI (Cr 2.47, GFR 25) - on vasopressors to maintain MAP >65. Status post 2U PRBC and FFP last night.   -Acute respiratory failure with hypoxia Mechanical ventilator to continue Vent bundle Intensivist follow-up  -Acute gastrointestinal bleeding -on octreotide and Protonix drip - melena x 2 weeks, received LR, octreotide, vitamin K, and protonix  - GI is following the patient, and was planning on performing an EGD this AM. However, given her critically ill condition, and evidence of multi-organ failure, they have decided to post-pone the procedure and reassess tomorrow. Plan for paracentesis today, pending INR results  -Acute symptomatic severe anemia - status post transfuse 2 units PRBC   -Acute kidney injury: Likely due to ATN due to hypotension Avoid nephrotoxic medications IV fluids and monitor renal function  -Elevated lactic acid -likely due to multiorgan failure and hypotension/dehydration Follow-up levels Empiric broad spectrum antibiotics  -Elevated troponins  -Likely from demand ischemia  -Acute hypokalemia Potassium to be replaced aggressively  -Long-term prognosis poor  All the records are reviewed and case discussed with Care Management/Social Worker. Management plans discussed with the patient, family and they are in  agreement.  CODE STATUS: Full code  DVT Prophylaxis: SCDs  TOTAL TIME TAKING CARE OF THIS PATIENT: 35 minutes.   POSSIBLE D/C IN 4 to 5 DAYS, DEPENDING ON CLINICAL CONDITION.  Saundra Shelling M.D on 09/04/2018 at 2:13 PM  Between 7am to 6pm - Pager - [phone]  After 6pm go to [email or web address] - password [email or web address] [facility]  Office  [phone]  CC: Primary care physician; Ranae Plumber, [place]  Note: This dictation was prepared with Dragon dictation along with smaller phrase technology. Any transcriptional errors that result from this process are unintentional.

## 2018-09-04 NOTE — Consult Note (Signed)
Pharmacy Antibiotic Note  Mackenzie Little is a 53 y.o. female admitted on 09/25/2018 with sepsis.  Pharmacy has been consulted for cefepime and vancomycin dosing.  Vancmycin d/c 09/25/2018. Being intubated 8/8  Plan: Day 4- Scr improved 1.22>0.62. Will adjust to Cefepime 2 g q8h    Height: 5\' 6"  (167.6 cm) Weight: 166 lb 3.6 oz (75.4 kg) IBW/kg (Calculated) : 59.3Will give vancomycin 1750 mg x 1 dose and order a 24 hour level. Renal function is not stable, will dose by levels.   Temp (24hrs), Avg:98.7 F (37.1 C), Min:98.6 F (37 C), Max:98.9 F (37.2 C)  Recent Labs  Lab 09/17/2018 1315  09/15/2018 2116 09/05/2018 2328 09/12/2018 0130 09/01/2018 0455 09/19/2018 0827 09/03/18 0500 09/04/18 0400  WBC 10.6*  --   --   --   --  20.1*  --  8.4 10.6*  CREATININE 2.47*  --   --   --   --  3.00*  --  1.22* 0.62  LATICACIDVEN 7.9*   < > 8.5* 8.2* 6.6* 3.7* 2.0*  --   --    < > = values in this interval not displayed.    Estimated Creatinine Clearance: 86.3 mL/min (by C-G formula based on SCr of 0.62 mg/dL).    No Known Allergies  Antimicrobials this admission: 8/5 cefepime >>  8/5 vancomycin x 1 dose   Dose adjustments this admission: None  Microbiology results: 8/5 BCx: NG x 3d  Thank you for allowing pharmacy to be a part of this patient's care.  Noralee Space, PharmD, BCPS 09/04/2018 12:51 PM

## 2018-09-04 NOTE — Progress Notes (Signed)
PHARMACY CONSULT NOTE - FOLLOW UP  Pharmacy Consult for Electrolyte Monitoring and Replacement   Recent Labs: Potassium (mmol/L)  Date Value  09/04/2018 4.6   Magnesium (mg/dL)  Date Value  09/04/2018 1.8   Calcium (mg/dL)  Date Value  09/04/2018 6.8 (L)   Albumin (g/dL)  Date Value  09/03/2018 2.0 (L)   Phosphorus (mg/dL)  Date Value  09/04/2018 2.1 (L)   Sodium (mmol/L)  Date Value  09/04/2018 142     Assessment: 53 year old female admitted with GI bleed, multiple organ failure. History of alcohol use PTA. Pharmacy consulted to replace electrolytes.  Goal of Therapy:  Electrolytes WNL   Plan:  8/8 @ 2200 K 4.6 Mg: 1.8.- No replacement warranted at this time.    F/U all electrolytes with AM labs.   Pharmacy to continue to follow and replace electrolytes as indicated.  Pernell Dupre, PharmD, BCPS Clinical Pharmacist 09/04/2018 11:45 PM

## 2018-09-04 NOTE — Progress Notes (Addendum)
PHARMACY CONSULT NOTE - FOLLOW UP  Pharmacy Consult for Electrolyte Monitoring and Replacement   Recent Labs: Potassium (mmol/L)  Date Value  09/04/2018 2.8 (L)   Magnesium (mg/dL)  Date Value  09/04/2018 1.8   Calcium (mg/dL)  Date Value  09/04/2018 6.8 (L)   Albumin (g/dL)  Date Value  09/03/2018 2.0 (L)   Phosphorus (mg/dL)  Date Value  09/04/2018 2.1 (L)   Sodium (mmol/L)  Date Value  09/04/2018 142     Assessment: 53 year old female admitted with GI bleed, multiple organ failure. History of alcohol use PTA. Pharmacy consulted to replace electrolytes.  Goal of Therapy:  Electrolytes WNL   Plan:  Will order potassium phosphate 15 mmol IV x 1 and KCL 61mEq IV x 4. Will also order Magnesium 1g IV x 1 dose.   Will recheck K+ @ 1400, F/U all electrolytes with AM labs.   Pharmacy to continue to follow and replace electrolytes as indicated.  Pernell Dupre, PharmD, BCPS Clinical Pharmacist 09/04/2018 4:51 AM

## 2018-09-04 NOTE — Progress Notes (Signed)
Dr. Mortimer Fries notified of increasing requirements for oxygen and that pt had to be placed on 15L by [facility]NC to maintain O2 sats above 88-90%. Decision made by Dr. Mortimer Fries to intubate pt. Pt's daughter notified by Dr. Mortimer Fries of decision to intubate pt, questions answered. Daughter stated that she would be to hospital in 30 minutes to an hour.

## 2018-09-04 NOTE — Progress Notes (Signed)
CRITICAL CARE NOTE Briefly, this is a 53 year old female with history of HTN, alcohol abuse, portal HTN, and PUD being seen in the ICU fo further management of hypovolemic/septic shock secondary to acute GI bleed, likely alcoholic hepatitis, w/ concomitant AKI, and current evidence of DT. On 8/5, she presented to the ED complaining of melena x 2 weeks, with weakness and a single episode of N/V. Initial vital signs indicated hypoTN with SBP <80. Initial labs with evidence of acute blood loss and elevated INR, elevated troponins, and evidence of AKI. She received LR, octreotide, vitamin K, and protonix in the ED, and was transferred to the ICU. In the ICU, a central line was placed and vasopressors were started to maintain MAP > 65. Received FFP and 2 units PRBC for coagulopathy and Hgb< 7.    CC  follow up DT's  SUBJECTIVE Patient remains critically ill Prognosis is guarded Remains encephlopathic On precedex +GIB Given several units of PRBC's High risk for intubation +vaginal bleeding-US pending   SIGNIFICANT EVENTS: -  09/04/2018: ED visit c/o melena and weakness. Hgb low, with hypotension. Started on vit K, octreotide, Protonix and IVF (LR). Brought to ICU. Central line placed. Required vasopressors. GI consulted. - 09/24/2018: EGD postponed, due to critical condition, per GI. Paracentesis planned, not completed due to lack of ascites on abdominal US. RUQ [place] with evidence of cirrhotic liver changes  Continued to require vasopressors. Weaned precedex as tolerated.  -8/8 remains on precedex, vasopressors     BP 93/76 (BP Location: Left Arm)   Pulse 75   Temp 98.8 F (37.1 C) (Axillary)   Resp (!) 26   Ht 5\' 6"  (1.676 m)   Wt 75.4 kg   SpO2 93%   BMI 26.83 kg/m    I/O last 3 completed shifts: In: 4688.8 [I.V.:2787.8; Blood:630; IV Piggyback:1270.9] Out: 4128 [Urine:5925] Total I/O In: 95.7 [I.V.:52; IV Piggyback:43.7] Out: -   SpO2: 93 % O2 Flow Rate (L/min): 4  L/min   SIGNIFICANT EVENTS   REVIEW OF SYSTEMS  PATIENT IS UNABLE TO PROVIDE COMPLETE REVIEW OF SYSTEMS DUE TO SEVERE CRITICAL ILLNESS   PHYSICAL EXAMINATION:  GENERAL:critically ill appearing,  HEAD: Normocephalic, atraumatic.  EYES: Pupils equal, round, reactive to light.  No scleral icterus.  MOUTH: Moist mucosal membrane. NECK: Supple. No thyromegaly. No nodules. No JVD.  PULMONARY: +rhonchi,  CARDIOVASCULAR: S1 and S2. Regular rate and rhythm. No murmurs, rubs, or gallops.  GASTROINTESTINAL: Soft, nontender, -distended. No masses. Positive bowel sounds. No hepatosplenomegaly.  MUSCULOSKELETAL: No swelling, clubbing, or edema.  NEUROLOGIC: obtunded SKIN:intact,warm,dry  MEDICATIONS: I have reviewed all medications and confirmed regimen as documented   CULTURE RESULTS   Recent Results (from the past 240 hour(s))  Culture, blood (routine x 2)     Status: None (Preliminary result)   Collection Time: 09/27/2018  1:25 PM   Specimen: BLOOD  Result Value Ref Range Status   Specimen Description BLOOD LEFT ANTECUBITAL  Final   Special Requests   Final    BOTTLES DRAWN AEROBIC AND ANAEROBIC Blood Culture adequate volume   Culture   Final    NO GROWTH 3 DAYS Performed at [facility], [address] [place], [place] [address]    Report Status PENDING  Incomplete  Culture, blood (routine x 2)     Status: None (Preliminary result)   Collection Time: 08/29/2018  1:39 PM   Specimen: BLOOD  Result Value Ref Range Status   Specimen Description BLOOD RIGHT ANTECUBITAL  Final  Special Requests   Final    BOTTLES DRAWN AEROBIC AND ANAEROBIC Blood Culture results may not be optimal due to an excessive volume of blood received in culture bottles   Culture   Final    NO GROWTH 3 DAYS Performed at [facility], [address], [place], [place] [address]    Report Status PENDING  Incomplete  SARS Coronavirus 2 [facility] order, Performed in [place] hospital lab)      Status: None   Collection Time: 08/28/2018  4:22 PM  Result Value Ref Range Status   SARS Coronavirus 2 NEGATIVE NEGATIVE Final    Comment: (NOTE) SARS CoV 2 target nucleic acids are NOT DETECTED. The SARS CoV 2 RNA is generally detectable in upper and lower respiratory specimens during the acute phase of infection. The lowest concentration of SARS CoV 2 viral copies this assay can detect is 250 copies per mL. A negative result does not preclude SARS CoV 2 infection and should not be used as the sole basis for treatment or other patient management decisions. A negative result may occur with improper specimen collection and handling, submission  of specimen other than nasopharyngeal swab, presence of viral mutation(s) within the areas targeted by this assay, and inadequate number of viral copies (less than 250 copies per mL). A negative result must be combined with clinical observations, patient history,  and epidemiological information. The expected result is Negative. Fact Sheet for Patients:   https [employer] 814481 download Fact Sheet for Healthcare Providers:   https [employer] 424 396 6566 d Jenel Lucks This test is not yet approved or cleared by the [place] FDA and  has been authorized for detection and/or diagnosis of SARS CoV 2 by FDA under an Emergency Use Authorization (EUA).  This EUA will remain in effect (meaning this test can be used) for the duration of  the COVID19 declaration under Section 564(b)(1) of the Act, 21 U.S.C.  section 360bbb-3(b)(1), unless the authorization is terminated or revoked sooner. Performed at [facility], [address] [place], [place] [address]   MRSA PCR Screening     Status: None   Collection Time: 09/14/2018  8:38 PM   Specimen: Nasal Mucosa; Nasopharyngeal  Result Value Ref Range Status   MRSA by PCR NEGATIVE NEGATIVE Final    Comment:        The GeneXpert MRSA Assay (FDA approved for NASAL specimens only),  is one component of a comprehensive MRSA colonization surveillance program. It is not intended to diagnose MRSA infection nor to guide or monitor treatment for MRSA infections. Performed at [facility], [address], [place], [place] [address]            Indwelling Urinary Catheter continued, requirement due to   Reason to continue Indwelling Urinary Catheter strict Intake/Output monitoring for hemodynamic instability   Central Line/ continued, requirement due to  Reason to continue [place] of central venous pressure or other hemodynamic parameters and poor IV access        ASSESSMENT AND PLAN SYNOPSIS  53 yo with severe metabolic encephalopathy from DT's with ETOH liver cirrhosis and renal failure   ACUTE KIDNEY INJURY/Renal Failure -follow chem 7 -follow UO -continue Foley Catheter-assess need -Avoid nephrotoxic agents   NEUROLOGY Severe encephalopathy from DT's On precedex High risk for intubation   SHOCK-SEPSIS/HYPOVOLUMIC -use vasopressors to keep MAP>65 -follow up cultures -emperic ABX -aggressive IV fluid resuscitation  CARDIAC ICU monitoring  ID -continue IV abx as prescibed -  follow up cultures  GI GI PROPHYLAXIS as indicated  NUTRITIONAL STATUS DIET-->NPO Constipation protocol as indicated  ENDO - will use ICU hypoglycemic\Hyperglycemia protocol if indicated   ELECTROLYTES -follow labs as needed -replace as needed -pharmacy consultation and following   DVT/GI PRX ordered TRANSFUSIONS AS NEEDED MONITOR FSBS ASSESS the need for LABS as needed   Critical Care Time devoted to patient care services described in this note is 34 minutes.   Overall, patient is critically ill, prognosis is guarded.  Patient with Multiorgan failure and at high risk for cardiac arrest and death.    Corrin Parker, M.D.  [facility]  Medical Director [place] Director  [facility] Cardio-Pulmonary Department

## 2018-09-04 NOTE — Procedures (Signed)
Endotracheal Intubation: Patient required placement of an artificial airway secondary to Respiratory Failure  Consent: Emergent.   Hand washing performed prior to starting the procedure.   Medications administered for sedation prior to procedure:  Midazolam 4 mg IV,  Vecuronium 10 mg IV, Fentanyl 100 mcg IV.    A time out procedure was called and correct patient, name, & ID confirmed. Needed supplies and equipment were assembled and checked to include ETT, 10 ml syringe, Glidescope, Mac and Miller blades, suction, oxygen and bag mask valve, end tidal CO2 monitor.   Patient was positioned to align the mouth and pharynx to facilitate visualization of the glottis.   Heart rate, SpO2 and blood pressure was continuously monitored during the procedure. Pre-oxygenation was conducted prior to intubation and endotracheal tube was placed through the vocal cords into the trachea.     The artificial airway was placed under direct visualization via glidescope route using a 8.0 ETT on the first attempt.  ETT was secured at 23 cm mark.  Placement was confirmed by auscuitation of lungs with good breath sounds bilaterally and no stomach sounds.  Condensation was noted on endotracheal tube.   Pulse ox 98%.  CO2 detector in place with appropriate color change.   Complications: None .   Operator: Rica Heather.   Chest radiograph ordered and pending.   Extensive amounts of secretions oozing out of airway  Corrin Parker, M.D.  [facility]  Medical Director [place] Director [facility] Cardio-Pulmonary Department

## 2018-09-05 ENCOUNTER — Inpatient Hospital Stay: Payer: Medicaid Other

## 2018-09-05 DIAGNOSIS — R748 Abnormal levels of other serum enzymes: Secondary | ICD-10-CM

## 2018-09-05 LAB — GLUCOSE, CAPILLARY
Glucose-Capillary: 101 mg/dL — ABNORMAL HIGH (ref 70–99)
Glucose-Capillary: 105 mg/dL — ABNORMAL HIGH (ref 70–99)
Glucose-Capillary: 105 mg/dL — ABNORMAL HIGH (ref 70–99)
Glucose-Capillary: 114 mg/dL — ABNORMAL HIGH (ref 70–99)
Glucose-Capillary: 117 mg/dL — ABNORMAL HIGH (ref 70–99)
Glucose-Capillary: 118 mg/dL — ABNORMAL HIGH (ref 70–99)
Glucose-Capillary: 123 mg/dL — ABNORMAL HIGH (ref 70–99)
Glucose-Capillary: 170 mg/dL — ABNORMAL HIGH (ref 70–99)
Glucose-Capillary: 77 mg/dL (ref 70–99)

## 2018-09-05 LAB — TYPE AND SCREEN
ABO/RH(D): O POS
Antibody Screen: NEGATIVE
Unit division: 0
Unit division: 0
Unit division: 0
Unit division: 0
Unit division: 0
Unit division: 0
Unit division: 0
Unit division: 0
Unit division: 0

## 2018-09-05 LAB — CBC
HCT: 24.7 % — ABNORMAL LOW (ref 36.0–46.0)
Hemoglobin: 8.2 g/dL — ABNORMAL LOW (ref 12.0–15.0)
MCH: 26.9 pg (ref 26.0–34.0)
MCHC: 33.2 g/dL (ref 30.0–36.0)
MCV: 81 fL (ref 80.0–100.0)
Platelets: 113 10*3/uL — ABNORMAL LOW (ref 150–400)
RBC: 3.05 MIL/uL — ABNORMAL LOW (ref 3.87–5.11)
RDW: 17.8 % — ABNORMAL HIGH (ref 11.5–15.5)
WBC: 17.4 10*3/uL — ABNORMAL HIGH (ref 4.0–10.5)
nRBC: 2.8 % — ABNORMAL HIGH (ref 0.0–0.2)

## 2018-09-05 LAB — BPAM RBC
Blood Product Expiration Date: 202008082359
Blood Product Expiration Date: 202008102359
Blood Product Expiration Date: 202008192359
Blood Product Expiration Date: 202008192359
Blood Product Expiration Date: 202008192359
Blood Product Expiration Date: 202008272359
Blood Product Expiration Date: 202009012359
Blood Product Expiration Date: 202009052359
Blood Product Expiration Date: 202009052359
ISSUE DATE / TIME: 202008051508
ISSUE DATE / TIME: 202008052142
ISSUE DATE / TIME: 202008060436
ISSUE DATE / TIME: 202008071100
ISSUE DATE / TIME: 202008072354
ISSUE DATE / TIME: 202008081149
ISSUE DATE / TIME: 202008081810
Unit Type and Rh: 5100
Unit Type and Rh: 5100
Unit Type and Rh: 5100
Unit Type and Rh: 5100
Unit Type and Rh: 5100
Unit Type and Rh: 5100
Unit Type and Rh: 5100
Unit Type and Rh: 5100
Unit Type and Rh: 9500

## 2018-09-05 LAB — COMPREHENSIVE METABOLIC PANEL
ALT: UNDETERMINED U/L (ref 0–44)
AST: 50 U/L — ABNORMAL HIGH (ref 15–41)
Albumin: 1.7 g/dL — ABNORMAL LOW (ref 3.5–5.0)
Alkaline Phosphatase: 38 U/L (ref 38–126)
Anion gap: 10 (ref 5–15)
BUN: 37 mg/dL — ABNORMAL HIGH (ref 6–20)
CO2: 15 mmol/L — ABNORMAL LOW (ref 22–32)
Calcium: 6.8 mg/dL — ABNORMAL LOW (ref 8.9–10.3)
Chloride: 118 mmol/L — ABNORMAL HIGH (ref 98–111)
Creatinine, Ser: UNDETERMINED mg/dL (ref 0.44–1.00)
Glucose, Bld: 125 mg/dL — ABNORMAL HIGH (ref 70–99)
Potassium: 4.2 mmol/L (ref 3.5–5.1)
Sodium: 143 mmol/L (ref 135–145)
Total Bilirubin: 15 mg/dL — ABNORMAL HIGH (ref 0.3–1.2)
Total Protein: 6.5 g/dL (ref 6.5–8.1)

## 2018-09-05 LAB — BASIC METABOLIC PANEL
Anion gap: 9 (ref 5–15)
BUN: 34 mg/dL — ABNORMAL HIGH (ref 6–20)
CO2: 16 mmol/L — ABNORMAL LOW (ref 22–32)
Calcium: 6.8 mg/dL — ABNORMAL LOW (ref 8.9–10.3)
Chloride: 119 mmol/L — ABNORMAL HIGH (ref 98–111)
Creatinine, Ser: 1.16 mg/dL — ABNORMAL HIGH (ref 0.44–1.00)
GFR calc Af Amer: >60 mL/min (ref >60)
GFR calc non Af Amer: 54 mL/min — ABNORMAL LOW (ref >60)
Glucose, Bld: 108 mg/dL — ABNORMAL HIGH (ref 70–99)
Potassium: 4.3 mmol/L (ref 3.5–5.1)
Sodium: 144 mmol/L (ref 135–145)

## 2018-09-05 LAB — PHOSPHORUS: Phosphorus: 2.5 mg/dL (ref 2.5–4.6)

## 2018-09-05 LAB — MAGNESIUM: Magnesium: 1.8 mg/dL (ref 1.7–2.4)

## 2018-09-05 MED ORDER — DEXTROSE 50 % IV SOLN
25.0000 g | Freq: Once | INTRAVENOUS | Status: AC
  Administered 2018-09-05: 25 g via INTRAVENOUS
  Filled 2018-09-05: qty 50

## 2018-09-05 MED ORDER — SODIUM CHLORIDE 0.9 % IV SOLN
2.0000 g | Freq: Two times a day (BID) | INTRAVENOUS | Status: DC
  Administered 2018-09-06 – 2018-09-07 (×3): 2 g via INTRAVENOUS
  Filled 2018-09-05 (×5): qty 2

## 2018-09-05 MED ORDER — DEXTROSE 10 % IV SOLN
INTRAVENOUS | Status: DC
  Administered 2018-09-05 – 2018-09-06 (×2): via INTRAVENOUS

## 2018-09-05 MED ORDER — MAGNESIUM SULFATE 2 GM/50ML IV SOLN
2.0000 g | Freq: Once | INTRAVENOUS | Status: AC
  Administered 2018-09-05: 2 g via INTRAVENOUS
  Filled 2018-09-05: qty 50

## 2018-09-05 NOTE — Progress Notes (Signed)
[facility] [place] NAME: Mackenzie Little    MR#:  [id number]  DATE OF BIRTH:  Mar 19, 1965  SUBJECTIVE:  CHIEF COMPLAINT:   Chief Complaint  Patient presents with  . Constipation  . Chest Pain  Patient seen and evaluated today Has been intubated put on ventilator by ICU attending Currently on IV pressor medication to support blood pressure On mechanical ventilator Tidal volume 500 Rate 16 FiO2 40%  REVIEW OF SYSTEMS:    ROS  On mechanical ventilator  DRUG ALLERGIES:  No Known Allergies  VITALS:  Blood pressure (!) 90/46, pulse 76, temperature 99.2 F (37.3 C), temperature source Axillary, resp. rate 17, height 5\' 6"  (1.676 m), weight 75.4 kg, SpO2 96 %.  PHYSICAL EXAMINATION:   Physical Exam  GENERAL:  53 y.o.-year-old patient lying in the bed on ventilator EYES: Pupils equal, round, reactive to light and accommodation. No scleral icterus. Extraocular muscles intact.  HEENT: Head atraumatic, normocephalic. Oropharynx endotracheal tube noted NECK:  Supple, no jugular venous distention. No thyroid enlargement, no tenderness.  LUNGS: Improved breath sounds bilaterally, scattered rales in both lungs.  On ventilator CARDIOVASCULAR: S1, S2 normal. No murmurs, rubs, or gallops.  ABDOMEN: Soft, nontender, nondistended. Bowel sounds present. No organomegaly or mass.  EXTREMITIES: No cyanosis, clubbing or edema b/l.    NEUROLOGIC: Complete nervous system exam was not possible as patient is on ventilator PSYCHIATRIC: The patient is alert and oriented x none.  SKIN: No obvious rash, lesion, or ulcer.   LABORATORY PANEL:   CBC Recent Labs  Lab 09/05/18 0534  WBC 17.4*  HGB 8.2*  HCT 24.7*  PLT 113*   ------------------------------------------------------------------------------------------------------------------ Chemistries  Recent Labs  Lab 09/03/18 0500  09/05/18 0534  NA 139   < > 144  K 2.9*   < > 4.3  CL 113*   < >  119*  CO2 20*   < > 16*  GLUCOSE 111*   < > 108*  BUN 50*   < > 34*  CREATININE 1.22*   < > 1.16*  CALCIUM 6.7*   < > 6.8*  MG 1.1*   < > 1.8  AST 102*  --   --   ALT 28  --   --   ALKPHOS 49  --   --   BILITOT 10.6*  --   --    < > = values in this interval not displayed.   ------------------------------------------------------------------------------------------------------------------  Cardiac Enzymes No results for input(s): TROPONINI in the last 168 hours. ------------------------------------------------------------------------------------------------------------------  RADIOLOGY:  US Pelvis (transabdominal Only)  Result Date: 09/04/2018 CLINICAL DATA:  Vaginal bleeding EXAM: TRANSABDOMINAL ULTRASOUND OF PELVIS TECHNIQUE: Transabdominal ultrasound examination of the pelvis was performed including evaluation of the uterus, ovaries, adnexal regions, and pelvic cul-de-sac. COMPARISON:  12/27/2008 FINDINGS: Uterus Measurements: 8.1 x 4.3 x 5.9 cm = volume: 109 mL. Left uterine body/fundal fibroid of 3.5 x 3.1 x 3.4 cm. Endometrium Thickness: Normal, 3 mm.  No focal abnormality visualized. Right ovary Obscured by bowel gas. Left ovary Measurements: 2.7 x 1.8 x 2.6 cm = volume: 6.7 mL. Normal appearance/no adnexal mass. Other findings:  Trace free pelvic fluid is likely physiologic. IMPRESSION: 1. Normal appearance of the endometrium. 2. Uterine fundal/body fibroid. 3. Trace free fluid. Electronically Signed   By: Abigail Miyamoto M.D.   On: 09/04/2018 13:01   Dg Chest Port 1 View  Result Date: 09/05/2018 CLINICAL DATA:  Acute respiratory failure EXAM: PORTABLE CHEST 1 VIEW COMPARISON:  Chest radiograph from one day prior. FINDINGS: Endotracheal tube tip is 2.0 cm above the carina. Stable cardiomediastinal silhouette with top-normal heart size. No pneumothorax. No pleural effusion. Extensive patchy consolidation throughout the mid to lower lungs bilaterally, not appreciably changed. IMPRESSION: 1.  Well-positioned endotracheal tube. 2. Extensive patchy consolidation throughout the mid to lower lungs bilaterally, not appreciably changed, favor multilobar pneumonia. Electronically Signed   By: Ilona Sorrel M.D.   On: 09/05/2018 07:12   Dg Chest Port 1 View  Result Date: 09/04/2018 CLINICAL DATA:  Possible aspiration.  Intubation. EXAM: PORTABLE CHEST 1 VIEW COMPARISON:  July 06, 2017 FINDINGS: The ETT terminates 2.2 cm above the carina in good position. No pneumothorax. Bibasilar pulmonary infiltrates, right greater than left. The cardiomediastinal silhouette is stable. IMPRESSION: 1. The ETT is in good position. 2. Bilateral pulmonary infiltrates in the bases, right much greater than left may represent aspiration versus multifocal pneumonia. Recommend clinical correlation and follow-up to resolution. Electronically Signed   By: Dorise Bullion III M.D   On: 09/04/2018 12:58     ASSESSMENT AND PLAN:   53 year old female patientwith a known history ofhypertension, alcohol abuse presented to the emergency room for generalized weakness.Patient has been fatigued for the last 3 days and noticed dark tarry stool for the last couple of days.   Patient was tired and fatigued and had labored breathing this morning and has been electively intubated and put on ventilator by intensivist.  -Hypovolemic shock and multiorgan failure -due to active GI bleed -  Initial labs with evidence of acute blood loss (Hgb 6.5), and elevated INR, elevated troponins, and evidence of AKI (Cr 2.47, GFR 25) - on vasopressors to maintain MAP >65. Status post 2U PRBC and FFP last night.   -Acute respiratory failure with hypoxia Mechanical ventilator to continue Vent bundle Intensivist follow-up  -Acute gastrointestinal bleeding -on octreotide and Protonix drip - melena x 2 weeks, received LR, octreotide, vitamin K, and protonix  - GI is following the patient, and was planning on performing an EGD this AM. However,  given her critically ill condition, and evidence of multi-organ failure, they have decided to post-pone the procedure and reassess tomorrow. Plan for paracentesis today, pending INR results  -Acute symptomatic severe anemia - status post transfuse 2 units PRBC   -Acute kidney injury: Likely due to ATN due to hypotension Avoid nephrotoxic medications IV fluids and monitor renal function  -Elevated lactic acid -likely due to multiorgan failure and hypotension/dehydration Follow-up levels Empiric broad spectrum antibiotics  -Elevated troponins  -Likely from demand ischemia  -Acute hypokalemia Potassium to be replaced aggressively  -Long-term prognosis poor  All the records are reviewed and case discussed with Care Management/Social Worker. Management plans discussed with the patient, family and they are in agreement.  CODE STATUS: Full code  DVT Prophylaxis: SCDs  TOTAL TIME TAKING CARE OF THIS PATIENT: 35 minutes.   POSSIBLE D/C IN 4 to 5 DAYS, DEPENDING ON CLINICAL CONDITION.  Saundra Shelling M.D on 09/05/2018 at 12:53 PM  Between 7am to 6pm - Pager - [phone]  After 6pm go to [email or web address] - password [email or web address] [facility]  Office  [phone]  CC: Primary care physician; Ranae Plumber, PA  Note: This dictation was prepared with Dragon dictation along with smaller phrase technology. Any transcriptional errors that result from this process are unintentional.

## 2018-09-05 NOTE — Progress Notes (Addendum)
Vonda Antigua, MD [address] [place], [place], [place], [address] [address] [place], [place], [place], [address] Phone: [phone]  Fax: [phone]   Subjective:  Patient is now intubated due to pneumonia  Objective: Exam: Vital signs in last 24 hours: Vitals:   09/05/18 0800 09/05/18 0900 09/05/18 1000 09/05/18 1100  BP: (!) 100/58 (!) 101/54 (!) 103/54 (!) 98/52  Pulse: 83 79 79 78  Resp: '17 17 20 17  ' Temp: 100.2 F (37.9 C)     TempSrc: Axillary     SpO2: 98% 98% 97% 98%  Weight:      Height:       Weight change:   Intake/Output Summary (Last 24 hours) at 09/05/2018 1219 Last data filed at 09/05/2018 1000 Gross per 24 hour  Intake 2419.45 ml  Output 500 ml  Net 1919.45 ml    General: No acute distress, AAO x3 Abd: Soft, NT/ND, No HSM Skin: Warm, no rashes Neck: Supple, Trachea midline   Lab Results: Lab Results  Component Value Date   WBC 17.4 (H) 09/05/2018   HGB 8.2 (L) 09/05/2018   HCT 24.7 (L) 09/05/2018   MCV 81.0 09/05/2018   PLT 113 (L) 09/05/2018   Micro Results: Recent Results (from the past 240 hour(s))  Culture, blood (routine x 2)     Status: None (Preliminary result)   Collection Time: 09/06/2018  1:25 PM   Specimen: BLOOD  Result Value Ref Range Status   Specimen Description BLOOD LEFT ANTECUBITAL  Final   Special Requests   Final    BOTTLES DRAWN AEROBIC AND ANAEROBIC Blood Culture adequate volume   Culture   Final    NO GROWTH 4 DAYS Performed at [facility], [address] [place], [place] [address]    Report Status PENDING  Incomplete  Culture, blood (routine x 2)     Status: None (Preliminary result)   Collection Time: 09/23/2018  1:39 PM   Specimen: BLOOD  Result Value Ref Range Status   Specimen Description BLOOD RIGHT ANTECUBITAL  Final   Special Requests   Final    BOTTLES DRAWN AEROBIC AND ANAEROBIC Blood Culture results may not be optimal due to an excessive volume of blood received in culture  bottles   Culture   Final    NO GROWTH 4 DAYS Performed at [facility], [address] [place], [place] [address]    Report Status PENDING  Incomplete  SARS Coronavirus 2 [facility] order, Performed in [place] hospital lab)     Status: None   Collection Time: 09/03/2018  4:22 PM  Result Value Ref Range Status   SARS Coronavirus 2 NEGATIVE NEGATIVE Final    Comment: (NOTE) SARS CoV 2 target nucleic acids are NOT DETECTED. The SARS CoV 2 RNA is generally detectable in upper and lower respiratory specimens during the acute phase of infection. The lowest concentration of SARS CoV 2 viral copies this assay can detect is 250 copies per mL. A negative result does not preclude SARS CoV 2 infection and should not be used as the sole basis for treatment or other patient management decisions. A negative result may occur with improper specimen collection and handling, submission  of specimen other than nasopharyngeal swab, presence of viral mutation(s) within the areas targeted by this assay, and inadequate number of viral copies (less than 250 copies per mL). A negative result must be combined with clinical observations, patient history,  and epidemiological information. The expected result is  Negative. Fact Sheet for Patients:   https [employer] media 229798 download Fact Sheet for Healthcare Providers:   https [employer] media (260)420-3743 d Jenel Lucks This test is not yet approved or cleared by the [place] FDA and  has been authorized for detection and/or diagnosis of SARS CoV 2 by FDA under an Emergency Use Authorization (EUA).  This EUA will remain in effect (meaning this test can be used) for the duration of  the COVID19 declaration under Section 564(b)(1) of the Act, 21 U.S.C.  section 360bbb-3(b)(1), unless the authorization is terminated or revoked sooner. Performed at [facility] [address] [place], [place] [address]   MRSA PCR Screening     Status:  None   Collection Time: 09/14/2018  8:38 PM   Specimen: Nasal Mucosa; Nasopharyngeal  Result Value Ref Range Status   MRSA by PCR NEGATIVE NEGATIVE Final    Comment:        The GeneXpert MRSA Assay (FDA approved for NASAL specimens only), is one component of a comprehensive MRSA colonization surveillance program. It is not intended to diagnose MRSA infection nor to guide or monitor treatment for MRSA infections. Performed at [facility], [address] [place], [place] [address]    Studies/Results: US Pelvis (transabdominal Only)  Result Date: 09/04/2018 CLINICAL DATA:  Vaginal bleeding EXAM: TRANSABDOMINAL ULTRASOUND OF PELVIS TECHNIQUE: Transabdominal ultrasound examination of the pelvis was performed including evaluation of the uterus, ovaries, adnexal regions, and pelvic cul-de-sac. COMPARISON:  12/27/2008 FINDINGS: Uterus Measurements: 8.1 x 4.3 x 5.9 cm = volume: 109 mL. Left uterine body/fundal fibroid of 3.5 x 3.1 x 3.4 cm. Endometrium Thickness: Normal, 3 mm.  No focal abnormality visualized. Right ovary Obscured by bowel gas. Left ovary Measurements: 2.7 x 1.8 x 2.6 cm = volume: 6.7 mL. Normal appearance/no adnexal mass. Other findings:  Trace free pelvic fluid is likely physiologic. IMPRESSION: 1. Normal appearance of the endometrium. 2. Uterine fundal/body fibroid. 3. Trace free fluid. Electronically Signed   By: Abigail Miyamoto M.D.   On: 09/04/2018 13:01   Dg Chest Port 1 View  Result Date: 09/05/2018 CLINICAL DATA:  Acute respiratory failure EXAM: PORTABLE CHEST 1 VIEW COMPARISON:  Chest radiograph from one day prior. FINDINGS: Endotracheal tube tip is 2.0 cm above the carina. Stable cardiomediastinal silhouette with top-normal heart size. No pneumothorax. No pleural effusion. Extensive patchy consolidation throughout the mid to lower lungs bilaterally, not appreciably changed. IMPRESSION: 1. Well-positioned endotracheal tube. 2. Extensive patchy consolidation throughout  the mid to lower lungs bilaterally, not appreciably changed, favor multilobar pneumonia. Electronically Signed   By: Ilona Sorrel M.D.   On: 09/05/2018 07:12   Dg Chest Port 1 View  Result Date: 09/04/2018 CLINICAL DATA:  Possible aspiration.  Intubation. EXAM: PORTABLE CHEST 1 VIEW COMPARISON:  July 06, 2017 FINDINGS: The ETT terminates 2.2 cm above the carina in good position. No pneumothorax. Bibasilar pulmonary infiltrates, right greater than left. The cardiomediastinal silhouette is stable. IMPRESSION: 1. The ETT is in good position. 2. Bilateral pulmonary infiltrates in the bases, right much greater than left may represent aspiration versus multifocal pneumonia. Recommend clinical correlation and follow-up to resolution. Electronically Signed   By: Dorise Bullion III M.D   On: 09/04/2018 12:58   Medications:  Scheduled Meds: . chlorhexidine gluconate (MEDLINE KIT)  15 mL Mouth Rinse BID  . Chlorhexidine Gluconate Cloth  6 each Topical QHS  . folic acid  1 mg Intravenous Daily  . mouth rinse  15 mL Mouth  Rinse 10 times per day  . pantoprazole (PROTONIX) IV  40 mg Intravenous Once  . pantoprazole  40 mg Intravenous Q12H  . pentoxifylline  400 mg Oral TID WC  . sodium chloride flush  10-40 mL Intracatheter Q12H  . thiamine injection  100 mg Intravenous Daily   Continuous Infusions: . sodium chloride 250 mL (09/04/18 0904)  . ceFEPime (MAXIPIME) IV Stopped (09/05/18 8177)  . dexmedetomidine (PRECEDEX) IV infusion Stopped (09/04/18 1111)  . dextrose 30 mL/hr at 09/05/18 1000  . fentaNYL infusion INTRAVENOUS 125 mcg/hr (09/05/18 1000)  . metronidazole 500 mg (09/05/18 1033)  . norepinephrine (LEVOPHED) Adult infusion 20 mcg/min (09/05/18 1032)  . octreotide  (SANDOSTATIN)    IV infusion 50 mcg/hr (09/05/18 1217)  . vasopressin (PITRESSIN) infusion - *FOR SHOCK* 0.03 Units/min (09/05/18 1000)   PRN Meds:.sodium chloride, acetaminophen **OR** acetaminophen, LORazepam, ondansetron **OR**  ondansetron (ZOFRAN) IV, sodium chloride flush, traMADol   Assessment: Active Problems:   GI bleed   Cirrhosis of liver (HCC)   Lactic acidosis   Liver failure without hepatic coma ([place])    Plan: No further episodes of active GI bleeding.  Hemoglobin has stayed stable around 8 for the last 2 days.  Patient remains high risk for endoscopy given clinical status and pneumonia at this time as well. However, Dr. Alice Reichert can consider timing of endoscopy based on improvement, clinical status and labs. Pt is already intubated and sedated at this time and an endoscopy could be possible since she is already sedated. Will defer to Dr. Alice Reichert.   Continue octreotide drip and Protonix IV Please page GI on call with any signs of active GI bleeding  Octreotide drip should be discontinued either after endoscopy depending on findings or after 7 days of administration if endoscopy has not been done.   Liver enzymes have not been repeated in 2 days, I have ordered add-on testing (Repeat testing shows elevated Bili at 15. Normal Alk Phos. Likely cholestasis from acute illness and infection. On pentoxyfylline started by Dr. Alice Reichert. Continue to avoid hepatotoxic drugs. Monitor CMP. If continues to worsen inpatient GI team can consider MRCP)  Prognosis remains guarded  Dr. Alice Reichert will resume care of the patient tomorrow   LOS: 4 days   Vonda Antigua, MD 09/05/2018, 12:19 PM

## 2018-09-05 NOTE — Progress Notes (Signed)
Patient had one small black BM with red streaks but Hgb maintained at 8.2 this AM. Mg low but will be replaced. Had a max temp of 102.8, administered a Tylenol suppository and it came down to 100.6. Skin remains yellow tinged with yellow scleras. Ciwa-ar has been negative. 50% on vent. Was hypoglycemic this shift, had 1 & 1/2 amp D50 and started on D10 gtt at 30 ml/hr. Only had 350 ml tea colored urine this shift. Family called twice this shift to check on patient. Plan for EGD when patient more stable.

## 2018-09-05 NOTE — Consult Note (Signed)
Pharmacy Antibiotic Note  Mackenzie Little is a 53 y.o. female admitted on 09/18/2018 with sepsis.  Pharmacy has been consulted for cefepime dosing.  Vancmycin d/c 09/18/2018. Being intubated 8/8  Plan: Day 5- Scr 1.22>0.62>1.16.  Now Crcl 59 ml/min will adjust Cefepime 2 g q12h     Height: 5\' 6"  (167.6 cm) Weight: 166 lb 3.6 oz (75.4 kg) IBW/kg (Calculated) : 59.3Will give vancomycin 1750 mg x 1 dose and order a 24 hour level. Renal function is not stable, will dose by levels.   Temp (24hrs), Avg:99.9 F (37.7 C), Min:97.5 F (36.4 C), Max:102.8 F (39.3 C)  Recent Labs  Lab 08/29/2018 1315  09/05/2018 2116 09/24/2018 2328 09/16/2018 0130 09/27/2018 0455 09/10/2018 0827 09/03/18 0500 09/04/18 0400 09/05/18 0534  WBC 10.6*  --   --   --   --  20.1*  --  8.4 10.6* 17.4*  CREATININE 2.47*  --   --   --   --  3.00*  --  1.22* 0.62 1.16*  LATICACIDVEN 7.9*   < > 8.5* 8.2* 6.6* 3.7* 2.0*  --   --   --    < > = values in this interval not displayed.    Estimated Creatinine Clearance: 59.5 mL/min (A) (by C-G formula based on SCr of 1.16 mg/dL (H)).    No Known Allergies  Antimicrobials this admission: 8/5 cefepime >>  8/5 vancomycin x 1 dose   Dose adjustments this admission: None  Microbiology results: 8/5 BCx: NG x 3d  Thank you for allowing pharmacy to be a part of this patient's care.  Mackenzie Little A, PharmD, BCPS 09/05/2018 2:24 PM

## 2018-09-05 NOTE — Progress Notes (Signed)
PHARMACY CONSULT NOTE - FOLLOW UP  Pharmacy Consult for Electrolyte Monitoring and Replacement   Recent Labs: Potassium (mmol/L)  Date Value  09/05/2018 4.3   Magnesium (mg/dL)  Date Value  09/05/2018 1.8   Calcium (mg/dL)  Date Value  09/05/2018 6.8 (L)   Albumin (g/dL)  Date Value  09/03/2018 2.0 (L)   Phosphorus (mg/dL)  Date Value  09/05/2018 2.5   Sodium (mmol/L)  Date Value  09/05/2018 144   Corrected Calcium: 8.4   Assessment: 53 year old female admitted with GI bleed, multiple organ failure. History of alcohol use PTA. Pharmacy consulted to replace electrolytes.  Goal of Therapy:  Electrolytes WNL   Plan:  8/9 @ 0534 K 4.3 Mg: 1.8. Will order Magnesium 2g IV x 1 dose. No additional replacement needed at this time.    F/U all electrolytes with AM labs.   Pharmacy to continue to follow and replace electrolytes as indicated.  Pernell Dupre, PharmD, BCPS Clinical Pharmacist 09/05/2018 6:12 AM

## 2018-09-05 NOTE — Progress Notes (Signed)
CRITICAL CARE NOTE Briefly, this is a 53 year old female with history ofHTN, alcohol abuse, portal HTN, and PUD being seen in the ICU fo further management of hypovolemic/septic shock secondary to acute GI bleed, likely alcoholic hepatitis, w/ concomitant AKI, and current evidence of DT. On 8/5, she presented to the ED complaining of melena x 2 weeks, with weakness and a single episode of N/V. Initial vital signs indicated hypoTN with SBP <80. Initial labs with evidence of acute blood loss and elevated INR, elevated troponins, and evidence of AKI.She received LR, octreotide, vitamin K, and protonix in the ED, and was transferred to the ICU.In the ICU, a central line was placed and vasopressors were started to maintain MAP >65. Received FFP and 2 units PRBC for coagulopathy and Hgb<7.   Patient with progressive encephalopathy and resp failure Now on vent support  CC  follow up respiratory failure  SUBJECTIVE Patient remains critically ill Prognosis is guarded On vent +GIB +liver failure   BP (!) 103/56   Pulse 81   Temp (!) 100.6 F (38.1 C) (Oral)   Resp 16   Ht 5\' 6"  (1.676 m)   Wt 75.4 kg   SpO2 99%   BMI 26.83 kg/m    I/O last 3 completed shifts: In: 4283.7 [I.V.:2836.5; Blood:330; IV Piggyback:1117.2] Out: 2025 [Urine:2025] No intake/output data recorded.  SpO2: 99 % O2 Flow Rate (L/min): (S) 15 L/min FiO2 (%): 50 %   SIGNIFICANT EVENTS SIGNIFICANT EVENTS: -09/12/2018: ED visit c/o melena and weakness. Hgb low, with hypotension. Started on vit K, octreotide, Protonix and IVF (LR). Brought to ICU. Central line placed. Required vasopressors. GI consulted. - 09/12/2018: EGD postponed, due to critical condition, per GI. Paracentesis planned, not completed due to lack of ascites on abdominal US. RUQ [place] with evidence of cirrhotic liver changes  Continued to require vasopressors. Weaned precedex as tolerated.  -8/8 remains on precedex, vasopressors 8/8 intubated,  aspiration pneumonia, vent support  REVIEW OF SYSTEMS  PATIENT IS UNABLE TO PROVIDE COMPLETE REVIEW OF SYSTEMS DUE TO SEVERE CRITICAL ILLNESS   PHYSICAL EXAMINATION:  GENERAL:critically ill appearing, +resp distress icteric sclera +jaundice HEAD: Normocephalic, atraumatic.  EYES: Pupils equal, round, reactive to light.  No scleral icterus.  MOUTH: Moist mucosal membrane. NECK: Supple. No thyromegaly. No nodules. No JVD.  PULMONARY: +rhonchi, +wheezing CARDIOVASCULAR: S1 and S2. Regular rate and rhythm. No murmurs, rubs, or gallops.  GASTROINTESTINAL: Soft, nontender, -distended. No masses. Positive bowel sounds. No hepatosplenomegaly.  MUSCULOSKELETAL: No swelling, clubbing, or edema.  NEUROLOGIC: obtunded, GCS<8 SKIN:intact,warm,dry  MEDICATIONS: I have reviewed all medications and confirmed regimen as documented   CULTURE RESULTS   Recent Results (from the past 240 hour(s))  Culture, blood (routine x 2)     Status: None (Preliminary result)   Collection Time: 09/11/2018  1:25 PM   Specimen: BLOOD  Result Value Ref Range Status   Specimen Description BLOOD LEFT ANTECUBITAL  Final   Special Requests   Final    BOTTLES DRAWN AEROBIC AND ANAEROBIC Blood Culture adequate volume   Culture   Final    NO GROWTH 4 DAYS Performed at [facility], [address] [place], [place] [address]    Report Status PENDING  Incomplete  Culture, blood (routine x 2)     Status: None (Preliminary result)   Collection Time: 09/02/2018  1:39 PM   Specimen: BLOOD  Result Value Ref Range Status   Specimen Description BLOOD RIGHT ANTECUBITAL  Final   Special Requests  Final    BOTTLES DRAWN AEROBIC AND ANAEROBIC Blood Culture results may not be optimal due to an excessive volume of blood received in culture bottles   Culture   Final    NO GROWTH 4 DAYS Performed at [facility] [place] [facility], [address], [place], [place] [address]    Report Status PENDING  Incomplete  SARS Coronavirus  2 [facility] order, Performed in [place] hospital lab)     Status: None   Collection Time: 09/14/2018  4:22 PM  Result Value Ref Range Status   SARS Coronavirus 2 NEGATIVE NEGATIVE Final    Comment: (NOTE) SARS CoV 2 target nucleic acids are NOT DETECTED. The SARS CoV 2 RNA is generally detectable in upper and lower respiratory specimens during the acute phase of infection. The lowest concentration of SARS CoV 2 viral copies this assay can detect is 250 copies per mL. A negative result does not preclude SARS CoV 2 infection and should not be used as the sole basis for treatment or other patient management decisions. A negative result may occur with improper specimen collection and handling, submission  of specimen other than nasopharyngeal swab, presence of viral mutation(s) within the areas targeted by this assay, and inadequate number of viral copies (less than 250 copies per mL). A negative result must be combined with clinical observations, patient history,  and epidemiological information. The expected result is Negative. Fact Sheet for Patients:   https [employer] media 099833 download Fact Sheet for Healthcare Providers:   https [employer] media 913-442-0225 d Jenel Lucks This test is not yet approved or cleared by the [place] FDA and  has been authorized for detection and/or diagnosis of SARS CoV 2 by FDA under an Emergency Use Authorization (EUA).  This EUA will remain in effect (meaning this test can be used) for the duration of  the COVID19 declaration under Section 564(b)(1) of the Act, 21 U.S.C.  section 360bbb-3(b)(1), unless the authorization is terminated or revoked sooner. Performed at [facility], [address] [place], [place] [address]   MRSA PCR Screening     Status: None   Collection Time: 08/29/2018  8:38 PM   Specimen: Nasal Mucosa; Nasopharyngeal  Result Value Ref Range Status   MRSA by PCR NEGATIVE NEGATIVE Final    Comment:        The  GeneXpert MRSA Assay (FDA approved for NASAL specimens only), is one component of a comprehensive MRSA colonization surveillance program. It is not intended to diagnose MRSA infection nor to guide or monitor treatment for MRSA infections. Performed at [facility], [address] [place], [place] [address]           IMAGING    US Pelvis (transabdominal Only)  Result Date: 09/04/2018 CLINICAL DATA:  Vaginal bleeding EXAM: TRANSABDOMINAL ULTRASOUND OF PELVIS TECHNIQUE: Transabdominal ultrasound examination of the pelvis was performed including evaluation of the uterus, ovaries, adnexal regions, and pelvic cul-de-sac. COMPARISON:  12/27/2008 FINDINGS: Uterus Measurements: 8.1 x 4.3 x 5.9 cm = volume: 109 mL. Left uterine body/fundal fibroid of 3.5 x 3.1 x 3.4 cm. Endometrium Thickness: Normal, 3 mm.  No focal abnormality visualized. Right ovary Obscured by bowel gas. Left ovary Measurements: 2.7 x 1.8 x 2.6 cm = volume: 6.7 mL. Normal appearance/no adnexal mass. Other findings:  Trace free pelvic fluid is likely physiologic. IMPRESSION: 1. Normal appearance of the endometrium. 2. Uterine fundal/body fibroid. 3. Trace free fluid. Electronically Signed   By: Abigail Miyamoto M.D.   On:  09/04/2018 13:01   Dg Chest Port 1 View  Result Date: 09/05/2018 CLINICAL DATA:  Acute respiratory failure EXAM: PORTABLE CHEST 1 VIEW COMPARISON:  Chest radiograph from one day prior. FINDINGS: Endotracheal tube tip is 2.0 cm above the carina. Stable cardiomediastinal silhouette with top-normal heart size. No pneumothorax. No pleural effusion. Extensive patchy consolidation throughout the mid to lower lungs bilaterally, not appreciably changed. IMPRESSION: 1. Well-positioned endotracheal tube. 2. Extensive patchy consolidation throughout the mid to lower lungs bilaterally, not appreciably changed, favor multilobar pneumonia. Electronically Signed   By: Ilona Sorrel M.D.   On: 09/05/2018 07:12   Dg Chest  Port 1 View  Result Date: 09/04/2018 CLINICAL DATA:  Possible aspiration.  Intubation. EXAM: PORTABLE CHEST 1 VIEW COMPARISON:  July 06, 2017 FINDINGS: The ETT terminates 2.2 cm above the carina in good position. No pneumothorax. Bibasilar pulmonary infiltrates, right greater than left. The cardiomediastinal silhouette is stable. IMPRESSION: 1. The ETT is in good position. 2. Bilateral pulmonary infiltrates in the bases, right much greater than left may represent aspiration versus multifocal pneumonia. Recommend clinical correlation and follow-up to resolution. Electronically Signed   By: Dorise Bullion III M.D   On: 09/04/2018 12:58       Indwelling Urinary Catheter continued, requirement due to   Reason to continue Indwelling Urinary Catheter strict Intake/Output monitoring for hemodynamic instability   Central Line/ continued, requirement due to  Reason to continue [place] of central venous pressure or other hemodynamic parameters and poor IV access   Ventilator continued, requirement due to severe respiratory failure   Ventilator Sedation RASS 0 to -2      ASSESSMENT AND PLAN SYNOPSIS 53 yo with severe metabolic encephalopathy from DT's with ETOH liver cirrhosis and renal failure wit progressive encephalopathy and resp failure from aspiration pneumonia   Severe ACUTE Hypoxic and Hypercapnic Respiratory Failure -continue Full MV support -continue Bronchodilator Therapy -Wean Fio2 and PEEP as tolerated  ACUTE KIDNEY INJURY/Renal Failure -follow chem 7 -follow UO -continue Foley Catheter-assess need -Avoid nephrotoxic agents -Recheck creatinine   NEUROLOGY - intubated and sedated - minimal sedation to achieve a RASS goal: -1 Wake up assessment pending   CARDIAC ICU monitoring  ID -continue IV abx as prescibed -follow up cultures  GI-active GIB possibel varicies Continue PPI and octreotide Await GI recs for scope  NUTRITIONAL  STATUS DIET-->NPO Constipation protocol as indicated  ENDO - will use ICU hypoglycemic\Hyperglycemia protocol if indicated   ELECTROLYTES -follow labs as needed -replace as needed -pharmacy consultation and following   DVT/GI PRX ordered TRANSFUSIONS AS NEEDED MONITOR FSBS ASSESS the need for LABS as needed   Critical Care Time devoted to patient care services described in this note is 45 minutes.   Overall, patient is critically ill, prognosis is guarded.  Patient with Multiorgan failure and at high risk for cardiac arrest and death.   Recommend DNR status Prognosis is very poor  Corrin Parker, M.D.  [facility]  Medical Director [place] Director [facility]io[facility] Department

## 2018-09-06 DIAGNOSIS — K922 Gastrointestinal hemorrhage, unspecified: Secondary | ICD-10-CM

## 2018-09-06 LAB — HEPATIC FUNCTION PANEL
ALT: 22 U/L (ref 0–44)
AST: 39 U/L (ref 15–41)
Albumin: 1.8 g/dL — ABNORMAL LOW (ref 3.5–5.0)
Alkaline Phosphatase: 52 U/L (ref 38–126)
Bilirubin, Direct: 8.9 mg/dL — ABNORMAL HIGH (ref 0.0–0.2)
Indirect Bilirubin: 5.5 mg/dL — ABNORMAL HIGH (ref 0.3–0.9)
Total Bilirubin: 14.4 mg/dL — ABNORMAL HIGH (ref 0.3–1.2)
Total Protein: 6.6 g/dL (ref 6.5–8.1)

## 2018-09-06 LAB — CULTURE, BLOOD (ROUTINE X 2)
Culture: NO GROWTH
Culture: NO GROWTH
Special Requests: ADEQUATE

## 2018-09-06 LAB — GLUCOSE, CAPILLARY
Glucose-Capillary: 105 mg/dL — ABNORMAL HIGH (ref 70–99)
Glucose-Capillary: 110 mg/dL — ABNORMAL HIGH (ref 70–99)
Glucose-Capillary: 105 mg/dL — ABNORMAL HIGH (ref 70–99)
Glucose-Capillary: 109 mg/dL — ABNORMAL HIGH (ref 70–99)
Glucose-Capillary: 108 mg/dL — ABNORMAL HIGH (ref 70–99)

## 2018-09-06 LAB — CBC
HCT: 23.8 % — ABNORMAL LOW (ref 36.0–46.0)
Hemoglobin: 7.7 g/dL — ABNORMAL LOW (ref 12.0–15.0)
MCH: 26.8 pg (ref 26.0–34.0)
MCHC: 32.4 g/dL (ref 30.0–36.0)
MCV: 82.9 fL (ref 80.0–100.0)
Platelets: 119 10*3/uL — ABNORMAL LOW (ref 150–400)
RBC: 2.87 MIL/uL — ABNORMAL LOW (ref 3.87–5.11)
RDW: 18.8 % — ABNORMAL HIGH (ref 11.5–15.5)
WBC: 26.3 10*3/uL — ABNORMAL HIGH (ref 4.0–10.5)
nRBC: 3 % — ABNORMAL HIGH (ref 0.0–0.2)

## 2018-09-06 LAB — BASIC METABOLIC PANEL
Anion gap: 6 (ref 5–15)
BUN: 38 mg/dL — ABNORMAL HIGH (ref 6–20)
CO2: 17 mmol/L — ABNORMAL LOW (ref 22–32)
Calcium: 6.7 mg/dL — ABNORMAL LOW (ref 8.9–10.3)
Chloride: 120 mmol/L — ABNORMAL HIGH (ref 98–111)
Creatinine, Ser: 1.23 mg/dL — ABNORMAL HIGH (ref 0.44–1.00)
GFR calc Af Amer: 59 mL/min — ABNORMAL LOW (ref >60)
GFR calc non Af Amer: 51 mL/min — ABNORMAL LOW (ref >60)
Glucose, Bld: 121 mg/dL — ABNORMAL HIGH (ref 70–99)
Potassium: 4 mmol/L (ref 3.5–5.1)
Sodium: 143 mmol/L (ref 135–145)

## 2018-09-06 LAB — HEMOGLOBIN AND HEMATOCRIT, BLOOD
HCT: 24 % — ABNORMAL LOW (ref 36.0–46.0)
Hemoglobin: 7.6 g/dL — ABNORMAL LOW (ref 12.0–15.0)

## 2018-09-06 LAB — PHOSPHORUS: Phosphorus: 4.1 mg/dL (ref 2.5–4.6)

## 2018-09-06 LAB — MAGNESIUM: Magnesium: 2.3 mg/dL (ref 1.7–2.4)

## 2018-09-06 NOTE — Progress Notes (Signed)
Name: Mackenzie Little MRN: [id number] DOB: 10-11-1965     CONSULTATION DATE: 09/04/2018  CHIEF COMPLAINT:  Acute GI bleed.  HISTORY OF PRESENT ILLNESS:  53 year old female with history of HTN, alcohol use disorder, portal HTN, and PUD seen here in the ICU for hypovolemic shock secondary to GI bleed, acute kidney injury, and liver failure secondary to alcoholic hepatitis, with DT and associated encephalopathy. She presented on 8/5 to the ED noting melena x 2 weeks with weakness and single episode of N/V. She was hypotensive with SBP < 80. Initial labs with evidence of acute blood loss and elevated INR, elevated troponins, and evidence of AKI. She received LR, octreotide, vitamin K, and protonix in the ED, and was transferred to the ICU, where a central line was placed (femoral) and vasopressors were started. Remaining hospital course noted below.  SIGNIFICANT EVENTS: -  08/29/2018: ED visit c/o melena and weakness. Hgb low, with hypotension. Started on vit K, octreotide, Protonix and IVF (LR). Brought to ICU. Central line placed. Required vasopressors. Received FFP and 2 units PRBC for coagulopathy and Hgb<7. GI consulted.  - 08/30/2018: EGD postponed, due to critical condition, per GI. Paracentesis planned, not completed due to lack of ascites on abdominal US. RUQ [place] with evidence of cirrhotic liver changes, and contracted, thickened gallbladder wall possibly due to artifact from ascites/contraction, with small gallstones. Continued to require vasopressors. Weaned precedex as tolerated. Restarted Cefepime. 1 U PRBC and FFP administered. - 09/03/2018: Weaning off of pressors with IVF bolus. EGD continued to be postponed. Continued tarry stools. 2 U PRBC. -09/04/2018: Intubated with ventilary support. Remained on precedex and vasopressors. Vaginal bleeding investigated with US pelvis - unremarkable. CXR with bilateral R> L multifocal infiltrates at bases, c/w aspiration vs. Pneumonia.  -09/05/2018: Repeat CXR  with unchanged extensive bil mid/lower lung patchy consolidation c/w multi-lobar pneumonia. GI feels patient still at high risk for endoscopy, and OG tube placement.  OVERNIGHT EVENTS: - Bowel movement black and tarry. CIWA protocol performed last night, patient given Ativan.  Today the patient is still sedated and intubated. She remains on pressors. Unable to perform ROS given patient's intubated/sedatedstate.  PAST MEDICAL HISTORY :   has a past medical history of Hypertension.  has a past surgical history that includes Tooth extraction (May 2016); Esophagogastroduodenoscopy (N/A, 07/07/2017); and Colonoscopy (N/A, 07/07/2017). Prior to Admission medications   Medication Sig Start Date End Date Taking? Authorizing Provider  acetaminophen (TYLENOL) 325 MG tablet Take 650 mg by mouth every 6 (six) hours as needed.   Yes [provider]   No Known Allergies  REVIEW OF SYSTEMS:   Unable to obtain due to critical illness. Patient intubated and sedated.  VITAL SIGNS: Temp:  [98.8 F (37.1 C)-99.2 F (37.3 C)] 99.1 F (37.3 C) (08/10 0500) Pulse Rate:  [75-85] 75 (08/10 0600) Resp:  [7-21] 16 (08/10 0600) BP: (90-118)/(46-66) 104/52 (08/10 0600) SpO2:  [93 %-100 %] 95 % (08/10 0825) FiO2 (%):  [35 %-40 %] 35 % (08/10 0825)  I/O last 3 completed shifts: In: 4341.6 [I.V.:3447.7; IV Piggyback:893.9] Out: 655 [Urine:655] No intake/output data recorded.  SpO2: 95 % O2 Flow Rate (L/min): (S) 15 L/min FiO2 (%): 35 %  Physical Examination:  GENERAL:Critically ill appearing. In respiratory distress, on the ventilator. Not agitated. HEAD: Normocephalic, atraumatic.  EYES: Pupils equal, round, minimally reactive to light.  + scleral icterus.  MOUTH: Moist mucosal membrane.  NECK: Supple. Unable to assess JVD. PULMONARY: + Crackles at bases.  CARDIOVASCULAR: S1 and S2. Regular rate and rhythm. No murmurs, rubs, or gallops.  GASTROINTESTINAL: Nontender, mildly distended. RUQ and  epigastric region fullness. No masses. Positive bowel sounds. No hepatosplenomegaly.  EXTREMITIES:   LOWER: No evidence of edema. 1 + DP pulses. Capillary refill <3 seconds. UPPER: 2+ radial pulses.  NEUROLOGIC: Obtunded. + gag reflex. Minimal corneal reflex. Unable to follow commands. No response to painful stimuli.  SKIN: intact, warm, dry.  I personally reviewed lab work that was obtained in last 24 hrs.  MEDICATIONS: I have reviewed all medications and confirmed regimen as documented  CBC    Component Value Date/Time   WBC 26.3 (H) 09/06/2018 1142   RBC 2.87 (L) 09/06/2018 1142   HGB 7.7 (L) 09/06/2018 1142   HCT 23.8 (L) 09/06/2018 1142   PLT 119 (L) 09/06/2018 1142   MCV 82.9 09/06/2018 1142   MCH 26.8 09/06/2018 1142   MCHC 32.4 09/06/2018 1142   RDW 18.8 (H) 09/06/2018 1142   LYMPHSABS 2.4 08/31/2018 1315   MONOABS 0.3 09/03/2018 1315   EOSABS 0.0 09/19/2018 1315   BASOSABS 0.0 08/31/2018 1315   CMP Latest Ref Rng & Units 09/06/2018 09/05/2018 09/05/2018  Glucose 70 - 99 mg/dL 121(H) 125(H) 108(H)  BUN 6 - 20 mg/dL 38(H) 37(H) 34(H)  Creatinine 0.44 - 1.00 mg/dL 1.23(H) UNABLE TO REPORT DUE TO ICTERIC INTERFERENCE SDR 1.16(H)  Sodium 135 - 145 mmol/L 143 143 144  Potassium 3.5 - 5.1 mmol/L 4.0 4.2 4.3  Chloride 98 - 111 mmol/L 120(H) 118(H) 119(H)  CO2 22 - 32 mmol/L 17(L) 15(L) 16(L)  Calcium 8.9 - 10.3 mg/dL 6.7(L) 6.8(L) 6.8(L)  Total Protein 6.5 - 8.1 g/dL 6.6 6.5 -  Total Bilirubin 0.3 - 1.2 mg/dL 14.4(H) 15.0(H) -  Alkaline Phos 38 - 126 U/L 52 38 -  AST 15 - 41 U/L 39 50(H) -  ALT 0 - 44 U/L 22 UNABLE TO REPORT DUE TO ICTERIC INTERFERENCE SDR -   CULTURE RESULTS   Recent Results (from the past 240 hour(s))  Culture, blood (routine x 2)     Status: None   Collection Time: 09/08/2018  1:25 PM   Specimen: BLOOD  Result Value Ref Range Status   Specimen Description BLOOD LEFT ANTECUBITAL  Final   Special Requests   Final    BOTTLES DRAWN AEROBIC AND ANAEROBIC  Blood Culture adequate volume   Culture   Final    NO GROWTH 5 DAYS Performed at [facility], [address] [place], [place] [address]    Report Status 09/06/2018 FINAL  Final  Culture, blood (routine x 2)     Status: None   Collection Time: 09/04/2018  1:39 PM   Specimen: BLOOD  Result Value Ref Range Status   Specimen Description BLOOD RIGHT ANTECUBITAL  Final   Special Requests   Final    BOTTLES DRAWN AEROBIC AND ANAEROBIC Blood Culture results may not be optimal due to an excessive volume of blood received in culture bottles   Culture   Final    NO GROWTH 5 DAYS Performed at [facility], [address] [place], [place] [address]    Report Status 09/06/2018 FINAL  Final  SARS Coronavirus 2 [facility] order, Performed in [place] hospital lab)     Status: None   Collection Time: 09/18/2018  4:22 PM  Result Value Ref Range Status   SARS Coronavirus 2 NEGATIVE NEGATIVE Final    Comment: (NOTE) SARS CoV 2 target nucleic acids  are NOT DETECTED. The SARS CoV 2 RNA is generally detectable in upper and lower respiratory specimens during the acute phase of infection. The lowest concentration of SARS CoV 2 viral copies this assay can detect is 250 copies per mL. A negative result does not preclude SARS CoV 2 infection and should not be used as the sole basis for treatment or other patient management decisions. A negative result may occur with improper specimen collection and handling, submission  of specimen other than nasopharyngeal swab, presence of viral mutation(s) within the areas targeted by this assay, and inadequate number of viral copies (less than 250 copies per mL). A negative result must be combined with clinical observations, patient history,  and epidemiological information. The expected result is Negative. Fact Sheet for Patients:   https [employer] 735789 download Fact Sheet for Healthcare Providers:   https [employer] (407) 417-0501 d  Jenel Lucks This test is not yet approved or cleared by the [place] FDA and  has been authorized for detection and/or diagnosis of SARS CoV 2 by FDA under an Emergency Use Authorization (EUA).  This EUA will remain in effect (meaning this test can be used) for the duration of  the COVID19 declaration under Section 564(b)(1) of the Act, 21 U.S.C.  section 360bbb-3(b)(1), unless the authorization is terminated or revoked sooner. Performed at [facility], [address] [place], [place] [address]   MRSA PCR Screening     Status: None   Collection Time: 09/19/2018  8:38 PM   Specimen: Nasal Mucosa; Nasopharyngeal  Result Value Ref Range Status   MRSA by PCR NEGATIVE NEGATIVE Final    Comment:        The GeneXpert MRSA Assay (FDA approved for NASAL specimens only), is one component of a comprehensive MRSA colonization surveillance program. It is not intended to diagnose MRSA infection nor to guide or monitor treatment for MRSA infections. Performed at [facility], [address] [place], [place] [address]   Culture, blood (Routine X 2) w Reflex to ID Panel     Status: None (Preliminary result)   Collection Time: 09/05/18  6:52 AM   Specimen: BLOOD  Result Value Ref Range Status   Specimen Description BLOOD L HAND  Final   Special Requests   Final    BOTTLES DRAWN AEROBIC AND ANAEROBIC Blood Culture adequate volume   Culture   Final    NO GROWTH < 24 HOURS Performed at [facility], [address] [place], [place] [address]    Report Status PENDING  Incomplete  Culture, blood (Routine X 2) w Reflex to ID Panel     Status: None (Preliminary result)   Collection Time: 09/05/18  7:04 AM   Specimen: BLOOD  Result Value Ref Range Status   Specimen Description BLOOD L FOREARM  Final   Special Requests   Final    BOTTLES DRAWN AEROBIC AND ANAEROBIC Blood Culture adequate volume   Culture   Final    NO GROWTH < 24 HOURS Performed at [facility], [address], [place], [place] [address]    Report Status PENDING  Incomplete  Culture, respiratory (non-expectorated)     Status: None (Preliminary result)   Collection Time: 09/05/18 11:42 AM   Specimen: Tracheal Aspirate; Respiratory  Result Value Ref Range Status   Specimen Description   Final    TRACHEAL ASPIRATE Performed at [facility], [address], [place], [place] [address]    Special Requests  Final    NONE Performed at [facility], [place], [place] [address]    Gram Stain   Final    FEW WBC PRESENT,BOTH PMN AND MONONUCLEAR RARE YEAST RARE GRAM POSITIVE COCCI NO SQUAMOUS EPITHELIAL CELLS PRESENT    Culture   Final    CULTURE REINCUBATED FOR BETTER GROWTH Performed at [facility], [place] [address], [place], [place] [address]    Report Status PENDING  Incomplete    IMAGING    No results found.    CXR 09/05/2018: Independently reviewed  Indwelling Urinary Catheter continued, requirement due to   Reason to continue Indwelling Urinary Catheter strict Intake/Output monitoring for hemodynamic instability   Central Line/ continued, requirement due to  Reason to continue [place] of central venous pressure or other hemodynamic parameters and poor IV access   Ventilator continued, requirement due to severe respiratory failure   Ventilator Sedation RASS 0 to -2   ASSESSMENT AND PLAN SYNOPSIS: 53 year old female with history as stated above with hypovolemic shock secondary to GI bleed, acute kidney injury, and liver failure secondary to alcoholic hepatitis, with DT and associated encephalopathy. Patient's prognosis remains guarded, with evidence of end organ damage in her liver and kidneys, and now requiring intubation for respiratory failure.   Severe ACUTE Hypoxic and Hypercapnic Respiratory Failure  - Continue Full MV support - Wean Fio2 and PEEP as tolerated - Will perform SAT/SBT when  respiratory parameters are met  SHOCK-SEPSIS/HYPOVOLUMIC - secondary to GI bleed and infectious process. - use vasopressors to keep MAP>65, wean as tolerated. - Follow up blood cultures, no growth after 24 hours. - emperic ABX - Cefepime and Flagyl. - consider stress dose steroids. - Aggressive IV fluid resuscitation  MULTILOBAR PNEUMONIA - suspected aspiration. - No interval improvement in consolidations on CXR. Sputum culture w/ rare yeast, and rare gram + cocci. - Will continue with IV cefepime, as prescribed.  ACUTE KIDNEY INJURY/Renal Failure -follow chem 7 -follow UO -continue Foley Catheter-assess need -Avoid nephrotoxic agents  LIVER DYSFUNCTION - Elevated LFTs, icteric sclera, in the setting of alcoholic use disorder. - Will administer IV albumin - Repeat hepatic function panel drawn and pending.  ACUTE GI BLEED - GI following. - Will monitor H/H q 12 hours, transfuse for Hgb < 7, or Plts < 50,000. - Possible varices. Continue Flagyl, Octreotide and Protonix. - Plan for endoscopy for acute GI bleed when more stable, per GI.  NEUROLOGY - intubated and sedated - minimal sedation to achieve a RASS goal: -1 - Continue CIWA protocol. - We will wean down the Fentanyl and increase Precedex dosage, given continued agitation, and to prevent respiratory depression.  CARDIAC ICU monitoring  GI GI PROPHYLAXIS as indicated  NUTRITIONAL STATUS DIET--> NPO (s/p 3 days since intubation). Plans to place OG tube. Constipation protocol as indicated  ENDO - will use ICU hypoglycemic\Hyperglycemia protocol if needed  ELECTROLYTES - Will replace Calcium. -follow labs as needed -replace as needed -pharmacy consultation and following  DVT/GI PRX ordered- SCDs and Pantoprazole. TRANSFUSIONS AS NEEDED MONITOR FSBS ASSESS the need for LABS   Critical Care Time devoted to patient care services described in this note is _____ minutes.   Overall, patient is critically ill,  prognosis is guarded.  Patient with Multiorgan failure and at high risk for cardiac arrest and death.

## 2018-09-06 NOTE — Progress Notes (Signed)
[facility] [place] NAME: Mackenzie Little    MR#:  [id number]  DATE OF BIRTH:  Dec 10, 1965  SUBJECTIVE:  CHIEF COMPLAINT:   Chief Complaint  Patient presents with  . Constipation  . Chest Pain  Patient seen and evaluated today Has been intubated put on ventilator by ICU attending Currently on IV pressor medication to support blood pressure On mechanical ventilator Tidal volume 500 Rate 16 FiO2 35% PEEP 5 Currently n.p.o.  REVIEW OF SYSTEMS:    ROS  On mechanical ventilator  DRUG ALLERGIES:  No Known Allergies  VITALS:  Blood pressure (!) 104/52, pulse 75, temperature 99.1 F (37.3 C), temperature source Axillary, resp. rate 16, height 5\' 6"  (1.676 m), weight 75.4 kg, SpO2 95 %.  PHYSICAL EXAMINATION:   Physical Exam  GENERAL:  53 y.o.-year-old patient lying in the bed on ventilator EYES: Pupils equal, round, reactive to light and accommodation. Has scleral icterus. Extraocular muscles intact.  HEENT: Head atraumatic, normocephalic. Oropharynx endotracheal tube noted NECK:  Supple, no jugular venous distention. No thyroid enlargement, no tenderness.  LUNGS: Improved breath sounds bilaterally, scattered rales in both lungs.  On ventilator CARDIOVASCULAR: S1, S2 normal. No murmurs, rubs, or gallops.  ABDOMEN: Soft, nontender, nondistended. Bowel sounds present. No organomegaly or mass.  EXTREMITIES: No cyanosis, clubbing or edema b/l.    NEUROLOGIC: Complete nervous system exam was not possible as patient is on ventilator PSYCHIATRIC: The patient is alert and oriented x none.  SKIN: No obvious rash, lesion, or ulcer.   LABORATORY PANEL:   CBC Recent Labs  Lab 09/05/18 0534  WBC 17.4*  HGB 8.2*  HCT 24.7*  PLT 113*   ------------------------------------------------------------------------------------------------------------------ Chemistries  Recent Labs  Lab 09/05/18 1337 09/06/18 0356  NA 143 143  K 4.2 4.0   CL 118* 120*  CO2 15* 17*  GLUCOSE 125* 121*  BUN 37* 38*  CREATININE UNABLE TO REPORT DUE TO ICTERIC INTERFERENCE SDR 1.23*  CALCIUM 6.8* 6.7*  MG  --  2.3  AST 50*  --   ALT  UNABLE TO REPORT DUE TO ICTERIC INTERFERENCE SDR  --   ALKPHOS 38  --   BILITOT 15.0*  --    ------------------------------------------------------------------------------------------------------------------  Cardiac Enzymes No results for input(s): TROPONINI in the last 168 hours. ------------------------------------------------------------------------------------------------------------------  RADIOLOGY:  Dg Chest Port 1 View  Result Date: 09/05/2018 CLINICAL DATA:  Acute respiratory failure EXAM: PORTABLE CHEST 1 VIEW COMPARISON:  Chest radiograph from one day prior. FINDINGS: Endotracheal tube tip is 2.0 cm above the carina. Stable cardiomediastinal silhouette with top-normal heart size. No pneumothorax. No pleural effusion. Extensive patchy consolidation throughout the mid to lower lungs bilaterally, not appreciably changed. IMPRESSION: 1. Well-positioned endotracheal tube. 2. Extensive patchy consolidation throughout the mid to lower lungs bilaterally, not appreciably changed, favor multilobar pneumonia. Electronically Signed   By: Ilona Sorrel M.D.   On: 09/05/2018 07:12   Dg Chest Port 1 View  Result Date: 09/04/2018 CLINICAL DATA:  Possible aspiration.  Intubation. EXAM: PORTABLE CHEST 1 VIEW COMPARISON:  July 06, 2017 FINDINGS: The ETT terminates 2.2 cm above the carina in good position. No pneumothorax. Bibasilar pulmonary infiltrates, right greater than left. The cardiomediastinal silhouette is stable. IMPRESSION: 1. The ETT is in good position. 2. Bilateral pulmonary infiltrates in the bases, right much greater than left may represent aspiration versus multifocal pneumonia. Recommend clinical correlation and follow-up to resolution. Electronically Signed   By: Dorise Bullion III M.D   On:  09/04/2018  12:58     ASSESSMENT AND PLAN:   53 year old female patientwith a known history ofhypertension, alcohol abuse presented to the emergency room for generalized weakness.Patient has been fatigued for the last 3 days and noticed dark tarry stool for the last couple of days.   Patient was tired and fatigued and had labored breathing this morning and has been electively intubated and put on ventilator by intensivist.  -Hypovolemic shock and multiorgan failure -due to active GI bleed -  Initial labs with evidence of acute blood loss (Hgb 6.5), and elevated INR, elevated troponins, and evidence of AKI (Cr 2.47, GFR 25) - on vasopressors to maintain MAP >65. Status post 2U PRBC and FFP last night.   -Acute respiratory failure with hypoxia Mechanical ventilator to continue Vent bundle Intensivist follow-up  -Acute gastrointestinal bleeding -on octreotide and Protonix drip - melena x 2 weeks, received LR, octreotide, vitamin K, and protonix  - GI is following the patient, endoscopy as per GI whenever patient is clinically stable  -Abnormal liver function tests Repeat LFTs Avoid hepatotoxic drugs Likely cholestasis from acute infection  -Acute symptomatic severe anemia - status post transfuse 2 units PRBC   -Acute kidney injury: Likely due to ATN due to hypotension Avoid nephrotoxic medications IV fluids and monitor renal function  -Elevated lactic acid -likely due to multiorgan failure and hypotension/dehydration Follow-up levels Empiric broad spectrum antibiotics  -Elevated troponins  -Likely from demand ischemia  -Acute hypokalemia Potassium to be replaced aggressively  -Long-term prognosis poor  All the records are reviewed and case discussed with Care Management/Social Worker. Management plans discussed with the patient, family and they are in agreement.  CODE STATUS: Full code  DVT Prophylaxis: SCDs  TOTAL TIME TAKING CARE OF THIS PATIENT: 35 minutes.    POSSIBLE D/C IN 4 to 5 DAYS, DEPENDING ON CLINICAL CONDITION.  Saundra Shelling M.D on 09/06/2018 at 11:39 AM  Between 7am to 6pm - Pager - [phone]  After 6pm go to [email or web address] - password [email or web address] [facility]  Office  [phone]  CC: Primary care physician; Ranae Plumber, PA  Note: This dictation was prepared with Dragon dictation along with smaller phrase technology. Any transcriptional errors that result from this process are unintentional.

## 2018-09-06 NOTE — Progress Notes (Signed)
Initial Nutrition Assessment  DOCUMENTATION CODES:   Not applicable  INTERVENTION:  Plan is to consider initiation of TPN on 8/12 if patient remains inappropriate for enteral nutrition. Plan is for placement of PICC if TPN is initiated.  If TPN is initiated, recommend initiating Clinimix E 5/15 at 40 mL/hr x 24 hours + 20% ILE at 15 mL/hr x 12 hours. After 24 hours if electrolytes, CBGs, and triglycerides are within acceptable range can advance to goal regimen of Clinimix E 5/15 at 75 mL/hr x 24 hours + 20% ILE at 15 mL/hr x 12 hours. Provides 1638 kcal, 90 grams of protein, 1800 mL fluid from Clinimix, and 180 mL from lipids daily.  If TPN is initiated provide adult MVI daily in TPN, trace elements M/W/F in TPN due to shortage, thiamine 100 mg daily in TPN, and folic acid 1 mg daily in TPN.  Also recommend measuring daily weights.  NUTRITION DIAGNOSIS:   Inadequate oral intake related to inability to eat as evidenced by NPO status.  GOAL:   Provide needs based on ASPEN/SCCM guidelines  MONITOR:   Vent status, Labs, Weight trends, I & O's  REASON FOR ASSESSMENT:   Ventilator    ASSESSMENT:   53 year old female with PMHx of HTN, EtOH abuse, portal HTN, PUD admitted with hypovolemic/septic shock secondary to acute GI bleed, likely alcoholic hepatitis, with AKI, and evidence of DTs, required intubation on 8/8.   Patient intubated and sedated. On PRVC mode with FiO2 35% and PEEP 5 cmH2O. Abdomen distended per RN documentation. She had a medium type 6 BM on 8/9. Per chart patient was 82.4 kg on 07/06/2017. She is now 75.4 kg (166.23 lbs). Patient is now on day 5 of admission with no nutrition. She is unable to have enteral access at this time and even if she had access, she is too unstable for enteral nutrition at this time. Patient has been too unstable for EGD. Plan is to consider TPN on 8/12. As that will be day 7 of ICU admission, lipids can be initiated at time of TPN  initiation.  IV Access: right femoral CVC triple lumen  Enteral Access: no enteral access at this time  MAP: 62-74 mmHg  Patient is currently intubated on ventilator support Ve: 8.4 L/min Temp (24hrs), Avg:98.8 F (37.1 C), Min:98.3 F (36.8 C), Max:99.1 F (37.3 C)  Propofol: N/A  Medications reviewed and include: folic acid 1 mg daily IV, pantoprazole, thiamine 100 mg daily IV, cefepime, D10 at 30 mL/hr, fentanyl gtt, Flagyl, norepinephrine gtt at 32 mcg/min, octreotide gtt, vasopressin gtt at 0.03 units/min.  Labs reviewed: CBG 105-118, Chloride 120, CO2 17, BUN 38, Creatinine 1.23.  I/O: 305 mL UOP yesterday (0.2 mL/kg/hr)  Discussed with RN and on rounds. Plan is to consider initiation of TPN on 8/12 if patient remains inappropriate for enteral nutrition at that time. Plan is to order PICC line.  NUTRITION - FOCUSED PHYSICAL EXAM:    Most Recent Value  Orbital Region  No depletion  Upper Arm Region  No depletion  Thoracic and Lumbar Region  No depletion  Buccal Region  Unable to assess  Temple Region  No depletion  Clavicle Bone Region  No depletion  Clavicle and Acromion Bone Region  No depletion  Scapular Bone Region  Unable to assess  Dorsal Hand  No depletion  Patellar Region  No depletion  Anterior Thigh Region  No depletion  Posterior Calf Region  Moderate depletion  Edema (RD Assessment)  Mild  Hair  Reviewed  Eyes  Unable to assess  Mouth  Unable to assess  Skin  Reviewed  Nails  Reviewed     Diet Order:   Diet Order            Diet NPO time specified  Diet effective now             EDUCATION NEEDS:   No education needs have been identified at this time  Skin:  Skin Assessment: Reviewed RN Assessment  Last BM:  09/05/2018 - medium type 6  Height:   Ht Readings from Last 1 Encounters:  09/10/2018 5\' 6"  (1.676 m)   Weight:   Wt Readings from Last 1 Encounters:  09/19/2018 75.4 kg   Ideal Body Weight:  59.1 kg  BMI:  Body mass index is  26.83 kg/m.  Estimated Nutritional Needs:   Kcal:  1611 (PSU 2003b w/ MSJ 1389, Ve 8.4, Tmax 37.3)  Protein:  90-113 grams (1.2-1.5 grams/kg)  Fluid:  1.9 L/day  Willey Blade, MS, RD, LDN Office: [phone] Pager: [phone] After Hours/Weekend Pager: [phone]

## 2018-09-06 NOTE — H&P (View-Only) (Signed)
GI Inpatient Follow-up Note  Subjective:  Patient seen and examined resting in hospital bed. Events from the weekend were reviewed in the chart. Patient has been intubated and put on a ventilator by the ICU attending. She is currently on IV pressors to support her blood pressure. Per nursing, bowel movement last night was black and tarry. CIWA protocol was performed and patient was given ativan. Per nursing, patient had two BMs last night which were black and tarry. One bowel movement this morning was also black and tarry. She is responding to verbal and pain stimuli, but not able to follow commands.   Scheduled Inpatient Medications:  . chlorhexidine gluconate (MEDLINE KIT)  15 mL Mouth Rinse BID  . Chlorhexidine Gluconate Cloth  6 each Topical QHS  . folic acid  1 mg Intravenous Daily  . mouth rinse  15 mL Mouth Rinse 10 times per day  . pantoprazole (PROTONIX) IV  40 mg Intravenous Once  . pantoprazole  40 mg Intravenous Q12H  . sodium chloride flush  10-40 mL Intracatheter Q12H  . thiamine injection  100 mg Intravenous Daily    Continuous Inpatient Infusions:   . sodium chloride 250 mL (09/04/18 0904)  . ceFEPime (MAXIPIME) IV Stopped (09/06/18 1337)  . dexmedetomidine (PRECEDEX) IV infusion Stopped (09/04/18 1111)  . dextrose 30 mL/hr at 09/06/18 1600  . fentaNYL infusion INTRAVENOUS 100 mcg/hr (09/06/18 1600)  . metronidazole Stopped (09/06/18 1246)  . norepinephrine (LEVOPHED) Adult infusion 32 mcg/min (09/06/18 1600)  . octreotide  (SANDOSTATIN)    IV infusion 50 mcg/hr (09/06/18 1600)  . vasopressin (PITRESSIN) infusion - *FOR SHOCK* 0.03 Units/min (09/06/18 1600)    PRN Inpatient Medications:  sodium chloride, acetaminophen **OR** acetaminophen, LORazepam, ondansetron **OR** ondansetron (ZOFRAN) IV, sodium chloride flush, traMADol  Review of Systems:  Unable to obtain due to patient's mental status.    Physical Examination: BP (!) 107/58 (BP Location: Left Arm)    Pulse 80   Temp 99 F (37.2 C) (Oral)   Resp 16   Ht _0  (1.676 m)   Wt 75.4 kg   SpO2 96%   BMI 26.83 kg/m   Critically ill appearing female in hospital bed, intubated and on vent. No agitation present.  HEENT: PEERLA, EOMI, +scleral icterus Neck: supple, no JVD or thyromegaly Chest: CTA bilaterally, no wheezes, crackles, or other adventitious sounds CV: RRR, no m/g/c/r Abd: mildly distended, hypoactive bowel sounds, RUQ and epigastric region fullness, nontender to palpation, No masses. Positive bowel sounds. No hepatosplenomegaly.  Ext: no edema, well perfused with 2+ pulses, Skin: no rash or lesions noted Lymph: no LAD  Data: Lab Results  Component Value Date   WBC 26.3 (H) 09/06/2018   HGB 7.7 (L) 09/06/2018   HCT 23.8 (L) 09/06/2018   MCV 82.9 09/06/2018   PLT 119 (L) 09/06/2018   Recent Labs  Lab 09/04/18 0400 09/05/18 0534 09/06/18 1142  HGB 8.1* 8.2* 7.7*   Lab Results  Component Value Date   NA 143 09/06/2018   K 4.0 09/06/2018   CL 120 (H) 09/06/2018   CO2 17 (L) 09/06/2018   BUN 38 (H) 09/06/2018   CREATININE 1.23 (H) 09/06/2018   Lab Results  Component Value Date   ALT 22 09/06/2018   AST 39 09/06/2018   ALKPHOS 52 09/06/2018   BILITOT 14.4 (H) 09/06/2018   Recent Labs  Lab 09/04/18 0400  INR 1.7*   Assessment/Plan:  53 y/o AA female admitted for generalized weakness, melena x 2 weeks  1. Severe acute hypoxic and hypercapnic respiratory failure 2. Multilobar pneumonia - suspected 2/2 aspiration, on IV cefepime 3. Alcoholic hepatitis  4. Decompensated alcoholic cirrhosis - labs today show total bilirubin 14.4. Likely 2/2 cholestasis from acute illness and infection   - No further episodes of active GI bleeding. One episode of melena overnight per nursing - Hemoglobin 7.7. Continue to monitor H&H. Transfuse for Hgb <7.0.  - Patient remains a high risk for endoscopy given her clinical status and multilobar pneumonia. Consider endoscopy if  she develops recurrent melena as pt is already intubated and sedated - Continue IV Protonix and Octreotide - Please call GI on call immediately if there are any signs of active GI bleeding - D/C octreotide after Wednesday if EGD has not been done - Continue Pentoxyfylline for at least 28 days - Consider MRCP if liver enzymes continue to worsen - Plan for EGD in the next 24-48 hours, when clinically stable or if clinical status worsens - Recheck INR in the AM. Needs to be <1.7 ideally - We will follow along and reassess tomorrow    Please call with questions or concerns.    Octavia Bruckner, PA-C [facility] [phone] (Cell)

## 2018-09-06 NOTE — Progress Notes (Signed)
PHARMACY CONSULT NOTE - FOLLOW UP  Pharmacy Consult for Electrolyte Monitoring and Replacement   Recent Labs: Potassium (mmol/L)  Date Value  09/06/2018 4.0   Magnesium (mg/dL)  Date Value  09/06/2018 2.3   Calcium (mg/dL)  Date Value  09/06/2018 6.7 (L)   Albumin (g/dL)  Date Value  09/06/2018 1.8 (L)   Phosphorus (mg/dL)  Date Value  09/06/2018 4.1   Sodium (mmol/L)  Date Value  09/06/2018 143   Corrected Calcium: 8.4   Assessment: 53 year old female admitted with GI bleed, multiple organ failure. History of alcohol use PTA. Pharmacy consulted to replace electrolytes.  Goal of Therapy:  Electrolytes WNL   Plan:  No additional supplementation needed.   F/U all electrolytes with AM labs.   Pharmacy to continue to follow and replace electrolytes as indicated.  Paulina Fusi, PharmD, BCPS 09/06/2018 3:34 PM

## 2018-09-06 NOTE — Progress Notes (Addendum)
GI Inpatient Follow-up Note  Subjective:  Patient seen and examined resting in hospital bed. Events from the weekend were reviewed in the chart. Patient has been intubated and put on a ventilator by the ICU attending. She is currently on IV pressors to support her blood pressure. Per nursing, bowel movement last night was black and tarry. CIWA protocol was performed and patient was given ativan. Per nursing, patient had two BMs last night which were black and tarry. One bowel movement this morning was also black and tarry. She is responding to verbal and pain stimuli, but not able to follow commands.   Scheduled Inpatient Medications:  . chlorhexidine gluconate (MEDLINE KIT)  15 mL Mouth Rinse BID  . Chlorhexidine Gluconate Cloth  6 each Topical QHS  . folic acid  1 mg Intravenous Daily  . mouth rinse  15 mL Mouth Rinse 10 times per day  . pantoprazole (PROTONIX) IV  40 mg Intravenous Once  . pantoprazole  40 mg Intravenous Q12H  . sodium chloride flush  10-40 mL Intracatheter Q12H  . thiamine injection  100 mg Intravenous Daily    Continuous Inpatient Infusions:   . sodium chloride 250 mL (09/04/18 0904)  . ceFEPime (MAXIPIME) IV Stopped (09/06/18 1337)  . dexmedetomidine (PRECEDEX) IV infusion Stopped (09/04/18 1111)  . dextrose 30 mL/hr at 09/06/18 1600  . fentaNYL infusion INTRAVENOUS 100 mcg/hr (09/06/18 1600)  . metronidazole Stopped (09/06/18 1246)  . norepinephrine (LEVOPHED) Adult infusion 32 mcg/min (09/06/18 1600)  . octreotide  (SANDOSTATIN)    IV infusion 50 mcg/hr (09/06/18 1600)  . vasopressin (PITRESSIN) infusion - *FOR SHOCK* 0.03 Units/min (09/06/18 1600)    PRN Inpatient Medications:  sodium chloride, acetaminophen **OR** acetaminophen, LORazepam, ondansetron **OR** ondansetron (ZOFRAN) IV, sodium chloride flush, traMADol  Review of Systems:  Unable to obtain due to patient's mental status.    Physical Examination: BP (!) 107/58 (BP Location: Left Arm)    Pulse 80   Temp 99 F (37.2 C) (Oral)   Resp 16   Ht _0  (1.676 m)   Wt 75.4 kg   SpO2 96%   BMI 26.83 kg/m   Critically ill appearing female in hospital bed, intubated and on vent. No agitation present.  HEENT: PEERLA, EOMI, +scleral icterus Neck: supple, no JVD or thyromegaly Chest: CTA bilaterally, no wheezes, crackles, or other adventitious sounds CV: RRR, no m/g/c/r Abd: mildly distended, hypoactive bowel sounds, RUQ and epigastric region fullness, nontender to palpation, No masses. Positive bowel sounds. No hepatosplenomegaly.  Ext: no edema, well perfused with 2+ pulses, Skin: no rash or lesions noted Lymph: no LAD  Data: Lab Results  Component Value Date   WBC 26.3 (H) 09/06/2018   HGB 7.7 (L) 09/06/2018   HCT 23.8 (L) 09/06/2018   MCV 82.9 09/06/2018   PLT 119 (L) 09/06/2018   Recent Labs  Lab 09/04/18 0400 09/05/18 0534 09/06/18 1142  HGB 8.1* 8.2* 7.7*   Lab Results  Component Value Date   NA 143 09/06/2018   K 4.0 09/06/2018   CL 120 (H) 09/06/2018   CO2 17 (L) 09/06/2018   BUN 38 (H) 09/06/2018   CREATININE 1.23 (H) 09/06/2018   Lab Results  Component Value Date   ALT 22 09/06/2018   AST 39 09/06/2018   ALKPHOS 52 09/06/2018   BILITOT 14.4 (H) 09/06/2018   Recent Labs  Lab 09/04/18 0400  INR 1.7*   Assessment/Plan:  53 y/o AA female admitted for generalized weakness, melena x 2 weeks  1. Severe acute hypoxic and hypercapnic respiratory failure 2. Multilobar pneumonia - suspected 2/2 aspiration, on IV cefepime 3. Alcoholic hepatitis  4. Decompensated alcoholic cirrhosis - labs today show total bilirubin 14.4. Likely 2/2 cholestasis from acute illness and infection   - No further episodes of active GI bleeding. One episode of melena overnight per nursing - Hemoglobin 7.7. Continue to monitor H&H. Transfuse for Hgb <7.0.  - Patient remains a high risk for endoscopy given her clinical status and multilobar pneumonia. Consider endoscopy if  she develops recurrent melena as pt is already intubated and sedated - Continue IV Protonix and Octreotide - Please call GI on call immediately if there are any signs of active GI bleeding - D/C octreotide after Wednesday if EGD has not been done - Continue Pentoxyfylline for at least 28 days - Consider MRCP if liver enzymes continue to worsen - Plan for EGD in the next 24-48 hours, when clinically stable or if clinical status worsens - Recheck INR in the AM. Needs to be <1.7 ideally - We will follow along and reassess tomorrow    Please call with questions or concerns.    Octavia Bruckner, PA-C [facility] [phone] (Cell)

## 2018-09-06 NOTE — Consult Note (Signed)
Pharmacy Antibiotic Note  Mackenzie Little is a 53 y.o. female admitted on 09/19/2018 with sepsis.  Pharmacy has been consulted for cefepime and vancomycin dosing.  Vancmycin d/c 09/10/2018. Being intubated 8/8  Plan: Day 6 - Cefepime 2 g q8h  Day 5 - metronidazole 500mg  IV q8h    Height: 5\' 6"  (167.6 cm) Weight: 166 lb 3.6 oz (75.4 kg) IBW/kg (Calculated) : 59.3Will give vancomycin 1750 mg x 1 dose and order a 24 hour level. Renal function is not stable, will dose by levels.   Temp (24hrs), Avg:98.8 F (37.1 C), Min:98.3 F (36.8 C), Max:99.1 F (37.3 C)  Recent Labs  Lab 08/29/2018 2116 09/17/2018 2328 09/20/2018 0130 09/20/2018 0455 09/15/2018 0827 09/03/18 0500 09/04/18 0400 09/05/18 0534 09/05/18 1337 09/06/18 0356 09/06/18 1142  WBC  --   --   --  20.1*  --  8.4 10.6* 17.4*  --   --  26.3*  CREATININE  --   --   --  3.00*  --  1.22* 0.62 1.16* UNABLE TO REPORT DUE TO ICTERIC INTERFERENCE SDR 1.23*  --   LATICACIDVEN 8.5* 8.2* 6.6* 3.7* 2.0*  --   --   --   --   --   --     Estimated Creatinine Clearance: 56.1 mL/min (A) (by C-G formula based on SCr of 1.23 mg/dL (H)).    No Known Allergies  Antimicrobials this admission: 8/5 cefepime >>  8/5 vancomycin x 1 dose  8/6 metronidazole >>  Dose adjustments this admission: None  Microbiology results: 8/5 BCx: NG x 3d  Thank you for allowing pharmacy to be a part of this patient's care.  Paulina Fusi, PharmD, BCPS 09/06/2018 3:41 PM

## 2018-09-07 ENCOUNTER — Inpatient Hospital Stay
Admit: 2018-09-07 | Discharge: 2018-09-07 | Disposition: A | Discharge status: Home / Self Care | Payer: Medicaid Other | Attending: Pulmonary Disease | Admitting: Pulmonary Disease

## 2018-09-07 ENCOUNTER — Inpatient Hospital Stay: Payer: Medicaid Other

## 2018-09-07 LAB — BASIC METABOLIC PANEL
Anion gap: 8 (ref 5–15)
BUN: 39 mg/dL — ABNORMAL HIGH (ref 6–20)
CO2: 15 mmol/L — ABNORMAL LOW (ref 22–32)
Calcium: 7 mg/dL — ABNORMAL LOW (ref 8.9–10.3)
Chloride: 120 mmol/L — ABNORMAL HIGH (ref 98–111)
Creatinine, Ser: 1.2 mg/dL — ABNORMAL HIGH (ref 0.44–1.00)
GFR calc Af Amer: >60 mL/min (ref >60)
GFR calc non Af Amer: 52 mL/min — ABNORMAL LOW (ref >60)
Glucose, Bld: 105 mg/dL — ABNORMAL HIGH (ref 70–99)
Potassium: 3.8 mmol/L (ref 3.5–5.1)
Sodium: 143 mmol/L (ref 135–145)

## 2018-09-07 LAB — HEMOGLOBIN AND HEMATOCRIT, BLOOD
HCT: 22.6 % — ABNORMAL LOW (ref 36.0–46.0)
HCT: 26.5 % — ABNORMAL LOW (ref 36.0–46.0)
Hemoglobin: 7.1 g/dL — ABNORMAL LOW (ref 12.0–15.0)
Hemoglobin: 8.4 g/dL — ABNORMAL LOW (ref 12.0–15.0)

## 2018-09-07 LAB — CBC WITH DIFFERENTIAL/PLATELET
Abs Immature Granulocytes: 0.61 10*3/uL — ABNORMAL HIGH (ref 0.00–0.07)
Basophils Absolute: 0 10*3/uL (ref 0.0–0.1)
Basophils Relative: 0 %
Eosinophils Absolute: 0.1 10*3/uL (ref 0.0–0.5)
Eosinophils Relative: 0 %
HCT: 23.8 % — ABNORMAL LOW (ref 36.0–46.0)
Hemoglobin: 7.7 g/dL — ABNORMAL LOW (ref 12.0–15.0)
Immature Granulocytes: 2 %
Lymphocytes Relative: 9 %
Lymphs Abs: 2.5 10*3/uL (ref 0.7–4.0)
MCH: 27.1 pg (ref 26.0–34.0)
MCHC: 32.4 g/dL (ref 30.0–36.0)
MCV: 83.8 fL (ref 80.0–100.0)
Monocytes Absolute: 0.9 10*3/uL (ref 0.1–1.0)
Monocytes Relative: 3 %
Neutro Abs: 24.6 10*3/uL — ABNORMAL HIGH (ref 1.7–7.7)
Neutrophils Relative %: 86 %
Platelets: 111 10*3/uL — ABNORMAL LOW (ref 150–400)
RBC: 2.84 MIL/uL — ABNORMAL LOW (ref 3.87–5.11)
RDW: 19.1 % — ABNORMAL HIGH (ref 11.5–15.5)
Smear Review: NORMAL
WBC: 28.9 10*3/uL — ABNORMAL HIGH (ref 4.0–10.5)
nRBC: 2.9 % — ABNORMAL HIGH (ref 0.0–0.2)

## 2018-09-07 LAB — HEPATIC FUNCTION PANEL
ALT: 20 U/L (ref 0–44)
AST: 36 U/L (ref 15–41)
Albumin: 1.7 g/dL — ABNORMAL LOW (ref 3.5–5.0)
Alkaline Phosphatase: 63 U/L (ref 38–126)
Bilirubin, Direct: 8.2 mg/dL — ABNORMAL HIGH (ref 0.0–0.2)
Indirect Bilirubin: 5.3 mg/dL — ABNORMAL HIGH (ref 0.3–0.9)
Total Bilirubin: 13.5 mg/dL — ABNORMAL HIGH (ref 0.3–1.2)
Total Protein: 6.4 g/dL — ABNORMAL LOW (ref 6.5–8.1)

## 2018-09-07 LAB — PROTIME-INR
INR: 2.1 — ABNORMAL HIGH (ref 0.8–1.2)
Prothrombin Time: 23.1 seconds — ABNORMAL HIGH (ref 11.4–15.2)

## 2018-09-07 LAB — PREPARE RBC (CROSSMATCH)

## 2018-09-07 LAB — MAGNESIUM: Magnesium: 2.1 mg/dL (ref 1.7–2.4)

## 2018-09-07 LAB — GLUCOSE, CAPILLARY
Glucose-Capillary: 101 mg/dL — ABNORMAL HIGH (ref 70–99)
Glucose-Capillary: 107 mg/dL — ABNORMAL HIGH (ref 70–99)
Glucose-Capillary: 137 mg/dL — ABNORMAL HIGH (ref 70–99)
Glucose-Capillary: 89 mg/dL (ref 70–99)
Glucose-Capillary: 91 mg/dL (ref 70–99)
Glucose-Capillary: 96 mg/dL (ref 70–99)

## 2018-09-07 LAB — CORTISOL: Cortisol, Plasma: 18.8 ug/dL

## 2018-09-07 LAB — LACTIC ACID, PLASMA: Lactic Acid, Venous: 2.1 mmol/L (ref 0.5–1.9)

## 2018-09-07 MED ORDER — SODIUM CHLORIDE 0.9% IV SOLUTION
Freq: Once | INTRAVENOUS | Status: AC
  Administered 2018-09-07: 06:00:00 via INTRAVENOUS

## 2018-09-07 MED ORDER — THIAMINE HCL 100 MG/ML IJ SOLN
500.0000 mg | Freq: Every day | INTRAVENOUS | Status: DC
  Administered 2018-09-07 – 2018-09-10 (×4): 500 mg via INTRAVENOUS
  Filled 2018-09-07 (×5): qty 5

## 2018-09-07 MED ORDER — ALBUMIN HUMAN 25 % IV SOLN
12.5000 g | Freq: Two times a day (BID) | INTRAVENOUS | Status: AC
  Administered 2018-09-07 – 2018-09-08 (×4): 12.5 g via INTRAVENOUS
  Filled 2018-09-07 (×4): qty 50

## 2018-09-07 MED ORDER — VITAMIN K1 10 MG/ML IJ SOLN
10.0000 mg | Freq: Every day | INTRAVENOUS | Status: AC
  Administered 2018-09-07 – 2018-09-09 (×3): 10 mg via INTRAVENOUS
  Filled 2018-09-07 (×5): qty 1

## 2018-09-07 MED ORDER — SODIUM CHLORIDE 0.9 % IV SOLN
3.0000 g | Freq: Four times a day (QID) | INTRAVENOUS | Status: DC
  Administered 2018-09-07 – 2018-09-09 (×7): 3 g via INTRAVENOUS
  Filled 2018-09-07 (×5): qty 3
  Filled 2018-09-07: qty 8
  Filled 2018-09-07 (×2): qty 3

## 2018-09-07 MED ORDER — DEXTROSE 5 % IV SOLN
INTRAVENOUS | Status: DC
  Administered 2018-09-07 – 2018-09-08 (×3): via INTRAVENOUS

## 2018-09-07 MED ORDER — SODIUM CHLORIDE 0.9% IV SOLUTION
Freq: Once | INTRAVENOUS | Status: AC
  Administered 2018-09-07: 13:00:00 via INTRAVENOUS

## 2018-09-07 MED ORDER — HYDROCORTISONE NA SUCCINATE PF 100 MG IJ SOLR
50.0000 mg | Freq: Four times a day (QID) | INTRAMUSCULAR | Status: DC
  Administered 2018-09-07 – 2018-09-10 (×12): 50 mg via INTRAVENOUS
  Filled 2018-09-07 (×12): qty 2

## 2018-09-07 NOTE — Progress Notes (Addendum)
Pharmacy Electrolyte Monitoring Consult:  Pharmacy consulted to assist in monitoring and replacing electrolytes in this 53 y.o. female admitted on 09/08/2018 with Constipation and Chest Pain   Labs:  Sodium (mmol/L)  Date Value  09/07/2018 143   Potassium (mmol/L)  Date Value  09/07/2018 3.8   Magnesium (mg/dL)  Date Value  09/07/2018 2.1   Phosphorus (mg/dL)  Date Value  09/06/2018 4.1   Calcium (mg/dL)  Date Value  09/07/2018 7.0 (L)   Albumin (g/dL)  Date Value  09/07/2018 1.7 (L)   Corrected Calcium: 8.8  Assessment/Plan: Patient requiring mechanical intubation. Patient with past medical history including alcohol abuse. Patient ordered D5 at 196mL/hr to maintain glucose.   Will defer replacement today. If replacement warranted in am, will add potassium to MIVF after consulting MD.   Will replace to maintain potassium ~ 4, magnesium ~ 2, and goal phosphorus ~ 2.5-3.   Pharmacy will continue to monitor and adjust per consult.   Kerrington Greenhalgh L 09/07/2018 5:03 PM

## 2018-09-07 NOTE — Progress Notes (Signed)
*  PRELIMINARY RESULTS* Echocardiogram 2D Echocardiogram has been performed.  Mackenzie Little 09/07/2018, 2:36 PM

## 2018-09-07 NOTE — Progress Notes (Signed)
GI Inpatient Follow-up Note  Subjective:  Patient seen and examined this afternoon in ICU. No acute events overnight. Last BM was reportedly last night and was still black and tarry. Sedation was paused to assess neurologic function, but patient was still unable to follow commands so sedation was restarted. She still remains on pressors. She received 1 unit of FFP this morning after her INR came back 2.1. Hemoglobin this morning was 7.7. Hemoglobin was rechecked at noon today and was 7.1. No family at bedside.   Scheduled Inpatient Medications:  . chlorhexidine gluconate (MEDLINE KIT)  15 mL Mouth Rinse BID  . Chlorhexidine Gluconate Cloth  6 each Topical QHS  . folic acid  1 mg Intravenous Daily  . hydrocortisone sod succinate (SOLU-CORTEF) inj  50 mg Intravenous Q6H  . mouth rinse  15 mL Mouth Rinse 10 times per day  . pantoprazole  40 mg Intravenous Q12H  . sodium chloride flush  10-40 mL Intracatheter Q12H    Continuous Inpatient Infusions:   . sodium chloride 250 mL (09/04/18 0904)  . albumin human 60 mL/hr at 09/07/18 1155  . ampicillin-sulbactam (UNASYN) IV    . dexmedetomidine (PRECEDEX) IV infusion Stopped (09/04/18 1111)  . dextrose 100 mL/hr at 09/07/18 1155  . fentaNYL infusion INTRAVENOUS 225 mcg/hr (09/07/18 1155)  . norepinephrine (LEVOPHED) Adult infusion 35 mcg/min (09/07/18 1155)  . octreotide  (SANDOSTATIN)    IV infusion 50 mcg/hr (09/07/18 1155)  . phytonadione (VITAMIN K) IV 10 mg (09/07/18 1201)  . thiamine injection    . vasopressin (PITRESSIN) infusion - *FOR SHOCK* Stopped (09/07/18 0908)    PRN Inpatient Medications:  sodium chloride, acetaminophen **OR** acetaminophen, LORazepam, ondansetron **OR** ondansetron (ZOFRAN) IV, sodium chloride flush, traMADol  Review of Systems: Constitutional: Weight is stable.  Eyes: No changes in vision. ENT: No oral lesions, sore throat.  GI: see HPI.  Heme/Lymph: No easy bruising.  CV: No chest pain.  GU: No  hematuria.  Integumentary: No rashes.  Neuro: No headaches.  Psych: No depression/anxiety.  Endocrine: No heat/cold intolerance.  Allergic/Immunologic: No urticaria.  Resp: No cough, SOB.  Musculoskeletal: No joint swelling.    Physical Examination: BP (!) 96/45 (BP Location: Left Arm)   Pulse 88   Temp 98.2 F (36.8 C) (Axillary)   Resp 16   Ht '5\' 6"'  (1.676 m)   Wt 75.4 kg   SpO2 94%   BMI 26.83 kg/m  Gen: NAD, alert and oriented x 4 HEENT: PEERLA, EOMI, Neck: supple, no JVD or thyromegaly Chest: CTA bilaterally, no wheezes, crackles, or other adventitious sounds CV: RRR, no m/g/c/r Abd: soft, NT, ND, +BS in all four quadrants; no HSM, guarding, ridigity, or rebound tenderness Ext: no edema, well perfused with 2+ pulses, Skin: no rash or lesions noted Lymph: no LAD  Data: Lab Results  Component Value Date   WBC 28.9 (H) 09/07/2018   HGB 7.1 (L) 09/07/2018   HCT 22.6 (L) 09/07/2018   MCV 83.8 09/07/2018   PLT 111 (L) 09/07/2018   Recent Labs  Lab 09/06/18 2204 09/07/18 0425 09/07/18 1215  HGB 7.6* 7.7* 7.1*   Lab Results  Component Value Date   NA 143 09/07/2018   K 3.8 09/07/2018   CL 120 (H) 09/07/2018   CO2 15 (L) 09/07/2018   BUN 39 (H) 09/07/2018   CREATININE 1.20 (H) 09/07/2018   Lab Results  Component Value Date   ALT 20 09/07/2018   AST 36 09/07/2018   ALKPHOS 63 09/07/2018  BILITOT 13.5 (H) 09/07/2018   Recent Labs  Lab 09/07/18 0425  INR 2.1*   Assessment/Plan:  53 y/o AA female admitted for generalized weakness, melena x 2 weeks  1. Severe acute hypoxic and hypercapnic respiratory failure 2. Multilobar pneumonia - suspected 2/2 aspiration, on IV Zosyn.  3. Alcoholic hepatitis  4. Decompensated alcoholic cirrhosis - labs today show total bilirubin 14.4. Likely 2/2 cholestasis from acute illness and infection  - Transfuse 1 unit pRBCs. Continue to keep Hemoglobin >7. Transfuse as necessary. - Continue IV Protonix and  Octreotide - Continue Pentoxifylline  - Continue to give FFP prn. INR needs to be <1.7 - Plan for endoscopy when clinically feasible - Pt continues to have melena, which is suggestive of occult GI bleeding in the form of esophagitis, gastritis, PUD, AVMs, varices, malignancy - Please call for any signs of active GI bleeding - Continue care per intensivist     Please call with questions or concerns.    Octavia Bruckner, PA-C [facility] [phone] (Cell)

## 2018-09-07 NOTE — Progress Notes (Addendum)
Pharmacy Antibiotic Note  Mackenzie Little is a 53 y.o. female admitted on 09/17/2018. Patient initially started on ceftriaxone, cefepime, metronidazole, and vancomycin for sepsis. Patient intubated, requiring pressors, and on multiple infusions secondary to GI bleed. During AM rounds, patient noted to have possible aspiration pneumonia and patient to be transitioned to ampicillin-sulbactam. Pharmacy has been consulted for ampicillin-sulbactam dosing.  Plan: Will initiate ampicillin-sulbactam 3g IV Q6hr.   Height: 5\' 6"  (167.6 cm) Weight: 166 lb 3.6 oz (75.4 kg) IBW/kg (Calculated) : 59.3  Temp (24hrs), Avg:98.4 F (36.9 C), Min:97.7 F (36.5 C), Max:99 F (37.2 C)  Recent Labs  Lab 08/29/2018 2116 09/23/2018 2328 08/29/2018 0130 09/01/2018 0455 08/30/2018 0827 09/03/18 0500 09/04/18 0400 09/05/18 0534 09/05/18 1337 09/06/18 0356 09/06/18 1142 09/07/18 0425  WBC  --   --   --  20.1*  --  8.4 10.6* 17.4*  --   --  26.3* 28.9*  CREATININE  --   --   --  3.00*  --  1.22* 0.62 1.16* UNABLE TO REPORT DUE TO ICTERIC INTERFERENCE SDR 1.23*  --  1.20*  LATICACIDVEN 8.5* 8.2* 6.6* 3.7* 2.0*  --   --   --   --   --   --   --     Estimated Creatinine Clearance: 57.5 mL/min (A) (by C-G formula based on SCr of 1.2 mg/dL (H)).    No Known Allergies  Antimicrobials this admission: Ceftriaxone 08/05 x 1 Cefepime 08/05 >> 08/11 Vancomycin 08/05 x 1  Metronidazole 08/06 >> 08/11 Unasyn 08/11 >>  Dose adjustments this admission: n/a  Microbiology results: 08/05 BCx: no growth 08/09 BCx: no growth x 2 days 08/05 Respiratory Cx: rare candida albicans; culture reincubated for better growth 08/05 MRSA PCR: negative  Thank you for allowing pharmacy to be a part of this patient's care.  Mackenzie Little Dear Mackenzie Little 09/07/2018 11:05 AM

## 2018-09-07 NOTE — Progress Notes (Signed)
Name: Mackenzie Little MRN: [id number] DOB: 02-04-1965     CONSULTATION DATE: 09/07/2018  CHIEF COMPLAINT:  GI bleed.   HISTORY OF PRESENT ILLNESS:  53 year old female with hypovolemic shock secondary to GI bleed with resultant acute kidney injury, acute respiratory failure requiring intubation, with suspected aspiration pneumonia, and liver failure secondary to alcoholic hepatitis, with DT and associated encephalopathy. She presented on 8/5 to the ED noting melena x 2 weeks with weakness and single episode of N/V. She was hypotensive with SBP < 80. Initial labs with evidence of acute blood loss and elevated INR, elevated troponins, and evidence of AKI.She received LR, octreotide, vitamin K, and protonix in the ED, and was transferred to the ICU. Central line was placed, to administer vasopressors. Remaining hospital course noted below.  SIGNIFICANT EVENTS: -08/31/2018: ED visit c/o melena and weakness. Hgb low, with hypotension. Started on vit K, octreotide, Protonix and IVF (LR). Brought to ICU. Central line placed. Required vasopressors. Received FFP and 2 units PRBC for coagulopathy and Hgb<7.GI consulted.  - 09/14/2018: EGD postponed, due to critical condition, per GI. Paracentesis planned, not completed due to lack of ascites on abdominal US. RUQ [place] with evidence of cirrhotic liver changes, and contracted, thickened gallbladder wall possibly due to artifact from ascites/contraction, with small gallstones. Continued to require vasopressors. Weaned precedex as tolerated. Restarted Cefepime. 1 U PRBC and FFP administered. - 09/03/2018: Weaning off of pressors with IVF bolus. EGD continued to be postponed. Continued tarry stools. 2 U PRBC. -09/04/2018: Intubated with ventilary support. Remained on precedex and vasopressors. Vaginal bleeding investigated with US pelvis - unremarkable. CXR with bilateral R> L multifocal infiltrates at bases, c/w aspiration vs. pneumonia.  -09/05/2018: Remains on pressors.  Repeat CXR with unchanged extensive bil mid/lower lung patchy consolidation c/w multi-lobar pneumonia. At high risk for endoscopy and OG tube placement - per GI. -09/06/2018: Remains on pressors. IV albumin administered. Remains NPO.  OVERNIGHT EVENTS: - Continued black tarry stools. - Paused sedation, to assess neurologic function. Patient opened her eyes, but did not track any one in room. She was unable to follow commands. Sedation was restarted. No indication for CIWA protocol overnight.  Today she is unchanged, and still remains on pressors. She is receiving FFP, for continued coagulopathy on labs (INR 2.1). Unable to perform ROS given patient's critical condition.   PAST MEDICAL HISTORY :   has a past medical history of Hypertension.  has a past surgical history that includes Tooth extraction (May 2016); Esophagogastroduodenoscopy (N/A, 07/07/2017); and Colonoscopy (N/A, 07/07/2017). Prior to Admission medications   Medication Sig Start Date End Date Taking? Authorizing Provider  acetaminophen (TYLENOL) 325 MG tablet Take 650 mg by mouth every 6 (six) hours as needed.   Yes [provider]   No Known Allergies  FAMILY HISTORY:  family history is not on file. SOCIAL HISTORY:  reports that she has quit smoking. She has never used smokeless tobacco. She reports current alcohol use. She reports that she does not use drugs.  REVIEW OF SYSTEMS:   Unable to obtain due to critical illness  VITAL SIGNS: Temp:  [97.7 F (36.5 C)-99 F (37.2 C)] 98.4 F (36.9 C) (08/11 0800) Pulse Rate:  [73-87] 83 (08/11 0800) Resp:  [13-19] 16 (08/11 0800) BP: (74-154)/(47-142) 108/54 (08/11 0800) SpO2:  [92 %-99 %] 95 % (08/11 0800) FiO2 (%):  [35 %] 35 % (08/11 0800)   I/O last 3 completed shifts: In: 4685.9 [I.V.:4194.9; IV Piggyback:491] Out: 525 [Urine:525] Total  I/O In: 584.1 [I.V.:184.1; IV Piggyback:400] Out: 100 [Urine:100]   SpO2: 95 % O2 Flow Rate (L/min): (S) 15 L/min  FiO2 (%): 35 %  Physical Examination:  GENERAL: Critically ill appearing, jaundiced. Intubated and sedation. Non-agitated.  HEAD: Normocephalic, atraumatic.  EYES: Pupils equal, round, constricted and minimally reactive to light. + scleral icterus.  MOUTH: Moist mucosal membrane. NECK: Supple. No JVD.  PULMONARY: Bibasilar crackles. Otherwise CTAB. CARDIOVASCULAR: S1 and S2. Regular rate and rhythm. + murmurs RU sternal border. No murmurs rubs, or gallops.  GASTROINTESTINAL: Soft, nontender, mildly distended. RUQ/epigastric fullness, possibly c/w distended stomach  Positive bowel sounds. EXTREMITIES: No edema. 2+ radial pulses. 1+ DP pulses. NEUROLOGIC: Patient is obtunded. Unable to follow commands. Does not wake to voice or painful stimuli. + corneal reflex. + gag reflex. Patellar reflex hypotonic. SKIN: intact, warm, dry  I personally reviewed lab work that was obtained in last 24 hrs.  MEDICATIONS: I have reviewed all medications and confirmed regimen as documented  Hepatic Function Latest Ref Rng & Units 09/07/2018 09/06/2018 09/05/2018  Total Protein 6.5 - 8.1 g/dL 6.4(L) 6.6 6.5  Albumin 3.5 - 5.0 g/dL 1.7(L) 1.8(L) 1.7(L)  AST 15 - 41 U/L 36 39 50(H)  ALT 0 - 44 U/L 20 22 UNABLE TO REPORT DUE TO ICTERIC INTERFERENCE SDR  Alk Phosphatase 38 - 126 U/L 63 52 38  Total Bilirubin 0.3 - 1.2 mg/dL 13.5(H) 14.4(H) 15.0(H)  Bilirubin, Direct 0.0 - 0.2 mg/dL 8.2(H) 8.9(H) -    CBC Latest Ref Rng & Units 09/07/2018 09/07/2018 09/06/2018  WBC 4.0 - 10.5 K/uL - 28.9(H) -  Hemoglobin 12.0 - 15.0 g/dL 7.1(L) 7.7(L) 7.6(L)  Hematocrit 36.0 - 46.0 % 22.6(L) 23.8(L) 24.0(L)  Platelets 150 - 400 K/uL - 111(L) -   CMP Latest Ref Rng & Units 09/07/2018 09/06/2018 09/05/2018  Glucose 70 - 99 mg/dL 105(H) 121(H) 125(H)  BUN 6 - 20 mg/dL 39(H) 38(H) 37(H)  Creatinine 0.44 - 1.00 mg/dL 1.20(H) 1.23(H) UNABLE TO REPORT DUE TO ICTERIC INTERFERENCE SDR  Sodium 135 - 145 mmol/L 143 143 143  Potassium 3.5 -  5.1 mmol/L 3.8 4.0 4.2  Chloride 98 - 111 mmol/L 120(H) 120(H) 118(H)  CO2 22 - 32 mmol/L 15(L) 17(L) 15(L)  Calcium 8.9 - 10.3 mg/dL 7.0(L) 6.7(L) 6.8(L)  Total Protein 6.5 - 8.1 g/dL 6.4(L) 6.6 6.5  Total Bilirubin 0.3 - 1.2 mg/dL 13.5(H) 14.4(H) 15.0(H)  Alkaline Phos 38 - 126 U/L 63 52 38  AST 15 - 41 U/L 36 39 50(H)  ALT 0 - 44 U/L 20 22 UNABLE TO REPORT DUE TO ICTERIC INTERFERENCE SDR    CULTURE RESULTS   Recent Results (from the past 240 hour(s))  Culture, blood (routine x 2)     Status: None   Collection Time: 09/12/2018  1:25 PM   Specimen: BLOOD  Result Value Ref Range Status   Specimen Description BLOOD LEFT ANTECUBITAL  Final   Special Requests   Final    BOTTLES DRAWN AEROBIC AND ANAEROBIC Blood Culture adequate volume   Culture   Final    NO GROWTH 5 DAYS Performed at [facility], [address] [place], [place] [address]    Report Status 09/06/2018 FINAL  Final  Culture, blood (routine x 2)     Status: None   Collection Time: 09/03/2018  1:39 PM   Specimen: BLOOD  Result Value Ref Range Status   Specimen Description BLOOD RIGHT ANTECUBITAL  Final   Special Requests   Final  BOTTLES DRAWN AEROBIC AND ANAEROBIC Blood Culture results may not be optimal due to an excessive volume of blood received in culture bottles   Culture   Final    NO GROWTH 5 DAYS Performed at [facility], [address] [place], [place] [address]    Report Status 09/06/2018 FINAL  Final  SARS Coronavirus 2 [facility] order, Performed in [facility] hospital lab)     Status: None   Collection Time: 09/17/2018  4:22 PM  Result Value Ref Range Status   SARS Coronavirus 2 NEGATIVE NEGATIVE Final    Comment: (NOTE) SARS CoV 2 target nucleic acids are NOT DETECTED. The SARS CoV 2 RNA is generally detectable in upper and lower respiratory specimens during the acute phase of infection. The lowest concentration of SARS CoV 2 viral copies this assay can detect is 250 copies  per mL. A negative result does not preclude SARS CoV 2 infection and should not be used as the sole basis for treatment or other patient management decisions. A negative result may occur with improper specimen collection and handling, submission  of specimen other than nasopharyngeal swab, presence of viral mutation(s) within the areas targeted by this assay, and inadequate number of viral copies (less than 250 copies per mL). A negative result must be combined with clinical observations, patient history,  and epidemiological information. The expected result is Negative. Fact Sheet for Patients:   https [employer] 680321 download Fact Sheet for Healthcare Providers:   https [employer] 212-248-8468 d Jenel Lucks This test is not yet approved or cleared by the [place] FDA and  has been authorized for detection and/or diagnosis of SARS CoV 2 by FDA under an Emergency Use Authorization (EUA).  This EUA will remain in effect (meaning this test can be used) for the duration of  the COVID19 declaration under Section 564(b)(1) of the Act, 21 U.S.C.  section 360bbb-3(b)(1), unless the authorization is terminated or revoked sooner. Performed at [facility], [address] [place], [place] [address]   MRSA PCR Screening     Status: None   Collection Time: 09/13/2018  8:38 PM   Specimen: Nasal Mucosa; Nasopharyngeal  Result Value Ref Range Status   MRSA by PCR NEGATIVE NEGATIVE Final    Comment:        The GeneXpert MRSA Assay (FDA approved for NASAL specimens only), is one component of a comprehensive MRSA colonization surveillance program. It is not intended to diagnose MRSA infection nor to guide or monitor treatment for MRSA infections. Performed at [facility], [address] [place], [place] [address]   Culture, blood (Routine X 2) w Reflex to ID Panel     Status: None (Preliminary result)   Collection Time: 09/05/18  6:52 AM   Specimen: BLOOD   Result Value Ref Range Status   Specimen Description BLOOD L HAND  Final   Special Requests   Final    BOTTLES DRAWN AEROBIC AND ANAEROBIC Blood Culture adequate volume   Culture   Final    NO GROWTH 2 DAYS Performed at [facility], [address], [place], [place] [address]    Report Status PENDING  Incomplete  Culture, blood (Routine X 2) w Reflex to ID Panel     Status: None (Preliminary result)   Collection Time: 09/05/18  7:04 AM   Specimen: BLOOD  Result Value Ref Range Status   Specimen Description BLOOD L FOREARM  Final   Special Requests   Final  BOTTLES DRAWN AEROBIC AND ANAEROBIC Blood Culture adequate volume   Culture   Final    NO GROWTH 2 DAYS Performed at [facility], [address] [place], [place] [address]    Report Status PENDING  Incomplete  Culture, respiratory (non-expectorated)     Status: None (Preliminary result)   Collection Time: 09/05/18 11:42 AM   Specimen: Tracheal Aspirate; Respiratory  Result Value Ref Range Status   Specimen Description   Final    TRACHEAL ASPIRATE Performed at [facility], [address] [place], [place] [address]    Special Requests   Final    NONE Performed at [facility], [place], [place] [address]    Gram Stain   Final    FEW WBC PRESENT,BOTH PMN AND MONONUCLEAR RARE YEAST RARE GRAM POSITIVE COCCI NO SQUAMOUS EPITHELIAL CELLS PRESENT    Culture   Final    CULTURE REINCUBATED FOR BETTER GROWTH Performed at [facility], [place] [address], [place], [place] [address]    Report Status PENDING  Incomplete    IMAGING    Dg Chest Port 1 View  Result Date: 09/07/2018 CLINICAL DATA:  53 year old female with respiratory failure. Negative for COVID-19 on 08/30/2018. EXAM: PORTABLE CHEST 1 VIEW COMPARISON:  8920 and earlier. FINDINGS: Portable AP semi upright view at 0330 hours. Stable ET tube tip about 1 centimeter above the carina. The patient is mildly  rotated to the right. Stable cardiomegaly and mediastinal contours. Multifocal confluent bilateral pulmonary opacity, sparing the left upper lobe. Mild progression of right lung opacity. No superimposed pneumothorax or pleural effusion. No acute osseous abnormality identified. Negative visible bowel gas pattern. IMPRESSION: 1. Multifocal bilateral pneumonia with mild progression in the right lung. No pleural effusion. 2. Stable ET tube. Electronically Signed   By: Genevie Ann M.D.   On: 09/07/2018 03:58   Indwelling Urinary Catheter continued, requirement due to   Reason to continue Indwelling Urinary Catheter strict Intake/Output monitoring for hemodynamic instability   Central Line/ continued, requirement due to  Reason to continue [place] of central venous pressure or other hemodynamic parameters and poor IV access   Ventilator continued, requirement due to severe respiratory failure   Ventilator Sedation RASS 0 to -2   ASSESSMENT AND PLAN SYNOPSIS: As mentioned above, this is a 53 year old female with hypovolemic shock secondary to GI bleed with resultant acute kidney injury, acute respiratory failure requiring intubation, with suspected aspiration pneumonia, and liver failure secondary to alcoholic hepatitis, with DT and associated encephalopathy. Prognosis remains guarded, with multi-organ failure (kidney, liver w/ coagulopathy), and requirement of vasopressors.  Severe ACUTE Hypoxic Respiratory Failure -continue Full MV support -Wean Fio2 and PEEP as tolerated - SAT this morning, poor mentation. SBT unable to be completed due to patient's inability to follow commands.  ACUTE GI BLEED - GI following. - Will administer 1 U PRBC.  - Will monitor H/H q 8 hours. Transfuse for Hgb < 7 - Possible varices. Continue Octreotide and Protonix. - Plan for endoscopy for acute GI bleed when more hemodynamically stable.  MULTILOBAR PNEUMONIA - suspected aspiration - No interval  improvement on CXR, seen above. - D/c cefepime and flagyl, and start UNASYN for anaerobe coverage, given suspected aspiration pneumonia.  SHOCK-HYPOVOLEMIC due to underlying cirrhosis - use vasopressors to keep MAP>65 - Lactic acid levels ordered. Results pending. - follow up blood cultures - no growth after 2 days. - Antibiotics, as above. - Cortisol levels ordered.  Started stress dose steroids.  - Agressive IV fluid resuscitation.  ACUTE KIDNEY INJURY/Renal Failure -follow chem 7 -follow UO -continue Foley Catheter-assess need -Avoid nephrotoxic agents  LIVER DYSFUNCTION - Will order IV albumin BID x 4 doses, with hopes to wean off levophed. - Will order vitamin K today. - Will continue to monitor liver function.  NEUROLOGY - intubated and sedated - minimal sedation to achieve a RASS goal: -1 - Continue CIWA protocol, with Precedex, and Ativan PRN. - Will increase dose of IV thiamine.   Cardiomegaly on CXR - ECHO ordered to assess for possible alcoholic cardiomyopathy. - Continue ICU monitoring.  GI GI PROPHYLAXIS as indicated  NUTRITIONAL STATUS DIET--> NPO (s/p 4 days since intubation) Constipation protocol as indicated.  ENDO - will use ICU hypoglycemic\Hyperglycemia protocol if needed  ELECTROLYTES - Continued hyperchloremia. Will change D5 to 171m/hour.  - follow labs as needed - replace as needed - pharmacy consultation and following  DVT/GI PRX ordered TRANSFUSIONS AS NEEDED MONITOR FSBS ASSESS the need for LABS

## 2018-09-07 NOTE — Progress Notes (Signed)
[place] [facility] [place] NAME: Mackenzie Little    MR#:  [id number]  DATE OF BIRTH:  February 24, 1965  SUBJECTIVE:  CHIEF COMPLAINT:   Chief Complaint  Patient presents with  . Constipation  . Chest Pain  Patient seen and evaluated today Has been intubated put on ventilator by ICU attending Currently on IV pressor medication to support blood pressure On mechanical ventilator Tidal volume 500 Rate 16 FiO2 35% PEEP 5 Currently n.p.o.  REVIEW OF SYSTEMS:    ROS  On mechanical ventilator  DRUG ALLERGIES:  No Known Allergies  VITALS:  Blood pressure (!) 92/44, pulse 86, temperature 98.5 F (36.9 C), temperature source Axillary, resp. rate 16, height 5\' 6"  (1.676 m), weight 75.4 kg, SpO2 94 %.  PHYSICAL EXAMINATION:   Physical Exam  GENERAL:  53 y.o.-year-old patient lying in the bed on ventilator EYES: Pupils equal, round, reactive to light and accommodation. Has scleral icterus. Extraocular muscles intact.  HEENT: Head atraumatic, normocephalic. Oropharynx endotracheal tube noted NECK:  Supple, no jugular venous distention. No thyroid enlargement, no tenderness.  LUNGS: Improved breath sounds bilaterally, scattered rales in both lungs.  On ventilator CARDIOVASCULAR: S1, S2 normal. No murmurs, rubs, or gallops.  ABDOMEN: Soft, nontender, nondistended. Bowel sounds present. No organomegaly or mass.  EXTREMITIES: No cyanosis, clubbing or edema b/l.    NEUROLOGIC: Complete nervous system exam was not possible as patient is on ventilator PSYCHIATRIC: The patient is alert and oriented x none.  SKIN: No obvious rash, lesion, or ulcer.   LABORATORY PANEL:   CBC Recent Labs  Lab 09/07/18 0425  WBC 28.9*  HGB 7.7*  HCT 23.8*  PLT 111*   ------------------------------------------------------------------------------------------------------------------ Chemistries  Recent Labs  Lab 09/07/18 0425  NA 143  K 3.8  CL 120*  CO2 15*   GLUCOSE 105*  BUN 39*  CREATININE 1.20*  CALCIUM 7.0*  MG 2.1  AST 36  ALT 20  ALKPHOS 63  BILITOT 13.5*   ------------------------------------------------------------------------------------------------------------------  Cardiac Enzymes No results for input(s): TROPONINI in the last 168 hours. ------------------------------------------------------------------------------------------------------------------  RADIOLOGY:  Dg Chest Port 1 View  Result Date: 09/07/2018 CLINICAL DATA:  53 year old female with respiratory failure. Negative for COVID-19 on 09/02/2018. EXAM: PORTABLE CHEST 1 VIEW COMPARISON:  8920 and earlier. FINDINGS: Portable AP semi upright view at 0330 hours. Stable ET tube tip about 1 centimeter above the carina. The patient is mildly rotated to the right. Stable cardiomegaly and mediastinal contours. Multifocal confluent bilateral pulmonary opacity, sparing the left upper lobe. Mild progression of right lung opacity. No superimposed pneumothorax or pleural effusion. No acute osseous abnormality identified. Negative visible bowel gas pattern. IMPRESSION: 1. Multifocal bilateral pneumonia with mild progression in the right lung. No pleural effusion. 2. Stable ET tube. Electronically Signed   By: Genevie Ann M.D.   On: 09/07/2018 03:58     ASSESSMENT AND PLAN:   53 year old female patientwith a known history ofhypertension, alcohol abuse presented to the emergency room for generalized weakness.Patient has been fatigued for the last 3 days and noticed dark tarry stool for the last couple of days.   Patient was tired and fatigued and had labored breathing this morning and has been electively intubated and put on ventilator by intensivist.  -Hypovolemic shock and multiorgan failure -due to active GI bleed -  Initial labs with evidence of acute blood loss (Hgb 6.5), and elevated INR, elevated troponins, and evidence of AKI (Cr 2.47, GFR 25) - on vasopressors to maintain  MAP  >65. Status post 2U PRBC and FFP last night.   -Acute respiratory failure with hypoxia Mechanical ventilator to continue Vent bundle Intensivist follow-up  -Multilobar pneumonia Continue iv cefepime and iv flagyl abx F/u cultures  -Acute gastrointestinal bleeding -on octreotide and Protonix drip - melena x 2 weeks, received LR, octreotide, vitamin K, and protonix  - GI is following the patient, endoscopy as per GI whenever patient is clinically stable -Current hemoglobin 7.7  -Abnormal liver function tests Repeat LFTs Avoid hepatotoxic drugs Likely cholestasis from acute infection  -Acute symptomatic severe anemia - status post transfuse 2 units PRBC   -Acute kidney injury: Likely due to ATN due to hypotension Avoid nephrotoxic medications IV fluids and monitor renal function  -Elevated lactic acid -likely due to multiorgan failure and hypotension/dehydration Follow-up levels Empiric broad spectrum antibiotics  -Elevated troponins  -Likely from demand ischemia  -Acute hypokalemia Potassium to be replaced aggressively  -Long-term prognosis poor  All the records are reviewed and case discussed with Care Management/Social Worker. Management plans discussed with the patient, family and they are in agreement.  CODE STATUS: Full code  DVT Prophylaxis: SCDs  TOTAL TIME TAKING CARE OF THIS PATIENT: 34 minutes.   POSSIBLE D/C IN 4 to 5 DAYS, DEPENDING ON CLINICAL CONDITION.  Saundra Shelling M.D on 09/07/2018 at 10:29 AM  Between 7am to 6pm - Pager - [phone]  After 6pm go to [email or web address] - password [email or web address] [facility]  Office  [phone]  CC: Primary care physician; Ranae Plumber, PA  Note: This dictation was prepared with Dragon dictation along with smaller phrase technology. Any transcriptional errors that result from this process are unintentional.

## 2018-09-07 NOTE — Progress Notes (Signed)
MD notified that lactic acid is 2.1. Hgb 7.1.

## 2018-09-08 ENCOUNTER — Inpatient Hospital Stay: Payer: Medicaid Other

## 2018-09-08 DIAGNOSIS — K7010 Alcoholic hepatitis without ascites: Secondary | ICD-10-CM

## 2018-09-08 LAB — COMPREHENSIVE METABOLIC PANEL
ALT: 19 U/L (ref 0–44)
AST: 43 U/L — ABNORMAL HIGH (ref 15–41)
Albumin: 2.1 g/dL — ABNORMAL LOW (ref 3.5–5.0)
Alkaline Phosphatase: 67 U/L (ref 38–126)
Anion gap: 6 (ref 5–15)
BUN: 41 mg/dL — ABNORMAL HIGH (ref 6–20)
CO2: 14 mmol/L — ABNORMAL LOW (ref 22–32)
Calcium: 6.7 mg/dL — ABNORMAL LOW (ref 8.9–10.3)
Chloride: 116 mmol/L — ABNORMAL HIGH (ref 98–111)
Creatinine, Ser: 1.92 mg/dL — ABNORMAL HIGH (ref 0.44–1.00)
GFR calc Af Amer: 34 mL/min — ABNORMAL LOW (ref >60)
GFR calc non Af Amer: 30 mL/min — ABNORMAL LOW (ref >60)
Glucose, Bld: 150 mg/dL — ABNORMAL HIGH (ref 70–99)
Potassium: 4 mmol/L (ref 3.5–5.1)
Sodium: 136 mmol/L (ref 135–145)
Total Bilirubin: 15.7 mg/dL — ABNORMAL HIGH (ref 0.3–1.2)
Total Protein: 6.9 g/dL (ref 6.5–8.1)

## 2018-09-08 LAB — PREPARE FRESH FROZEN PLASMA: Unit division: 0

## 2018-09-08 LAB — GLUCOSE, CAPILLARY
Glucose-Capillary: 106 mg/dL — ABNORMAL HIGH (ref 70–99)
Glucose-Capillary: 125 mg/dL — ABNORMAL HIGH (ref 70–99)
Glucose-Capillary: 130 mg/dL — ABNORMAL HIGH (ref 70–99)
Glucose-Capillary: 130 mg/dL — ABNORMAL HIGH (ref 70–99)
Glucose-Capillary: 132 mg/dL — ABNORMAL HIGH (ref 70–99)
Glucose-Capillary: 135 mg/dL — ABNORMAL HIGH (ref 70–99)
Glucose-Capillary: 139 mg/dL — ABNORMAL HIGH (ref 70–99)
Glucose-Capillary: 141 mg/dL — ABNORMAL HIGH (ref 70–99)

## 2018-09-08 LAB — BPAM FFP
Blood Product Expiration Date: 202008162359
ISSUE DATE / TIME: 202008110650
Unit Type and Rh: 5100

## 2018-09-08 LAB — CBC
HCT: 25.7 % — ABNORMAL LOW (ref 36.0–46.0)
Hemoglobin: 8.1 g/dL — ABNORMAL LOW (ref 12.0–15.0)
MCH: 27.1 pg (ref 26.0–34.0)
MCHC: 31.5 g/dL (ref 30.0–36.0)
MCV: 86 fL (ref 80.0–100.0)
Platelets: 125 10*3/uL — ABNORMAL LOW (ref 150–400)
RBC: 2.99 MIL/uL — ABNORMAL LOW (ref 3.87–5.11)
RDW: 19 % — ABNORMAL HIGH (ref 11.5–15.5)
WBC: 35.2 10*3/uL — ABNORMAL HIGH (ref 4.0–10.5)
nRBC: 3.2 % — ABNORMAL HIGH (ref 0.0–0.2)

## 2018-09-08 LAB — LACTIC ACID, PLASMA: Lactic Acid, Venous: 1.8 mmol/L (ref 0.5–1.9)

## 2018-09-08 LAB — PROTIME-INR
INR: 2 — ABNORMAL HIGH (ref 0.8–1.2)
Prothrombin Time: 22.6 seconds — ABNORMAL HIGH (ref 11.4–15.2)

## 2018-09-08 LAB — HEMOGLOBIN AND HEMATOCRIT, BLOOD
HCT: 24.9 % — ABNORMAL LOW (ref 36.0–46.0)
HCT: 24.3 % — ABNORMAL LOW (ref 36.0–46.0)
Hemoglobin: 8 g/dL — ABNORMAL LOW (ref 12.0–15.0)
Hemoglobin: 7.9 g/dL — ABNORMAL LOW (ref 12.0–15.0)

## 2018-09-08 LAB — ECHOCARDIOGRAM COMPLETE
Height: 66 in
Weight: 2659.63 oz

## 2018-09-08 MED ORDER — DEXTROSE-NACL 5-0.9 % IV SOLN
INTRAVENOUS | Status: DC
  Administered 2018-09-08 – 2018-09-09 (×2): via INTRAVENOUS

## 2018-09-08 MED ORDER — SODIUM CHLORIDE 0.9% IV SOLUTION
Freq: Once | INTRAVENOUS | Status: AC
  Administered 2018-09-08: 09:00:00 via INTRAVENOUS

## 2018-09-08 NOTE — Progress Notes (Signed)
Pharmacy Antibiotic Note  Mackenzie Little is a 53 y.o. female admitted on 09/18/2018. Patient initially started on ceftriaxone, cefepime, metronidazole, and vancomycin for sepsis. Patient intubated, requiring pressors, and on multiple infusions secondary to GI bleed. During AM rounds, patient noted to have possible aspiration pneumonia and patient to be transitioned to ampicillin-sulbactam. Pharmacy has been consulted for ampicillin-sulbactam dosing.  Plan: Will initiate ampicillin-sulbactam 3g IV Q6hr.   Height: 5\' 6"  (167.6 cm) Weight: 166 lb 3.6 oz (75.4 kg) IBW/kg (Calculated) : 59.3  Temp (24hrs), Avg:98.6 F (37 C), Min:98.2 F (36.8 C), Max:98.8 F (37.1 C)  Recent Labs  Lab 09/18/2018 0130 09/06/2018 0455 09/15/2018 0827  09/04/18 0400 09/05/18 0534 09/05/18 1337 09/06/18 0356 09/06/18 1142 09/07/18 0425 09/07/18 1131 09/08/18 0405  WBC  --  20.1*  --    < > 10.6* 17.4*  --   --  26.3* 28.9*  --  35.2*  CREATININE  --  3.00*  --    < > 0.62 1.16* UNABLE TO REPORT DUE TO ICTERIC INTERFERENCE SDR 1.23*  --  1.20*  --  1.92*  LATICACIDVEN 6.6* 3.7* 2.0*  --   --   --   --   --   --   --  2.1* 1.8   < > = values in this interval not displayed.    Estimated Creatinine Clearance: 36 mL/min (A) (by C-G formula based on SCr of 1.92 mg/dL (H)).    No Known Allergies  Antimicrobials this admission: Ceftriaxone 08/05 x 1 Cefepime 08/05 >> 08/11 Vancomycin 08/05 x 1  Metronidazole 08/06 >> 08/11 Unasyn 08/11 >>  Dose adjustments this admission: n/a  Microbiology results: 08/05 BCx: no growth 08/09 BCx: no growth x 3 days 08/05 Respiratory Cx: rare candida albicans; few staph aureus 08/05 MRSA PCR: negative  Thank you for allowing pharmacy to be a part of this patient's care.  Simpson,Michael L 09/08/2018 4:49 PM

## 2018-09-08 NOTE — Progress Notes (Signed)
Pharmacy Electrolyte Monitoring Consult:  Pharmacy consulted to assist in monitoring and replacing electrolytes in this 53 y.o. female admitted on 09/14/2018 with Constipation and Chest Pain   Labs:  Sodium (mmol/L)  Date Value  09/08/2018 136   Potassium (mmol/L)  Date Value  09/08/2018 4.0   Magnesium (mg/dL)  Date Value  09/07/2018 2.1   Phosphorus (mg/dL)  Date Value  09/06/2018 4.1   Calcium (mg/dL)  Date Value  09/08/2018 6.7 (L)   Albumin (g/dL)  Date Value  09/08/2018 2.1 (L)   Corrected Calcium: 8.8  Assessment/Plan: Patient requiring mechanical intubation. Patient with past medical history including alcohol abuse. Patient ordered D5/NS at 122mL/hr to maintain glucose.   Will defer replacement today. If replacement warranted in am, will add potassium to MIVF after consulting MD.   Will replace to maintain potassium ~ 4, magnesium ~ 2, and goal phosphorus ~ 2.5-3.   Pharmacy will continue to monitor and adjust per consult.   Mackenzie Little L 09/08/2018 3:54 PM

## 2018-09-08 NOTE — Progress Notes (Signed)
GI Inpatient Follow-up Note  Subjective:  Patient seen and examined this afternoon. Per nursing, there have been no episodes of black, tarry BMs overnight and so far today. She received one unit of blood yesterday and Hgb 8.1 today. She received one unit of FFP this morning as well  And INR was 2.0. She remained intubated and sedated. She hasn't required as much Levophed today to support her blood pressure.   Scheduled Inpatient Medications:  . chlorhexidine gluconate (MEDLINE KIT)  15 mL Mouth Rinse BID  . Chlorhexidine Gluconate Cloth  6 each Topical QHS  . folic acid  1 mg Intravenous Daily  . hydrocortisone sod succinate (SOLU-CORTEF) inj  50 mg Intravenous Q6H  . mouth rinse  15 mL Mouth Rinse 10 times per day  . pantoprazole  40 mg Intravenous Q12H  . sodium chloride flush  10-40 mL Intracatheter Q12H    Continuous Inpatient Infusions:   . sodium chloride 250 mL (09/04/18 0904)  . albumin human Stopped (09/08/18 1021)  . ampicillin-sulbactam (UNASYN) IV 3 g (09/08/18 1448)  . dexmedetomidine (PRECEDEX) IV infusion Stopped (09/04/18 1111)  . dextrose 100 mL/hr at 09/08/18 1216  . fentaNYL infusion INTRAVENOUS 200 mcg/hr (09/08/18 1216)  . norepinephrine (LEVOPHED) Adult infusion 28 mcg/min (09/08/18 1216)  . octreotide  (SANDOSTATIN)    IV infusion 50 mcg/hr (09/08/18 1253)  . phytonadione (VITAMIN K) IV Stopped (09/08/18 1130)  . thiamine injection Stopped (09/08/18 0902)  . vasopressin (PITRESSIN) infusion - *FOR SHOCK* Stopped (09/07/18 0908)    PRN Inpatient Medications:  sodium chloride, acetaminophen **OR** acetaminophen, LORazepam, ondansetron **OR** ondansetron (ZOFRAN) IV, sodium chloride flush, traMADol  Review of Systems: Constitutional: Weight is stable.  Eyes: No changes in vision. ENT: No oral lesions, sore throat.  GI: see HPI.  Heme/Lymph: No easy bruising.  CV: No chest pain.  GU: No hematuria.  Integumentary: No rashes.  Neuro: No headaches.  Psych:  No depression/anxiety.  Endocrine: No heat/cold intolerance.  Allergic/Immunologic: No urticaria.  Resp: No cough, SOB.  Musculoskeletal: No joint swelling.    Physical Examination: BP (!) 118/57   Pulse 77   Temp 98.6 F (37 C) (Axillary)   Resp 15   Ht _0  (1.676 m)   Wt 75.4 kg   SpO2 96%   BMI 26.83 kg/m    Critically ill appearing female. Obtunded. HEENT:normocephalic, atraumatic Neck: supple, no JVD or thyromegaly Chest: CTA bilaterally, no wheezes, crackles, or other adventitious sounds CV: RRR, no m/g/c/r GYB:WLSLHT distended, hypoactive bowel sounds, no tenderness to palpation, palpable liver in epigastric region Ext: no edema, well perfused with 2+ pulses, Skin: no rash or lesions noted Lymph: no LAD  Data: Lab Results  Component Value Date   WBC 35.2 (H) 09/08/2018   HGB 8.0 (L) 09/08/2018   HCT 24.9 (L) 09/08/2018   MCV 86.0 09/08/2018   PLT 125 (L) 09/08/2018   Recent Labs  Lab 09/07/18 2059 09/08/18 0405 09/08/18 1227  HGB 8.4* 8.1* 8.0*   Lab Results  Component Value Date   NA 136 09/08/2018   K 4.0 09/08/2018   CL 116 (H) 09/08/2018   CO2 14 (L) 09/08/2018   BUN 41 (H) 09/08/2018   CREATININE 1.92 (H) 09/08/2018   Lab Results  Component Value Date   ALT 19 09/08/2018   AST 43 (H) 09/08/2018   ALKPHOS 67 09/08/2018   BILITOT 15.7 (H) 09/08/2018   Recent Labs  Lab 09/08/18 0405  INR 2.0*   Assessment/Plan:  53 y/o  AA female admitted for generalized weakness, melena x 2 weeks  1. Severe acute hypoxic and hypercapnic respiratory failure 2. Multilobar pneumonia - suspected 2/2 aspiration, on IV Zosyn.  3. Alcoholic hepatitis  4. Decompensated alcoholic cirrhosis - labs today show total bilirubin 14.4. Likely 2/2 cholestasis from acute illness and infection  - Hemoglobin improved today to 8.1. No signs of melena overnight or so far today - Continue IV Protonix  - Discontinue Octreotide after today since she has been on it  for 7 days  - Continue Pentoxifylline  - Continue to give FFP prn. INR needs to be <1.7 before procedures. INR is 2.0 today. - Plan for endoscopy when clinically feasible, potentially EGD on Friday - Patient's prognosis remains poor given her severe hepatic dysfunction in the setting of decompensated alcohol cirrhosis - Please call for any signs of active GI bleeding - Continue care per intensivist  - We will continue to follow along  Please call with questions or concerns.    Octavia Bruckner, PA-C [facility] [phone] (Cell)

## 2018-09-08 NOTE — Progress Notes (Signed)
[place] [facility] [place] NAME: Mackenzie Little    MR#:  [id number]  DATE OF BIRTH:  [birth year]  SUBJECTIVE:  CHIEF COMPLAINT:   Chief Complaint  Patient presents with  . Constipation  . Chest Pain  Patient seen and evaluated today Has been intubated put on ventilator by ICU attending Currently on IV pressor medication to support blood pressure On mechanical ventilator Tidal volume 500 Rate 16 FiO2 35% PEEP 5 Currently n.p.o.  REVIEW OF SYSTEMS:    ROS  On mechanical ventilator  DRUG ALLERGIES:  No Known Allergies  VITALS:  Blood pressure 114/60, pulse 80, temperature 98.6 F (37 C), temperature source Axillary, resp. rate 15, height 5\' 6"  (1.676 m), weight 75.4 kg, SpO2 94 %.  PHYSICAL EXAMINATION:   Physical Exam  GENERAL:  53 y.o.-year-old patient lying in the bed on ventilator EYES: Pupils equal, round, reactive to light and accommodation. Has scleral icterus. Extraocular muscles intact.  HEENT: Head atraumatic, normocephalic. Oropharynx endotracheal tube noted NECK:  Supple, no jugular venous distention. No thyroid enlargement, no tenderness.  LUNGS: Improved breath sounds bilaterally, scattered rales in both lungs.  On ventilator CARDIOVASCULAR: S1, S2 normal. No murmurs, rubs, or gallops.  ABDOMEN: Soft, nontender, nondistended. Bowel sounds present. No organomegaly or mass.  EXTREMITIES: No cyanosis, clubbing or edema b/l.    NEUROLOGIC: Complete nervous system exam was not possible as patient is on ventilator PSYCHIATRIC: The patient is alert and oriented x none.  SKIN: No obvious rash, lesion, or ulcer.   LABORATORY PANEL:   CBC Recent Labs  Lab 09/08/18 0405 09/08/18 1227  WBC 35.2*  --   HGB 8.1* 8.0*  HCT 25.7* 24.9*  PLT 125*  --    ------------------------------------------------------------------------------------------------------------------ Chemistries  Recent Labs  Lab 09/07/18 0425  09/08/18 0405  NA 143 136  K 3.8 4.0  CL 120* 116*  CO2 15* 14*  GLUCOSE 105* 150*  BUN 39* 41*  CREATININE 1.20* 1.92*  CALCIUM 7.0* 6.7*  MG 2.1  --   AST 36 43*  ALT 20 19  ALKPHOS 63 67  BILITOT 13.5* 15.7*   ------------------------------------------------------------------------------------------------------------------  Cardiac Enzymes No results for input(s): TROPONINI in the last 168 hours. ------------------------------------------------------------------------------------------------------------------  RADIOLOGY:  Dg Chest Port 1 View  Result Date: 09/08/2018 CLINICAL DATA:  Respiratory failure EXAM: PORTABLE CHEST 1 VIEW COMPARISON:  Chest radiograph 09/07/2018 FINDINGS: ET tube terminates 1.2 cm above the level of the carina. Stable cardiomegaly and mediastinal contours. Prominent multifocal airspace disease throughout the right lung and within the mid to lower left lung again demonstrated. Slight interval increase in opacity at the right lung base. A small right pleural effusion is difficult to exclude. No evidence of pneumothorax. No acute bony abnormality. IMPRESSION: Unchanged ET tube position. Multifocal airspace disease again demonstrated with slight interval progression in the right lung base. A small right pleural effusion is difficult to exclude. Electronically Signed   By: Kellie Simmering   On: 09/08/2018 07:56   Dg Chest Port 1 View  Result Date: 09/07/2018 CLINICAL DATA:  53 year old female with respiratory failure. Negative for COVID-19 on 08/31/2018. EXAM: PORTABLE CHEST 1 VIEW COMPARISON:  8920 and earlier. FINDINGS: Portable AP semi upright view at 0330 hours. Stable ET tube tip about 1 centimeter above the carina. The patient is mildly rotated to the right. Stable cardiomegaly and mediastinal contours. Multifocal confluent bilateral pulmonary opacity, sparing the left upper lobe. Mild progression of right lung opacity. No superimposed pneumothorax or  pleural  effusion. No acute osseous abnormality identified. Negative visible bowel gas pattern. IMPRESSION: 1. Multifocal bilateral pneumonia with mild progression in the right lung. No pleural effusion. 2. Stable ET tube. Electronically Signed   By: Genevie Ann M.D.   On: 09/07/2018 03:58     ASSESSMENT AND PLAN:   53 year old female patientwith a known history ofhypertension, alcohol abuse presented to the emergency room for generalized weakness.Patient has been fatigued for the last 3 days and noticed dark tarry stool for the last couple of days.   Patient was tired and fatigued and had labored breathing this morning and has been electively intubated and put on ventilator by intensivist.  -Hypovolemic shock and multiorgan failure -due to active GI bleed -  Initial labs with evidence of acute blood loss (Hgb 6.5), and elevated INR, elevated troponins, and evidence of AKI (Cr 2.47, GFR 25) - on vasopressors to maintain MAP >65. Status post 2U PRBC and FFP last night.   -Acute respiratory failure with hypoxia Mechanical ventilator to continue Vent bundle Intensivist follow-up  -Multilobar pneumonia Most probably secondary to aspiration Continue IV Unasyn antibiotic F/u cultures  -Acute gastrointestinal bleeding -on octreotide and Protonix drip - melena x 2 weeks, received LR, octreotide, vitamin K, and protonix  - GI is following the patient, endoscopy as per GI whenever patient is clinically stable -PRBC transfusion as needed -FFP transfusion as needed -Continue pentoxifylline  -Abnormal liver function tests Repeat LFTs Avoid hepatotoxic drugs Likely cholestasis from acute infection  -Acute symptomatic severe anemia - status post transfuse 2 units PRBC   -Acute kidney injury: Likely due to ATN due to hypotension Avoid nephrotoxic medications IV fluids and monitor renal function  -Elevated lactic acid -likely due to multiorgan failure and hypotension/dehydration Follow-up  levels Empiric broad spectrum antibiotics  -Elevated troponins  -Likely from demand ischemia  -Acute hypokalemia Potassium to be replaced aggressively  -Long-term prognosis poor  All the records are reviewed and case discussed with Care Management/Social Worker. Management plans discussed with the patient, family and they are in agreement.  CODE STATUS: Full code  DVT Prophylaxis: SCDs  TOTAL TIME TAKING CARE OF THIS PATIENT: 35 minutes.   POSSIBLE D/C IN 4 to 5 DAYS, DEPENDING ON CLINICAL CONDITION.  Saundra Shelling M.D on 09/08/2018 at 1:32 PM  Between 7am to 6pm - Pager - [phone]  After 6pm go to [email or web address] - password [email or web address] [facility]  Office  [phone]  CC: Primary care physician; Ranae Plumber, PA  Note: This dictation was prepared with Dragon dictation along with smaller phrase technology. Any transcriptional errors that result from this process are unintentional.

## 2018-09-08 NOTE — Progress Notes (Signed)
Follow up - Critical Care Medicine Note  Patient Details:    Mackenzie Little is an 53 y.o. female.  Lines, Airways, Drains: Airway 8 mm (Active)  Secured at (cm) 26 cm 09/08/18 2000  Measured From Lips 09/08/18 2000  Secured Location Right 09/08/18 1927  Secured By [employer] 09/08/18 2000  Tube Holder Repositioned Yes 09/08/18 1927  Cuff Pressure (cm H2O) 28 cm H2O 09/08/18 1927  Site Condition Dry 09/08/18 2000     CVC Triple Lumen 09/04/2018 Right Femoral (Active)  Indication for Insertion or Continuance of Line Vasoactive infusions 09/08/18 2000  Site Assessment Clean;Dry;Intact 09/08/18 2000  Proximal Lumen Status Infusing;Flushed;Blood return noted 09/08/18 2000  Medial Lumen Status Infusing;Flushed;Blood return noted 09/08/18 2000  Distal Lumen Status In-line blood sampling system in place;Blood return noted 09/08/18 2000  Dressing Type Transparent;Occlusive 09/08/18 2000  Dressing Status Clean;Dry;Intact;Antimicrobial disc in place 09/08/18 [place] checked and tightened 09/08/18 2000  Dressing Intervention Other (Comment) 09/08/18 0800  Dressing Change Due 09/08/18 09/08/18 2000     Urethral Catheter Adalberto Ill RN Latex 14 Fr. (Active)  Indication for Insertion or Continuance of Catheter Therapy based on hourly urine output monitoring and documentation for critical condition (NOT STRICT I&O) 09/08/18 2000  Site Assessment Clean;Intact 09/08/18 2000  Catheter Maintenance Bag below level of bladder;Catheter secured;Drainage bag/tubing not touching floor;Insertion date on drainage bag;No dependent loops;Seal intact 09/08/18 2000  Collection Container Standard drainage bag 09/08/18 2000  Securement Method Securing device (Describe) 09/08/18 2000  Urinary Catheter Interventions (if applicable) Unclamped 31/49/70 2000  Output (mL) 10 mL 09/08/18 2000    Anti-infectives:  Anti-infectives (From admission, onward)   Start     Dose/Rate Route  Frequency Ordered Stop   09/07/18 1600  Ampicillin-Sulbactam (UNASYN) 3 g in sodium chloride 0.9 % 100 mL IVPB     3 g 200 mL/hr over 30 Minutes Intravenous Every 6 hours 09/07/18 1101     09/06/18 0200  ceFEPIme (MAXIPIME) 2 g in sodium chloride 0.9 % 100 mL IVPB  Status:  Discontinued     2 g 200 mL/hr over 30 Minutes Intravenous Every 12 hours 09/05/18 1436 09/07/18 1042   09/04/18 1400  ceFEPIme (MAXIPIME) 2 g in sodium chloride 0.9 % 100 mL IVPB  Status:  Discontinued     2 g 200 mL/hr over 30 Minutes Intravenous Every 8 hours 09/04/18 1255 09/05/18 1436   09/03/18 1600  ceFEPIme (MAXIPIME) 2 g in sodium chloride 0.9 % 100 mL IVPB  Status:  Discontinued     2 g 200 mL/hr over 30 Minutes Intravenous Every 12 hours 09/03/18 1329 09/04/18 1255   09/19/2018 1100  metroNIDAZOLE (FLAGYL) IVPB 500 mg  Status:  Discontinued     500 mg 100 mL/hr over 60 Minutes Intravenous Every 8 hours 09/27/2018 1038 09/07/18 1042   09/24/2018 2100  vancomycin (VANCOCIN) 1,750 mg in sodium chloride 0.9 % 500 mL IVPB     1,750 mg 250 mL/hr over 120 Minutes Intravenous  Once 09/07/2018 1843 09/15/2018 2345   09/22/2018 2000  ceFEPIme (MAXIPIME) 2 g in sodium chloride 0.9 % 100 mL IVPB  Status:  Discontinued     2 g 200 mL/hr over 30 Minutes Intravenous Every 24 hours 09/18/2018 1843 09/03/18 1329   09/06/2018 1846  vancomycin variable dose per unstable renal function (pharmacist dosing)  Status:  Discontinued      Does not apply See admin instructions 09/16/2018 1846 09/01/2018 1038   09/20/2018 1400  cefTRIAXone (ROCEPHIN) 1 g in sodium chloride 0.9 % 100 mL IVPB     1 g 200 mL/hr over 30 Minutes Intravenous  Once 08/30/2018 1400 08/30/2018 1549      Microbiology: Results for orders placed or performed during the hospital encounter of 09/15/2018  Culture, blood (routine x 2)     Status: None   Collection Time: 09/20/2018  1:25 PM   Specimen: BLOOD  Result Value Ref Range Status   Specimen Description BLOOD LEFT ANTECUBITAL  Final    Special Requests   Final    BOTTLES DRAWN AEROBIC AND ANAEROBIC Blood Culture adequate volume   Culture   Final    NO GROWTH 5 DAYS Performed at [facility], [address] [place], [place] [address]    Report Status 09/06/2018 FINAL  Final  Culture, blood (routine x 2)     Status: None   Collection Time: 09/15/2018  1:39 PM   Specimen: BLOOD  Result Value Ref Range Status   Specimen Description BLOOD RIGHT ANTECUBITAL  Final   Special Requests   Final    BOTTLES DRAWN AEROBIC AND ANAEROBIC Blood Culture results may not be optimal due to an excessive volume of blood received in culture bottles   Culture   Final    NO GROWTH 5 DAYS Performed at [facility], [address], [place], [place] [address]    Report Status 09/06/2018 FINAL  Final  SARS Coronavirus 2 [facility] order, Performed in [place] hospital lab)     Status: None   Collection Time: 08/29/2018  4:22 PM  Result Value Ref Range Status   SARS Coronavirus 2 NEGATIVE NEGATIVE Final    Comment: (NOTE) SARS CoV 2 target nucleic acids are NOT DETECTED. The SARS CoV 2 RNA is generally detectable in upper and lower respiratory specimens during the acute phase of infection. The lowest concentration of SARS CoV 2 viral copies this assay can detect is 250 copies per mL. A negative result does not preclude SARS CoV 2 infection and should not be used as the sole basis for treatment or other patient management decisions. A negative result may occur with improper specimen collection and handling, submission  of specimen other than nasopharyngeal swab, presence of viral mutation(s) within the areas targeted by this assay, and inadequate number of viral copies (less than 250 copies per mL). A negative result must be combined with clinical observations, patient history,  and epidemiological information. The expected result is Negative. Fact Sheet for Patients:   https [employer] 093818 download Fact  Sheet for Healthcare Providers:   https [employer] media 534-627-2243 d Jenel Lucks This test is not yet approved or cleared by the [place] FDA and  has been authorized for detection and/or diagnosis of SARS CoV 2 by FDA under an Emergency Use Authorization (EUA).  This EUA will remain in effect (meaning this test can be used) for the duration of  the COVID19 declaration under Section 564(b)(1) of the Act, 21 U.S.C.  section 360bbb-3(b)(1), unless the authorization is terminated or revoked sooner. Performed at [facility], [address] [place],  [address]   MRSA PCR Screening     Status: None   Collection Time: 09/22/2018  8:38 PM   Specimen: Nasal Mucosa; Nasopharyngeal  Result Value Ref Range Status   MRSA by PCR NEGATIVE NEGATIVE Final    Comment:        The GeneXpert MRSA Assay (FDA approved for NASAL specimens only), is one  component of a comprehensive MRSA colonization surveillance program. It is not intended to diagnose MRSA infection nor to guide or monitor treatment for MRSA infections. Performed at [facility], [address] [place], [place] [address]   Culture, blood (Routine X 2) w Reflex to ID Panel     Status: None (Preliminary result)   Collection Time: 09/05/18  6:52 AM   Specimen: BLOOD  Result Value Ref Range Status   Specimen Description BLOOD L HAND  Final   Special Requests   Final    BOTTLES DRAWN AEROBIC AND ANAEROBIC Blood Culture adequate volume   Culture   Final    NO GROWTH 3 DAYS Performed at [facility], [address] [place], [place] [address]    Report Status PENDING  Incomplete  Culture, blood (Routine X 2) w Reflex to ID Panel     Status: None (Preliminary result)   Collection Time: 09/05/18  7:04 AM   Specimen: BLOOD  Result Value Ref Range Status   Specimen Description BLOOD L FOREARM  Final   Special Requests   Final    BOTTLES DRAWN AEROBIC AND ANAEROBIC Blood Culture adequate volume    Culture   Final    NO GROWTH 3 DAYS Performed at [facility], [address] [place], [place] [address]    Report Status PENDING  Incomplete  Culture, respiratory (non-expectorated)     Status: None (Preliminary result)   Collection Time: 09/05/18 11:42 AM   Specimen: Tracheal Aspirate; Respiratory  Result Value Ref Range Status   Specimen Description   Final    TRACHEAL ASPIRATE Performed at [facility], [address], [place], [place] [address]    Special Requests   Final    NONE Performed at [facility], [place], [place] [address]    Gram Stain   Final    FEW WBC PRESENT,BOTH PMN AND MONONUCLEAR RARE YEAST RARE GRAM POSITIVE COCCI NO SQUAMOUS EPITHELIAL CELLS PRESENT    Culture   Final    RARE CANDIDA ALBICANS FEW STAPHYLOCOCCUS AUREUS SUSCEPTIBILITIES TO FOLLOW Performed at [facility], [place] [address] [place], [place] [address]    Report Status PENDING  Incomplete    Best Practice/Protocols:  VTE Prophylaxis: Mechanical GI Prophylaxis: Proton Pump Inhibitor infusion Continous Sedation  Events: -09/10/2018: ED visit c/o melena and weakness. Hgb low, with hypotension. Started on vit K, octreotide, Protonix and IVF (LR). Brought to ICU. Central line placed. Required vasopressors.Received FFP and 2 units PRBC for coagulopathy and Hgb<7.GI consulted.  - 08/31/2018: EGD postponed, due to critical condition, per GI. Paracentesis planned, not completed due to lack of ascites on abdominal US. RUQ [place] with evidence of cirrhotic liver changes, and contracted, thickened gallbladder wall possibly due to artifact from ascites/contraction, with small gallstones. Continued to require vasopressors. Weaned precedex as tolerated.Restarted Cefepime.1 U PRBC and FFP administered. - 09/03/2018: Weaning off of pressors with IVF bolus. EGD continued to be postponed. Continued tarry stools. 2 U PRBC. -09/04/2018: Intubated with ventilary  support. Remained on precedex and vasopressors. Vaginal bleeding investigated with US pelvis - unremarkable. CXR with bilateral R>L multifocal infiltrates at bases, c/w aspiration vs. pneumonia.  -09/05/2018: Remains on pressors. Repeat CXR with unchanged extensive bil mid/lower lung patchy consolidation c/w multi-lobar pneumonia. At high risk for endoscopy and OG tube placement - per GI. -09/06/2018: Remains on pressors. IV albumin administered. Remains NPO. -8/11 started on stress dose steroids due to ongoing issues with hypotension,  switched antibiotics to Unasyn  Studies: US Pelvis (transabdominal Only)  Result Date: 09/04/2018 CLINICAL DATA:  Vaginal bleeding EXAM: TRANSABDOMINAL ULTRASOUND OF PELVIS TECHNIQUE: Transabdominal ultrasound examination of the pelvis was performed including evaluation of the uterus, ovaries, adnexal regions, and pelvic cul-de-sac. COMPARISON:  12/27/2008 FINDINGS: Uterus Measurements: 8.1 x 4.3 x 5.9 cm = volume: 109 mL. Left uterine body/fundal fibroid of 3.5 x 3.1 x 3.4 cm. Endometrium Thickness: Normal, 3 mm.  No focal abnormality visualized. Right ovary Obscured by bowel gas. Left ovary Measurements: 2.7 x 1.8 x 2.6 cm = volume: 6.7 mL. Normal appearance/no adnexal mass. Other findings:  Trace free pelvic fluid is likely physiologic. IMPRESSION: 1. Normal appearance of the endometrium. 2. Uterine fundal/body fibroid. 3. Trace free fluid. Electronically Signed   By: Abigail Miyamoto M.D.   On: 09/04/2018 13:01   US Abdomen Limited  Result Date: 08/28/2018 CLINICAL DATA:  53 year old with history of cirrhosis. Evaluate for ascites. EXAM: LIMITED ABDOMEN ULTRASOUND FOR ASCITES TECHNIQUE: Limited ultrasound survey for ascites was performed in all four abdominal quadrants. COMPARISON:  MRI 07/06/2017 FINDINGS: No ascites identified. Fluid in the urinary bladder. Evidence for fluid within bowel structures. IMPRESSION: Negative for ascites. Electronically Signed   By: Markus Daft  M.D.   On: 09/06/2018 12:01   Dg Chest Port 1 View  Result Date: 09/08/2018 CLINICAL DATA:  Respiratory failure EXAM: PORTABLE CHEST 1 VIEW COMPARISON:  Chest radiograph 09/07/2018 FINDINGS: ET tube terminates 1.2 cm above the level of the carina. Stable cardiomegaly and mediastinal contours. Prominent multifocal airspace disease throughout the right lung and within the mid to lower left lung again demonstrated. Slight interval increase in opacity at the right lung base. A small right pleural effusion is difficult to exclude. No evidence of pneumothorax. No acute bony abnormality. IMPRESSION: Unchanged ET tube position. Multifocal airspace disease again demonstrated with slight interval progression in the right lung base. A small right pleural effusion is difficult to exclude. Electronically Signed   By: Kellie Simmering   On: 09/08/2018 07:56   Dg Chest Port 1 View  Result Date: 09/07/2018 CLINICAL DATA:  53 year old female with respiratory failure. Negative for COVID-19 on 08/29/2018. EXAM: PORTABLE CHEST 1 VIEW COMPARISON:  8920 and earlier. FINDINGS: Portable AP semi upright view at 0330 hours. Stable ET tube tip about 1 centimeter above the carina. The patient is mildly rotated to the right. Stable cardiomegaly and mediastinal contours. Multifocal confluent bilateral pulmonary opacity, sparing the left upper lobe. Mild progression of right lung opacity. No superimposed pneumothorax or pleural effusion. No acute osseous abnormality identified. Negative visible bowel gas pattern. IMPRESSION: 1. Multifocal bilateral pneumonia with mild progression in the right lung. No pleural effusion. 2. Stable ET tube. Electronically Signed   By: Genevie Ann M.D.   On: 09/07/2018 03:58   Dg Chest Port 1 View  Result Date: 09/05/2018 CLINICAL DATA:  Acute respiratory failure EXAM: PORTABLE CHEST 1 VIEW COMPARISON:  Chest radiograph from one day prior. FINDINGS: Endotracheal tube tip is 2.0 cm above the carina. Stable  cardiomediastinal silhouette with top-normal heart size. No pneumothorax. No pleural effusion. Extensive patchy consolidation throughout the mid to lower lungs bilaterally, not appreciably changed. IMPRESSION: 1. Well-positioned endotracheal tube. 2. Extensive patchy consolidation throughout the mid to lower lungs bilaterally, not appreciably changed, favor multilobar pneumonia. Electronically Signed   By: Ilona Sorrel M.D.   On: 09/05/2018 07:12   Dg Chest Port 1 View  Result Date: 09/04/2018 CLINICAL DATA:  Possible aspiration.  Intubation.  EXAM: PORTABLE CHEST 1 VIEW COMPARISON:  July 06, 2017 FINDINGS: The ETT terminates 2.2 cm above the carina in good position. No pneumothorax. Bibasilar pulmonary infiltrates, right greater than left. The cardiomediastinal silhouette is stable. IMPRESSION: 1. The ETT is in good position. 2. Bilateral pulmonary infiltrates in the bases, right much greater than left may represent aspiration versus multifocal pneumonia. Recommend clinical correlation and follow-up to resolution. Electronically Signed   By: Dorise Bullion III M.D   On: 09/04/2018 12:58   US Abdomen Limited Ruq  Result Date: 09/06/2018 CLINICAL DATA:  Cirrhosis EXAM: ULTRASOUND ABDOMEN LIMITED RIGHT UPPER QUADRANT COMPARISON:  07/06/2017 FINDINGS: Gallbladder: Contracted gallbladder with thickened wall and mild pericholecystic fluid. Small echogenic within the lumen likely representing tiny calculi. Common bile duct: Diameter: 4 mm, normal Liver: Heterogeneous increased echogenicity of the liver with minimally nodular hepatic contour suggesting cirrhosis. No focal hepatic mass lesion. Hepatic veins mildly distended suggesting elevated RIGHT heart pressure. Portal vein is patent on color Doppler imaging with normal direction of blood flow towards the liver. Other: Small amount of perihepatic ascites as well as pericholecystic fluid. IMPRESSION: Contracted gallbladder with thickened gallbladder wall which may  be artifactual in the setting of contraction and ascites. Probable small gallstones. Cirrhotic appearing liver without definite mass. Electronically Signed   By: Lavonia Dana M.D.   On: 09/04/2018 13:50   Chest x-ray was independently reviewed continues to show worsening airspace disease:  Consults: Treatment Team:  Efrain Sella, MD Teodoro Spray, MD   Subjective:    Overnight Issues: 1 episode of tarry stools.  Has required 1 more unit of PRBCs and 1 unit of FFP.  On wake-up assessment she remains poorly responsive.  Objective:  Vital signs for last 24 hours: Temp:  [97.5 F (36.4 C)-98.8 F (37.1 C)] 97.5 F (36.4 C) (08/12 2000) Pulse Rate:  [71-91] 71 (08/12 2000) Resp:  [15-22] 16 (08/12 2000) BP: (99-122)/(48-60) 107/52 (08/12 2000) SpO2:  [92 %-98 %] 97 % (08/12 2000) FiO2 (%):  [35 %] 35 % (08/12 2000)  Hemodynamic parameters for last 24 hours:    Intake/Output from previous day: 08/11 0701 - 08/12 0700 In: 5325.6 [I.V.:3891.1; Blood:697.7; IV Piggyback:736.8] Out: 250 [Urine:250]  Intake/Output this shift: Total I/O In: 320.2 [I.V.:320.2] Out: 10 [Urine:10]  Vent settings for last 24 hours: Vent Mode: PRVC FiO2 (%):  [35 %] 35 % Set Rate:  [16 bmp] 16 bmp Vt Set:  [500 mL] 500 mL PEEP:  [5 cmH20] 5 cmH20 Plateau Pressure:  [20 cmH20-22 cmH20] 20 cmH20  Physical Exam:  GENERAL: Critically ill appearing, jaundiced. Intubated and sedated. HEAD: Normocephalic, atraumatic.  EYES: Pupils equal, round, constricted and minimally reactive to light. + scleral icterus.  MOUTH: Moist mucosal membrane. NECK: Supple. No JVD.  PULMONARY: Bibasilar crackles. Otherwise CTAB. CARDIOVASCULAR: S1 and S2. Regular rate and rhythm. + murmurs RU sternal border. No murmurs rubs, or gallops.  GASTROINTESTINAL:Soft,protruberant, Positive bowel sounds. EXTREMITIES: No edema. 2+ radial pulses. 1+ DP pulses. NEUROLOGIC: Patient is obtunded. Unable to follow commands. Does not  wake to voice or painful stimuli. + corneal reflex. + gag reflex. Patellar reflex hypotonic. SKIN: intact, warm, dry  Assessment/Plan:  SYNOPSIS: 53 year old female with hypovolemic shock secondary to GI bleed with resultant acute kidney injury, acute respiratory failure requiring intubation, with aspiration pneumonia, and liver failure secondary to alcoholic hepatitis, DTs  and associated encephalopathy. Prognosis remains guarded, with multi-organ failure (respiratory,kidney, liver w/ coagulopathy), and requirement of vasopressors.  ASSESSMENT AND PLAN:  Severe ACUTE Hypoxic Respiratory Failure, ventilator dependent -continue Full MV support -Wean FiO2 and PEEP as tolerated - SAT this morning, poor mentation. SBT unable to be completed due to patient's inability to follow commands,asynchrony when sedation lightened.  ACUTE GI BLEED - GI following. - Will administer 1 U PRBC. 1 U FFP - Will monitor H/H q 8 hours. Transfuse for Hgb < 7 - Possible varices. Continue Octreotide and Protonix. - Plan for endoscopy for acute GI bleed when more hemodynamically stable.  She will need GI intervention to evaluate source of bleeding and control as possible.  She is a stable as she can be made without control of the primary issue which is GI bleeding.   MULTILOBAR PNEUMONIA - likely aspiration - No interval improvement on CXR, seen above. - Continue ampicillin sulbactam for anaerobe coverage, given suspected aspiration pneumonia. -Supportive care  [facility] persistent due to vasodilation from underlying cirrhosis - use vasopressors to keep MAP>65 - Lactic acid levels normalizing - follow up blood cultures - no growth after 2 days. - Antibiotics, as above. - Cortisol levels showed level inadequate for the degree of stress. Started stress dose steroids on 8/11.  - Agressive IV fluid resuscitation,IV albumin and transfusions as needed.  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 - Will continue to monitor liver function.  NEUROLOGY - intubated and sedated - minimal sedation to achieve a RASS goal: -1 - Continue CIWA protocol, with Precedex, and Ativan PRN. - On IV thiamine.   Cardiomegaly on CXR - ECHO shows diastolic dysfunction, mild decrease LVEF however most notable is RV dilatation with poor RVEF query hepatopulmonary syndrome with RV dysfunction - This issue adds complexity to her management  GI Continue Protonix infusion  NUTRITIONAL STATUS DIET--> NPO (s/p 5 days since intubation) Cannot use GI tract due to potential NG disruption of esophageal varices if present. Will need central access for parenteral nutrition will need to decide in a.m.  ENDO - will use ICU hypoglycemic\Hyperglycemia protocol as needed -Stress dose steroids for relative adrenal insufficiency/persistent hypotension  ELECTROLYTES - Persistent hyperchloremia. Continue D5 to 113mL/hour.  - follow labs   - pharmacy consultation and following    LOS: 7 days   Additional comments: Multidisciplinary rounds were performed with ICU staff.  Arranging for family meeting to address goals of care.  The patient is critically ill with multiorgan failure and poor long-term prognosis overall.  Critical Care Total Time*: 40 Minutes  C. Derrill Kay, MD [place] [facility] 09/08/2018  *Care during the described time interval was provided by me and/or other providers on the critical care team.  I have reviewed this patient's available data, including medical history, events of note, physical examination and test results as part of my evaluation.

## 2018-09-09 ENCOUNTER — Inpatient Hospital Stay: Payer: Self-pay

## 2018-09-09 DIAGNOSIS — R578 Other shock: Secondary | ICD-10-CM

## 2018-09-09 DIAGNOSIS — Z515 Encounter for palliative care: Secondary | ICD-10-CM

## 2018-09-09 LAB — TYPE AND SCREEN
ABO/RH(D): O POS
Antibody Screen: NEGATIVE
Unit division: 0

## 2018-09-09 LAB — CULTURE, RESPIRATORY W GRAM STAIN

## 2018-09-09 LAB — BPAM FFP
Blood Product Expiration Date: 202008172359
ISSUE DATE / TIME: 202008120845
Unit Type and Rh: 7300

## 2018-09-09 LAB — HEMOGLOBIN AND HEMATOCRIT, BLOOD
HCT: 23 % — ABNORMAL LOW (ref 36.0–46.0)
HCT: 23.1 % — ABNORMAL LOW (ref 36.0–46.0)
Hemoglobin: 7.3 g/dL — ABNORMAL LOW (ref 12.0–15.0)
Hemoglobin: 7.4 g/dL — ABNORMAL LOW (ref 12.0–15.0)

## 2018-09-09 LAB — BASIC METABOLIC PANEL
Anion gap: 8 (ref 5–15)
BUN: 51 mg/dL — ABNORMAL HIGH (ref 6–20)
CO2: 13 mmol/L — ABNORMAL LOW (ref 22–32)
Calcium: 7 mg/dL — ABNORMAL LOW (ref 8.9–10.3)
Chloride: 116 mmol/L — ABNORMAL HIGH (ref 98–111)
Creatinine, Ser: 2.72 mg/dL — ABNORMAL HIGH (ref 0.44–1.00)
GFR calc Af Amer: 23 mL/min — ABNORMAL LOW (ref >60)
GFR calc non Af Amer: 19 mL/min — ABNORMAL LOW (ref >60)
Glucose, Bld: 126 mg/dL — ABNORMAL HIGH (ref 70–99)
Potassium: 3.9 mmol/L (ref 3.5–5.1)
Sodium: 137 mmol/L (ref 135–145)

## 2018-09-09 LAB — CBC
HCT: 23.8 % — ABNORMAL LOW (ref 36.0–46.0)
Hemoglobin: 7.5 g/dL — ABNORMAL LOW (ref 12.0–15.0)
MCH: 26.9 pg (ref 26.0–34.0)
MCHC: 31.5 g/dL (ref 30.0–36.0)
MCV: 85.3 fL (ref 80.0–100.0)
Platelets: 96 10*3/uL — ABNORMAL LOW (ref 150–400)
RBC: 2.79 MIL/uL — ABNORMAL LOW (ref 3.87–5.11)
RDW: 19.2 % — ABNORMAL HIGH (ref 11.5–15.5)
WBC: 27.6 10*3/uL — ABNORMAL HIGH (ref 4.0–10.5)
nRBC: 2 % — ABNORMAL HIGH (ref 0.0–0.2)

## 2018-09-09 LAB — PREPARE FRESH FROZEN PLASMA: Unit division: 0

## 2018-09-09 LAB — GLUCOSE, CAPILLARY
Glucose-Capillary: 109 mg/dL — ABNORMAL HIGH (ref 70–99)
Glucose-Capillary: 111 mg/dL — ABNORMAL HIGH (ref 70–99)
Glucose-Capillary: 113 mg/dL — ABNORMAL HIGH (ref 70–99)
Glucose-Capillary: 115 mg/dL — ABNORMAL HIGH (ref 70–99)
Glucose-Capillary: 119 mg/dL — ABNORMAL HIGH (ref 70–99)
Glucose-Capillary: 96 mg/dL (ref 70–99)

## 2018-09-09 LAB — BPAM RBC
Blood Product Expiration Date: 202008132359
ISSUE DATE / TIME: 202008111548
Unit Type and Rh: 5100

## 2018-09-09 LAB — PROTIME-INR
INR: 2.2 — ABNORMAL HIGH (ref 0.8–1.2)
INR: 2.1 — ABNORMAL HIGH (ref 0.8–1.2)
Prothrombin Time: 23.7 seconds — ABNORMAL HIGH (ref 11.4–15.2)
Prothrombin Time: 23.1 seconds — ABNORMAL HIGH (ref 11.4–15.2)

## 2018-09-09 LAB — MAGNESIUM: Magnesium: 2.1 mg/dL (ref 1.7–2.4)

## 2018-09-09 LAB — TSH: TSH: 0.126 u[IU]/mL — ABNORMAL LOW (ref 0.350–4.500)

## 2018-09-09 LAB — PHOSPHORUS: Phosphorus: 5.1 mg/dL — ABNORMAL HIGH (ref 2.5–4.6)

## 2018-09-09 MED ORDER — SODIUM CHLORIDE 0.9 % IV SOLN: INTRAVENOUS | Status: DC

## 2018-09-09 MED ORDER — SODIUM CHLORIDE 0.9% FLUSH: 10.0000 mL | INTRAVENOUS | Status: DC | PRN

## 2018-09-09 MED ORDER — DEXTROSE IN LACTATED RINGERS 5 % IV SOLN
INTRAVENOUS | Status: DC
  Administered 2018-09-09 – 2018-09-10 (×3): via INTRAVENOUS

## 2018-09-09 MED ORDER — SODIUM CHLORIDE 0.9 % IV SOLN
INTRAVENOUS | Status: DC
  Administered 2018-09-10: 09:00:00 via INTRAVENOUS

## 2018-09-09 MED ORDER — SODIUM CHLORIDE 0.9% FLUSH
10.0000 mL | Freq: Two times a day (BID) | INTRAVENOUS | Status: DC
  Administered 2018-09-09 – 2018-09-10 (×4): 10 mL
  Administered 2018-09-11: 20 mL

## 2018-09-09 MED ORDER — SODIUM CHLORIDE 0.9% IV SOLUTION
Freq: Once | INTRAVENOUS | Status: AC
  Administered 2018-09-09: 04:00:00 via INTRAVENOUS

## 2018-09-09 MED ORDER — SODIUM CHLORIDE 0.9 % IV SOLN
3.0000 g | Freq: Two times a day (BID) | INTRAVENOUS | Status: DC
  Administered 2018-09-09 – 2018-09-11 (×4): 3 g via INTRAVENOUS
  Filled 2018-09-09: qty 8
  Filled 2018-09-09 (×3): qty 3
  Filled 2018-09-09: qty 8
  Filled 2018-09-09: qty 3

## 2018-09-09 NOTE — Progress Notes (Signed)
[facility] [place] NAME: Mackenzie Little    MR#:  [id number]  DATE OF BIRTH:  1966-01-08  SUBJECTIVE:  CHIEF COMPLAINT:   Chief Complaint  Patient presents with  . Constipation  . Chest Pain  Patient seen and evaluated today Has been intubated put on ventilator by ICU attending Currently on IV pressor medication to support blood pressure On mechanical ventilator Tidal volume 500 Rate 16 FiO2 28% PEEP 5 Currently n.p.o.  REVIEW OF SYSTEMS:    ROS  On mechanical ventilator  DRUG ALLERGIES:  No Known Allergies  VITALS:  Blood pressure (!) 109/55, pulse 76, temperature 98.2 F (36.8 C), temperature source Oral, resp. rate 15, height 5\' 6"  (1.676 m), weight 75.4 kg, SpO2 97 %.  PHYSICAL EXAMINATION:   Physical Exam  GENERAL:  53 y.o.-year-old patient lying in the bed on ventilator EYES: Pupils equal, round, reactive to light and accommodation. Has scleral icterus. Extraocular muscles intact.  HEENT: Head atraumatic, normocephalic. Oropharynx endotracheal tube noted NECK:  Supple, no jugular venous distention. No thyroid enlargement, no tenderness.  LUNGS: Improved breath sounds bilaterally, scattered rales in both lungs.  On ventilator CARDIOVASCULAR: S1, S2 normal. No murmurs, rubs, or gallops.  ABDOMEN: Soft, nontender, nondistended. Bowel sounds present. No organomegaly or mass.  EXTREMITIES: No cyanosis, clubbing or edema b/l.    NEUROLOGIC: Complete nervous system exam was not possible as patient is on ventilator PSYCHIATRIC: The patient is alert and oriented x none.  SKIN: No obvious rash, lesion, or ulcer.   LABORATORY PANEL:   CBC Recent Labs  Lab 09/09/18 0322  WBC 27.6*  HGB 7.5*  HCT 23.8*  PLT 96*   ------------------------------------------------------------------------------------------------------------------ Chemistries  Recent Labs  Lab 09/08/18 0405 09/09/18 0322  NA 136 137  K 4.0 3.9  CL  116* 116*  CO2 14* 13*  GLUCOSE 150* 126*  BUN 41* 51*  CREATININE 1.92* 2.72*  CALCIUM 6.7* 7.0*  MG  --  2.1  AST 43*  --   ALT 19  --   ALKPHOS 67  --   BILITOT 15.7*  --    ------------------------------------------------------------------------------------------------------------------  Cardiac Enzymes No results for input(s): TROPONINI in the last 168 hours. ------------------------------------------------------------------------------------------------------------------  RADIOLOGY:  Dg Chest Port 1 View  Result Date: 09/08/2018 CLINICAL DATA:  Respiratory failure EXAM: PORTABLE CHEST 1 VIEW COMPARISON:  Chest radiograph 09/07/2018 FINDINGS: ET tube terminates 1.2 cm above the level of the carina. Stable cardiomegaly and mediastinal contours. Prominent multifocal airspace disease throughout the right lung and within the mid to lower left lung again demonstrated. Slight interval increase in opacity at the right lung base. A small right pleural effusion is difficult to exclude. No evidence of pneumothorax. No acute bony abnormality. IMPRESSION: Unchanged ET tube position. Multifocal airspace disease again demonstrated with slight interval progression in the right lung base. A small right pleural effusion is difficult to exclude. Electronically Signed   By: Kellie Simmering   On: 09/08/2018 07:56   [place] Ekg Site Rite  Result Date: 09/09/2018 If Site Rite image not attached, placement could not be confirmed due to current cardiac rhythm.    ASSESSMENT AND PLAN:   53 year old female patientwith a known history ofhypertension, alcohol abuse presented to the emergency room for generalized weakness.Patient has been fatigued for the last 3 days and noticed dark tarry stool for the last couple of days.   Patient was tired and fatigued and had labored breathing this morning and has been electively  intubated and put on ventilator by intensivist.  -Hypovolemic shock and multiorgan failure  -due to active GI bleed -  Initial labs with evidence of acute blood loss (Hgb 6.5), and elevated INR, elevated troponins, and evidence of AKI (Cr 2.47, GFR 25) - on vasopressors to maintain MAP >65. Status post 2U PRBC and FFP last night.   -Acute respiratory failure with hypoxia Mechanical ventilator to continue Vent bundle Intensivist follow-up  -Multilobar pneumonia Most probably secondary to aspiration Continue IV Unasyn antibiotic F/u cultures  -Acute gastrointestinal bleeding -on octreotide and Protonix drip - melena x 2 weeks, received LR, octreotide, vitamin K, and protonix  - GI is following the patient, endoscopy as per GI whenever patient is clinically stable -PRBC transfusion as needed -FFP transfusion as needed -Continue pentoxifylline  -Abnormal liver function tests Repeat LFTs Avoid hepatotoxic drugs Likely cholestasis from acute infection  -Acute symptomatic severe anemia - status post transfuse 2 units PRBC   -Acute kidney injury: Likely due to ATN due to hypotension Avoid nephrotoxic medications IV fluids and monitor renal function  -Elevated troponins  -Likely from demand ischemia  -Acute hypokalemia Potassium to be replaced aggressively  -Long-term prognosis poor  All the records are reviewed and case discussed with Care Management/Social Worker. Management plans discussed with the patient, family and they are in agreement.  CODE STATUS: Full code  DVT Prophylaxis: SCDs  TOTAL TIME TAKING CARE OF THIS PATIENT: 33 minutes.   POSSIBLE D/C IN 4 to 5 DAYS, DEPENDING ON CLINICAL CONDITION.  Saundra Shelling M.D on 09/09/2018 at 12:02 PM  Between 7am to 6pm - Pager - [phone]  After 6pm go to [email or web address] - password [email or web address] [facility]  Office  [phone]  CC: Primary care physician; Ranae Plumber, PA  Note: This dictation was prepared with Dragon dictation along with smaller phrase technology. Any  transcriptional errors that result from this process are unintentional.

## 2018-09-09 NOTE — Progress Notes (Signed)
Follow up - Critical Care Medicine Note  Patient Details:    Mackenzie Little is an 53 y.o. female. 53 y.o. 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 GI is following with tentative plan for EGD when stable. Lines, Airways, Drains: Airway 8 mm (Active)  Secured at (cm) 26 cm 09/08/18 2000  Measured From Lips 09/08/18 2000  Secured Location Right 09/08/18 1927  Secured By [employer] 09/08/18 2000  Tube Holder Repositioned Yes 09/08/18 1927  Cuff Pressure (cm H2O) 28 cm H2O 09/08/18 1927  Site Condition Dry 09/08/18 2000     CVC Triple Lumen 09/14/2018 Right Femoral (Active)  Indication for Insertion or Continuance of Line Vasoactive infusions 09/08/18 2000  Site Assessment Clean;Dry;Intact 09/08/18 2000  Proximal Lumen Status Infusing;Flushed;Blood return noted 09/08/18 2000  Medial Lumen Status Infusing;Flushed;Blood return noted 09/08/18 2000  Distal Lumen Status In-line blood sampling system in place;Blood return noted 09/08/18 2000  Dressing Type Transparent;Occlusive 09/08/18 2000  Dressing Status Clean;Dry;Intact;Antimicrobial disc in place 09/08/18 [place] checked and tightened 09/08/18 2000  Dressing Intervention Other (Comment) 09/08/18 0800  Dressing Change Due 09/08/18 09/08/18 2000     Urethral Catheter Adalberto Ill RN Latex 14 Fr. (Active)  Indication for Insertion or Continuance of Catheter Therapy based on hourly urine output monitoring and documentation for critical condition (NOT STRICT I&O) 09/08/18 2000  Site Assessment Clean;Intact 09/08/18 2000  Catheter Maintenance Bag below level of bladder;Catheter secured;Drainage bag/tubing not touching floor;Insertion date on drainage bag;No dependent loops;Seal intact 09/08/18 2000  Collection Container Standard drainage bag 09/08/18 2000  Securement Method  Securing device (Describe) 09/08/18 2000  Urinary Catheter Interventions (if applicable) Unclamped 34/74/25 2000  Output (mL) 10 mL 09/08/18 2000    Anti-infectives:  Anti-infectives (From admission, onward)   Start     Dose/Rate Route Frequency Ordered Stop   09/09/18 1800  Ampicillin-Sulbactam (UNASYN) 3 g in sodium chloride 0.9 % 100 mL IVPB     3 g 200 mL/hr over 30 Minutes Intravenous Every 12 hours 09/09/18 0601     09/07/18 1600  Ampicillin-Sulbactam (UNASYN) 3 g in sodium chloride 0.9 % 100 mL IVPB  Status:  Discontinued     3 g 200 mL/hr over 30 Minutes Intravenous Every 6 hours 09/07/18 1101 09/09/18 0601   09/06/18 0200  ceFEPIme (MAXIPIME) 2 g in sodium chloride 0.9 % 100 mL IVPB  Status:  Discontinued     2 g 200 mL/hr over 30 Minutes Intravenous Every 12 hours 09/05/18 1436 09/07/18 1042   09/04/18 1400  ceFEPIme (MAXIPIME) 2 g in sodium chloride 0.9 % 100 mL IVPB  Status:  Discontinued     2 g 200 mL/hr over 30 Minutes Intravenous Every 8 hours 09/04/18 1255 09/05/18 1436   09/03/18 1600  ceFEPIme (MAXIPIME) 2 g in sodium chloride 0.9 % 100 mL IVPB  Status:  Discontinued     2 g 200 mL/hr over 30 Minutes Intravenous Every 12 hours 09/03/18 1329 09/04/18 1255   08/29/2018 1100  metroNIDAZOLE (FLAGYL) IVPB 500 mg  Status:  Discontinued     500 mg 100 mL/hr over 60 Minutes Intravenous Every 8 hours 09/10/2018 1038 09/07/18 1042   09/09/2018 2100  vancomycin (VANCOCIN) 1,750 mg in sodium chloride 0.9 % 500 mL IVPB     1,750 mg 250 mL/hr over 120 Minutes Intravenous  Once 09/15/2018 1843  09/05/2018 2345   08/31/2018 2000  ceFEPIme (MAXIPIME) 2 g in sodium chloride 0.9 % 100 mL IVPB  Status:  Discontinued     2 g 200 mL/hr over 30 Minutes Intravenous Every 24 hours 09/12/2018 1843 09/03/18 1329   09/18/2018 1846  vancomycin variable dose per unstable renal function (pharmacist dosing)  Status:  Discontinued      Does not apply See admin instructions 09/03/2018 1846 09/03/2018 1038   08/30/2018  1400  cefTRIAXone (ROCEPHIN) 1 g in sodium chloride 0.9 % 100 mL IVPB     1 g 200 mL/hr over 30 Minutes Intravenous  Once 08/29/2018 1400 09/08/2018 1549      Microbiology: Results for orders placed or performed during the hospital encounter of 09/26/2018  Culture, blood (routine x 2)     Status: None   Collection Time: 09/16/2018  1:25 PM   Specimen: BLOOD  Result Value Ref Range Status   Specimen Description BLOOD LEFT ANTECUBITAL  Final   Special Requests   Final    BOTTLES DRAWN AEROBIC AND ANAEROBIC Blood Culture adequate volume   Culture   Final    NO GROWTH 5 DAYS Performed at [facility], [address] [place], [place] [address]    Report Status 09/06/2018 FINAL  Final  Culture, blood (routine x 2)     Status: None   Collection Time: 09/16/2018  1:39 PM   Specimen: BLOOD  Result Value Ref Range Status   Specimen Description BLOOD RIGHT ANTECUBITAL  Final   Special Requests   Final    BOTTLES DRAWN AEROBIC AND ANAEROBIC Blood Culture results may not be optimal due to an excessive volume of blood received in culture bottles   Culture   Final    NO GROWTH 5 DAYS Performed at [facility], [address], [place], [place] [address]    Report Status 09/06/2018 FINAL  Final  SARS Coronavirus 2 [facility] order, Performed in [place] hospital lab)     Status: None   Collection Time: 09/24/2018  4:22 PM  Result Value Ref Range Status   SARS Coronavirus 2 NEGATIVE NEGATIVE Final    Comment: (NOTE) SARS CoV 2 target nucleic acids are NOT DETECTED. The SARS CoV 2 RNA is generally detectable in upper and lower respiratory specimens during the acute phase of infection. The lowest concentration of SARS CoV 2 viral copies this assay can detect is 250 copies per mL. A negative result does not preclude SARS CoV 2 infection and should not be used as the sole basis for treatment or other patient management decisions. A negative result may occur with improper specimen  collection and handling, submission  of specimen other than nasopharyngeal swab, presence of viral mutation(s) within the areas targeted by this assay, and inadequate number of viral copies (less than 250 copies per mL). A negative result must be combined with clinical observations, patient history,  and epidemiological information. The expected result is Negative. Fact Sheet for Patients:   https [employer] [address] download Fact Sheet for Healthcare Providers:   https [employer] 401-604-6036 d Jenel Lucks This test is not yet approved or cleared by the [place] FDA and  has been authorized for detection and/or diagnosis of SARS CoV 2 by FDA under an Emergency Use Authorization (EUA).  This EUA will remain in effect (meaning this test can be used) for the duration of  the COVID19 declaration under Section 564(b)(1) of the Act, 21 U.S.C.  section 360bbb-3(b)(1), unless the  authorization is terminated or revoked sooner. Performed at [facility], [address] [place], [place] [address]   MRSA PCR Screening     Status: None   Collection Time: 09/26/2018  8:38 PM   Specimen: Nasal Mucosa; Nasopharyngeal  Result Value Ref Range Status   MRSA by PCR NEGATIVE NEGATIVE Final    Comment:        The GeneXpert MRSA Assay (FDA approved for NASAL specimens only), is one component of a comprehensive MRSA colonization surveillance program. It is not intended to diagnose MRSA infection nor to guide or monitor treatment for MRSA infections. Performed at [facility], [address] [place], [place] [address]   Culture, blood (Routine X 2) w Reflex to ID Panel     Status: None (Preliminary result)   Collection Time: 09/05/18  6:52 AM   Specimen: BLOOD  Result Value Ref Range Status   Specimen Description BLOOD L HAND  Final   Special Requests   Final    BOTTLES DRAWN AEROBIC AND ANAEROBIC Blood Culture adequate volume   Culture   Final    NO GROWTH 4  DAYS Performed at [facility], [address] [place], [place] [address]    Report Status PENDING  Incomplete  Culture, blood (Routine X 2) w Reflex to ID Panel     Status: None (Preliminary result)   Collection Time: 09/05/18  7:04 AM   Specimen: BLOOD  Result Value Ref Range Status   Specimen Description BLOOD L FOREARM  Final   Special Requests   Final    BOTTLES DRAWN AEROBIC AND ANAEROBIC Blood Culture adequate volume   Culture   Final    NO GROWTH 4 DAYS Performed at [facility], [address] [place],  [address]    Report Status PENDING  Incomplete  Culture, respiratory (non-expectorated)     Status: None   Collection Time: 09/05/18 11:42 AM   Specimen: Tracheal Aspirate; Respiratory  Result Value Ref Range Status   Specimen Description TRACHEAL ASPIRATE  Final   Special Requests NONE  Final   Gram Stain   Final    FEW WBC PRESENT,BOTH PMN AND MONONUCLEAR RARE YEAST RARE GRAM POSITIVE COCCI NO SQUAMOUS EPITHELIAL CELLS PRESENT    Culture RARE CANDIDA ALBICANS FEW STAPHYLOCOCCUS AUREUS   Final   Report Status 09/09/2018 FINAL  Final   Organism ID, Bacteria STAPHYLOCOCCUS AUREUS  Final      Susceptibility   Staphylococcus aureus - MIC*    CIPROFLOXACIN >=8 RESISTANT Resistant     ERYTHROMYCIN >=8 RESISTANT Resistant     GENTAMICIN <=0.5 SENSITIVE Sensitive     OXACILLIN 0.5 SENSITIVE Sensitive     TETRACYCLINE <=1 SENSITIVE Sensitive     VANCOMYCIN <=0.5 SENSITIVE Sensitive     TRIMETH/SULFA <=10 SENSITIVE Sensitive     CLINDAMYCIN <=0.25 SENSITIVE Sensitive     RIFAMPIN <=0.5 SENSITIVE Sensitive     Inducible Clindamycin NEGATIVE Sensitive     * FEW STAPHYLOCOCCUS AUREUS    Best Practice/Protocols:  VTE Prophylaxis: Mechanical GI Prophylaxis: Proton Pump Inhibitor infusion Continous Sedation--> Intermittent sedation  Events: -08/28/2018: ED visit c/o melena and weakness. Hgb low, with hypotension. Started on vit K, octreotide,  Protonix and IVF (LR). Brought to ICU. Central line placed. Required vasopressors.Received FFP and 2 units PRBC for coagulopathy and Hgb<7.GI consulted.  - 09/08/2018: EGD postponed, due to critical condition, per GI. Paracentesis planned, not completed due to lack of ascites on abdominal US. RUQ [place]  with evidence of cirrhotic liver changes, and contracted, thickened gallbladder wall possibly due to artifact from ascites/contraction, with small gallstones. Continued to require vasopressors. Weaned precedex as tolerated.Restarted Cefepime.1 U PRBC and FFP administered. - 09/03/2018: Weaning off of pressors with IVF bolus. EGD continued to be postponed. Continued tarry stools. 2 U PRBC. -09/04/2018: Intubated with ventilary support. Remained on precedex and vasopressors. Vaginal bleeding investigated with US pelvis - unremarkable. CXR with bilateral R>L multifocal infiltrates at bases, c/w aspiration vs. pneumonia.  -09/05/2018: Remains on pressors. Repeat CXR with unchanged extensive bil mid/lower lung patchy consolidation c/w multi-lobar pneumonia. At high risk for endoscopy and OG tube placement - per GI. -09/06/2018: Remains on pressors. IV albumin administered. Remains NPO. -8/11 started on stress dose steroids due to ongoing issues with hypotension, switched antibiotics to Unasyn  Studies: US Pelvis (transabdominal Only)  Result Date: 09/04/2018 CLINICAL DATA:  Vaginal bleeding EXAM: TRANSABDOMINAL ULTRASOUND OF PELVIS TECHNIQUE: Transabdominal ultrasound examination of the pelvis was performed including evaluation of the uterus, ovaries, adnexal regions, and pelvic cul-de-sac. COMPARISON:  12/27/2008 FINDINGS: Uterus Measurements: 8.1 x 4.3 x 5.9 cm = volume: 109 mL. Left uterine body/fundal fibroid of 3.5 x 3.1 x 3.4 cm. Endometrium Thickness: Normal, 3 mm.  No focal abnormality visualized. Right ovary Obscured by bowel gas. Left ovary Measurements: 2.7 x 1.8 x 2.6 cm = volume: 6.7 mL. Normal  appearance/no adnexal mass. Other findings:  Trace free pelvic fluid is likely physiologic. IMPRESSION: 1. Normal appearance of the endometrium. 2. Uterine fundal/body fibroid. 3. Trace free fluid. Electronically Signed   By: Abigail Miyamoto M.D.   On: 09/04/2018 13:01   US Abdomen Limited  Result Date: 09/04/2018 CLINICAL DATA:  53 year old with history of cirrhosis. Evaluate for ascites. EXAM: LIMITED ABDOMEN ULTRASOUND FOR ASCITES TECHNIQUE: Limited ultrasound survey for ascites was performed in all four abdominal quadrants. COMPARISON:  MRI 07/06/2017 FINDINGS: No ascites identified. Fluid in the urinary bladder. Evidence for fluid within bowel structures. IMPRESSION: Negative for ascites. Electronically Signed   By: Markus Daft M.D.   On: 09/16/2018 12:01   Dg Chest Port 1 View  Result Date: 09/08/2018 CLINICAL DATA:  Respiratory failure EXAM: PORTABLE CHEST 1 VIEW COMPARISON:  Chest radiograph 09/07/2018 FINDINGS: ET tube terminates 1.2 cm above the level of the carina. Stable cardiomegaly and mediastinal contours. Prominent multifocal airspace disease throughout the right lung and within the mid to lower left lung again demonstrated. Slight interval increase in opacity at the right lung base. A small right pleural effusion is difficult to exclude. No evidence of pneumothorax. No acute bony abnormality. IMPRESSION: Unchanged ET tube position. Multifocal airspace disease again demonstrated with slight interval progression in the right lung base. A small right pleural effusion is difficult to exclude. Electronically Signed   By: Kellie Simmering   On: 09/08/2018 07:56   Dg Chest Port 1 View  Result Date: 09/07/2018 CLINICAL DATA:  53 year old female with respiratory failure. Negative for COVID-19 on 08/28/2018. EXAM: PORTABLE CHEST 1 VIEW COMPARISON:  8920 and earlier. FINDINGS: Portable AP semi upright view at 0330 hours. Stable ET tube tip about 1 centimeter above the carina. The patient is mildly rotated  to the right. Stable cardiomegaly and mediastinal contours. Multifocal confluent bilateral pulmonary opacity, sparing the left upper lobe. Mild progression of right lung opacity. No superimposed pneumothorax or pleural effusion. No acute osseous abnormality identified. Negative visible bowel gas pattern. IMPRESSION: 1. Multifocal bilateral pneumonia with mild progression in the right lung. No pleural effusion. 2. Stable  ET tube. Electronically Signed   By: Genevie Ann M.D.   On: 09/07/2018 03:58   Dg Chest Port 1 View  Result Date: 09/05/2018 CLINICAL DATA:  Acute respiratory failure EXAM: PORTABLE CHEST 1 VIEW COMPARISON:  Chest radiograph from one day prior. FINDINGS: Endotracheal tube tip is 2.0 cm above the carina. Stable cardiomediastinal silhouette with top-normal heart size. No pneumothorax. No pleural effusion. Extensive patchy consolidation throughout the mid to lower lungs bilaterally, not appreciably changed. IMPRESSION: 1. Well-positioned endotracheal tube. 2. Extensive patchy consolidation throughout the mid to lower lungs bilaterally, not appreciably changed, favor multilobar pneumonia. Electronically Signed   By: Ilona Sorrel M.D.   On: 09/05/2018 07:12   Dg Chest Port 1 View  Result Date: 09/04/2018 CLINICAL DATA:  Possible aspiration.  Intubation. EXAM: PORTABLE CHEST 1 VIEW COMPARISON:  July 06, 2017 FINDINGS: The ETT terminates 2.2 cm above the carina in good position. No pneumothorax. Bibasilar pulmonary infiltrates, right greater than left. The cardiomediastinal silhouette is stable. IMPRESSION: 1. The ETT is in good position. 2. Bilateral pulmonary infiltrates in the bases, right much greater than left may represent aspiration versus multifocal pneumonia. Recommend clinical correlation and follow-up to resolution. Electronically Signed   By: Dorise Bullion III M.D   On: 09/04/2018 12:58   [place] Ekg Site Rite  Result Date: 09/09/2018 If Site Rite image not attached, placement could not be  confirmed due to current cardiac rhythm.  US Abdomen Limited Ruq  Result Date: 09/06/2018 CLINICAL DATA:  Cirrhosis EXAM: ULTRASOUND ABDOMEN LIMITED RIGHT UPPER QUADRANT COMPARISON:  07/06/2017 FINDINGS: Gallbladder: Contracted gallbladder with thickened wall and mild pericholecystic fluid. Small echogenic within the lumen likely representing tiny calculi. Common bile duct: Diameter: 4 mm, normal Liver: Heterogeneous increased echogenicity of the liver with minimally nodular hepatic contour suggesting cirrhosis. No focal hepatic mass lesion. Hepatic veins mildly distended suggesting elevated RIGHT heart pressure. Portal vein is patent on color Doppler imaging with normal direction of blood flow towards the liver. Other: Small amount of perihepatic ascites as well as pericholecystic fluid. IMPRESSION: Contracted gallbladder with thickened gallbladder wall which may be artifactual in the setting of contraction and ascites. Probable small gallstones. Cirrhotic appearing liver without definite mass. Electronically Signed   By: Lavonia Dana M.D.   On: 09/04/2018 13:50    Consults: Treatment Team:  Efrain Sella, MD Teodoro Spray, MD   Subjective:    Overnight Issues: Continues to require transfusions.  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  Today will be day 3 of vitamin K.  Receiving intermittent FFP.  Objective:  Vital signs for last 24 hours: Temp:  [97.4 F (36.3 C)-98.6 F (37 C)] 98 F (36.7 C) (08/13 1300) Pulse Rate:  [60-79] 79 (08/13 1300) Resp:  [14-19] 19 (08/13 1300) BP: (99-124)/(51-60) 117/56 (08/13 1300) SpO2:  [96 %-100 %] 98 % (08/13 1300) FiO2 (%):  [28 %-35 %] 28 % (08/13 1200)  Hemodynamic parameters for last 24 hours:    Intake/Output from previous day: 08/12 0701 - 08/13 0700 In: 4510.3 [I.V.:3415.7; Blood:316.5; IV Piggyback:778.1] Out: 155 [Urine:155]  Intake/Output this shift: Total I/O In: 1457.3 [I.V.:1126.3; Blood:231; IV  Piggyback:100] Out: 20 [Urine:20]  Vent settings for last 24 hours: Vent Mode: PRVC FiO2 (%):  [28 %-35 %] 28 % Set Rate:  [16 bmp] 16 bmp Vt Set:  [500 mL] 500 mL PEEP:  [5 cmH20] 5 cmH20 Plateau Pressure:  [19 FYB01-75 cmH20] 20 cmH20  Physical Exam:  GENERAL: Critically ill  appearing, jaundiced. Intubated and obtunded HEAD: Normocephalic, atraumatic.  EYES: Pupils equal, round, constricted and minimally reactive to light. + scleral icterus.  MOUTH: Moist mucosal membrane. NECK: Supple. No JVD.  PULMONARY: Bibasilar crackles.  No wheezes. CARDIOVASCULAR: S1 and S2. Regular rate and rhythm. + murmur RU sternal border. No other murmurs, rubs, or gallops.  GASTROINTESTINAL:Soft,protruberant, Positive bowel sounds. EXTREMITIES: Anasarca +2. 2+ radial pulses. 1+ DP pulses. NEUROLOGIC: Patient is obtunded. Unable to follow commands. Does not wake to voice or painful stimuli. + corneal reflex. + gag reflex. Patellar reflex hypotonic. SKIN: intact, warm, dry  Assessment/Plan:  SYNOPSIS: 53 year old female with hypovolemic shock secondary to GI bleed with resultant acute kidney injury, acute respiratory failure requiring intubation, with aspiration pneumonia, and liver failure secondary to alcoholic hepatitis, DTs  and associated encephalopathy. Prognosis remains guarded, with multi-organ failure (respiratory,kidney, liver w/ coagulopathy), and requirement of vasopressors.  ASSESSMENT AND PLAN:  Severe ACUTE Hypoxic Respiratory Failure, ventilator dependent -continue Full MV support -Wean FiO2 and PEEP as tolerated - SAT this morning, poor mentation. SBT unable to be completed due to patient's inability to follow commands, no asynchrony today.  ACUTE GI BLEED - GI following. - Will transfuse PRBCs and FFP as necessary - Continue to monitor H/H q 8 hours. Transfuse for Hgb < 7 - Possible varices. Continue Octreotide and Protonix. - Plan for endoscopy for acute GI bleed when more  hemodynamically stable.  She will need GI intervention to evaluate source of bleeding and control as possible.  She is a stable as she can be made without control of the primary issue which is GI bleeding.   MULTILOBAR PNEUMONIA - likely aspiration - Continue ampicillin sulbactam for anaerobe coverage, given suspected aspiration pneumonia. -Supportive care  [facility] persistent due to vasodilation from underlying cirrhosis - use vasopressors to keep MAP>65 - Lactic acid levels normalizing - follow up blood cultures - no growth after 2 days. - Antibiotics, as above. - Cortisol levels showed level inadequate for the degree of stress. Started stress dose steroids on 8/11 give for a total of 7-days.  - Agressive IV fluid resuscitation,IV albumin and transfusions as needed.  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 - On IV thiamine.   Cardiomegaly on CXR - ECHO shows diastolic dysfunction, mild decrease LVEF however most notable is RV dilatation with poor RVEF query hepatopulmonary syndrome with RV dysfunction - This issue adds complexity to her management  GI Continue Protonix infusion EGD per GI supposedly in the morning will need FFP transfusion prior to that  NUTRITIONAL STATUS DIET--> NPO (s/p 5 days since intubation) Cannot use GI tract due to potential NG disruption of esophageal varices if present. PICC line placed today, begin TPN  ENDO - will use  ICU hypoglycemic\Hyperglycemia protocol as needed -Stress dose steroids for relative adrenal insufficiency/persistent hypotension  ELECTROLYTES -Adjusted IV fluids - follow labs   - pharmacy consulting and following    LOS: 8 days   Additional comments: Multidisciplinary rounds were performed with ICU staff.  Initiated discussion of goals  of care with family (husband, daughter).  The patient is critically ill with multiorgan failure and poor long-term prognosis is very poor.  Have requested Palliative Care consultation to assist with management of end-of-life issues and goals of care discussion.  Critical Care Total Time*: 45 Minutes  C. Derrill Kay, MD [facility] 09/09/2018  *Care during the described time interval was provided by me and/or other providers on the critical care team.  I have reviewed this patient's available data, including medical history, events of note, physical examination and test results as part of my evaluation.

## 2018-09-09 NOTE — Consult Note (Signed)
Consultation Note Date: 09/09/2018   Patient Name: Mackenzie Little  DOB: [birth year]  MRN: [id number]  Age / Sex: 53 y.o., female  PCP: Ranae Plumber, [place] Referring Physician: Saundra Shelling, MD  Reason for Consultation: Establishing goals of care  HPI/Patient Profile: Mackenzie Little  is a 53 y.o. female with a known history of hypertension, alcohol abuse presented to the emergency room for generalized weakness.  Patient has been fatigued for the last 3 days and noticed dark tarry stool for the last couple of days.  Clinical Assessment and Goals of Care: Patient is resting in bed on ventilator. Husband and daughter at bedside. They state she is a wonderful wife and mother, taking care of everyone. They state prior to this hospitalization, she was able to do things as she always has, but "was a little more cranky lately". Her husband states she has always drank small amounts of alcohol, but since her most recent admission, she has been drinking much more and taking Ibuprofen. He states she has had pain, and has been treating it with alcohol.   We discussed her diagnoses, prognosis, GOC, EOL wishes disposition and options.  A detailed discussion was had today regarding advanced directives.  Concepts specific to code status, artifical feeding and hydration,  and rehospitalization were discussed.  The difference between an aggressive medical intervention path and a comfort care path was discussed.  Values and goals of care important to patient and family were attempted to be elicited.  Discussed limitations of medical interventions to prolong quality of life in some situations and discussed the concept of human mortality. Natural trajectory was discussed.  They state she always hated going to doctors and would wait as long as possible, and hates taking medications.  Husband states he feels he did not push her hard  enough to take care of herself. Discussed if she would want the current care given her feelings on healthcare.  He said likely she would not, but is unsure about how to proceed with care because he feels that perhaps if she had known how sick she was, she would have done things differently. He is hopeful that she will improve and "it may be enough for her to change." He understands her medical frailty, and states he is aware she may not live through the hospitalization. He states at this time, he wants all care possible. He states he does not believe she would want a tracheostomy, and understands the time frame for oral intubation. He is considering no CPR if her heart stops. He states he and his daughter will speak with the other family members regarding care moving forward.        SUMMARY OF RECOMMENDATIONS   Full code/full scope.   Prognosis:   Poor  Discharge Planning: To Be Determined      Primary Diagnoses: Present on Admission: . GI bleed   I have reviewed the medical record, interviewed the patient and family, and examined the patient. The following aspects are pertinent.  Past Medical History:  Diagnosis Date  . Hypertension    Social History   Socioeconomic History  . Marital status: Married    Spouse name: Not on file  . Number of children: Not on file  . Years of education: Not on file  . Highest education level: Not on file  Occupational History  . Not on file  Social Needs  . Financial resource strain: Not on file  . Food insecurity    Worry: Not on file    Inability: Not on file  . Transportation needs    Medical: Not on file    Non-medical: Not on file  Tobacco Use  . Smoking status: Former Smoker  . Smokeless tobacco: Never Used  Substance and Sexual Activity  . Alcohol use: Yes  . Drug use: No  . Sexual activity: Yes  Lifestyle  . Physical activity    Days per week: Not on file    Minutes per session: Not on file  . Stress: Not on file   Relationships  . Social connections    Talks on phone: Not on file    Gets together: Not on file    Attends religious service: Not on file    Active member of club or organization: Not on file    Attends meetings of clubs or organizations: Not on file    Relationship status: Not on file  Other Topics Concern  . Not on file  Social History Narrative  . Not on file   History reviewed. No pertinent family history. Scheduled Meds: . chlorhexidine gluconate (MEDLINE KIT)  15 mL Mouth Rinse BID  . Chlorhexidine Gluconate Cloth  6 each Topical QHS  . folic acid  1 mg Intravenous Daily  . hydrocortisone sod succinate (SOLU-CORTEF) inj  50 mg Intravenous Q6H  . mouth rinse  15 mL Mouth Rinse 10 times per day  . pantoprazole  40 mg Intravenous Q12H  . sodium chloride flush  10-40 mL Intracatheter Q12H  . sodium chloride flush  10-40 mL Intracatheter Q12H   Continuous Infusions: . sodium chloride 250 mL (09/04/18 0904)  . ampicillin-sulbactam (UNASYN) IV    . dexmedetomidine (PRECEDEX) IV infusion Stopped (09/04/18 1111)  . dextrose 5% lactated ringers 100 mL/hr at 09/09/18 1348  . fentaNYL infusion INTRAVENOUS 50 mcg/hr (09/09/18 1348)  . norepinephrine (LEVOPHED) Adult infusion 10 mcg/min (09/09/18 1348)  . thiamine injection Stopped (09/09/18 0905)  . vasopressin (PITRESSIN) infusion - *FOR SHOCK* Stopped (09/07/18 0908)   PRN Meds:.sodium chloride, acetaminophen **OR** acetaminophen, LORazepam, ondansetron **OR** ondansetron (ZOFRAN) IV, sodium chloride flush, sodium chloride flush, traMADol Medications Prior to Admission:  Prior to Admission medications   Medication Sig Start Date End Date Taking? Authorizing Provider  acetaminophen (TYLENOL) 325 MG tablet Take 650 mg by mouth every 6 (six) hours as needed.   Yes [provider]   No Known Allergies Review of Systems  Unable to perform ROS   Physical Exam Pulmonary:     Comments: On vent Skin:    General: Skin is  warm and dry.     Vital Signs: BP (!) 117/56   Pulse 79   Temp 98 F (36.7 C) (Oral)   Resp 19   Ht 5' 6" (1.676 m)   Wt 75.4 kg   SpO2 98%   BMI 26.83 kg/m  Pain Scale: CPOT   Pain Score: 0-No pain   SpO2: SpO2: 98 % O2 Device:SpO2: 98 % O2 Flow Rate: .O2 Flow Rate (L/min): (S) 15 L/min  IO:   Intake/output summary:   Intake/Output Summary (Last 24 hours) at 09/09/2018 1613 Last data filed at 09/09/2018 1348 Gross per 24 hour  Intake 3332.82 ml  Output 50 ml  Net 3282.82 ml    LBM: Last BM Date: 09/07/18 Baseline Weight: Weight: 77.1 kg Most recent weight: Weight: 75.4 kg     Palliative Assessment/Data: On vent     Time In: 3:20 Time Out: 4:30 Time Total: 70 min Greater than 50%  of this time was spent counseling and coordinating care related to the above assessment and plan.  Signed by: Crystal Griffin, NP   Please contact Palliative Medicine Team phone at [phone] for questions and concerns.  For individual provider: See Amion             

## 2018-09-09 NOTE — Progress Notes (Signed)
Patient Husband and daughter in law updated via phone.

## 2018-09-09 NOTE — Progress Notes (Signed)
Pharmacy Antibiotic Note  Mackenzie Little is a 53 y.o. female admitted on 09/10/2018. Patient initially started on ceftriaxone, cefepime, metronidazole, and vancomycin for sepsis. Patient intubated, requiring pressors, and on multiple infusions secondary to GI bleed. During AM rounds, patient noted to have possible aspiration pneumonia and patient to be transitioned to ampicillin-sulbactam. Pharmacy has been consulted for ampicillin-sulbactam dosing.  Plan: Continue ampicillin-sulbactam 3g IV Q12H   Height: 5\' 6"  (167.6 cm) Weight: 166 lb 3.6 oz (75.4 kg) IBW/kg (Calculated) : 59.3  Temp (24hrs), Avg:97.8 F (36.6 C), Min:97.4 F (36.3 C), Max:98.6 F (37 C)  Recent Labs  Lab 09/05/18 0534 09/05/18 1337 09/06/18 0356 09/06/18 1142 09/07/18 0425 09/07/18 1131 09/08/18 0405 09/09/18 0322  WBC 17.4*  --   --  26.3* 28.9*  --  35.2* 27.6*  CREATININE 1.16* UNABLE TO REPORT DUE TO ICTERIC INTERFERENCE SDR 1.23*  --  1.20*  --  1.92* 2.72*  LATICACIDVEN  --   --   --   --   --  2.1* 1.8  --     Estimated Creatinine Clearance: 25.4 mL/min (A) (by C-G formula based on SCr of 2.72 mg/dL (H)).    No Known Allergies  Antimicrobials this admission: Ceftriaxone 08/05 x 1 Cefepime 08/05 >> 08/11 Vancomycin 08/05 x 1  Metronidazole 08/06 >> 08/11 Unasyn 08/11 >>  Dose adjustments this admission: 08/13 Unasyn changed from Q6H to Q12H  Microbiology results: 08/05 BCx: no growth  08/09 BCx: no growth x 4 days 08/09 Respiratory Cx: rare candida albicans; few staph aureus (sensitive to oxacillin) 08/05 MRSA PCR: negative  Thank you for allowing pharmacy to be a part of this patient's care.  Sara Selvidge [name] 09/09/2018 2:43 PM

## 2018-09-09 NOTE — Progress Notes (Signed)
Pharmacy Electrolyte Monitoring Consult:  Pharmacy consulted to assist in monitoring and replacing electrolytes in this 53 y.o. female admitted on 08/31/2018 with Constipation and Chest Pain   Labs:  Sodium (mmol/L)  Date Value  09/09/2018 137   Potassium (mmol/L)  Date Value  09/09/2018 3.9   Magnesium (mg/dL)  Date Value  09/09/2018 2.1   Phosphorus (mg/dL)  Date Value  09/09/2018 5.1 (H)   Calcium (mg/dL)  Date Value  09/09/2018 7.0 (L)   Albumin (g/dL)  Date Value  09/08/2018 2.1 (L)   Corrected Calcium: 8.8  Assessment/Plan: Patient requiring mechanical intubation. Patient with past medical history including alcohol abuse. Patient ordered D5/NS at 165mL/hr to maintain glucose.   No replacement warranted. If replacement warranted in am, will add potassium to MIVF after consulting MD.   Will replace to maintain potassium ~ 4, magnesium ~ 2, and goal phosphorus ~ 2.5-3.   Pharmacy will continue to monitor and adjust per consult.   Mackenzie Little L 09/09/2018 4:07 PM

## 2018-09-09 NOTE — Progress Notes (Signed)
   09/09/18 1500  Clinical Encounter Type  Visited With Family  Visit Type Initial  Referral From Physician  Consult/Referral To [facility] was able to speak with husband Elta Guadeloupe) briefly in the hall way. Chaplain explain that she would like to spend some time with he and daughter after a webex.. However, Elta Guadeloupe was not available, not sure if he had left hospital. Chaplain will follow up tomorrow.Marland Kitchen

## 2018-09-09 NOTE — Progress Notes (Addendum)
GI Inpatient Follow-up Note  Subjective:  Patient seen and examined this morning in hospital bed. No acute overnight events. Per nursing, no BMs overnight and the last tarry BM was on 08/11. Hemoglobin this morning has come down to 7.5. INR 2.2 this morning. She remains intubated and sedated on mechanical ventilation.  Scheduled Inpatient Medications:  . chlorhexidine gluconate (MEDLINE KIT)  15 mL Mouth Rinse BID  . Chlorhexidine Gluconate Cloth  6 each Topical QHS  . folic acid  1 mg Intravenous Daily  . hydrocortisone sod succinate (SOLU-CORTEF) inj  50 mg Intravenous Q6H  . mouth rinse  15 mL Mouth Rinse 10 times per day  . pantoprazole  40 mg Intravenous Q12H  . sodium chloride flush  10-40 mL Intracatheter Q12H    Continuous Inpatient Infusions:   . sodium chloride 250 mL (09/04/18 0904)  . ampicillin-sulbactam (UNASYN) IV    . dexmedetomidine (PRECEDEX) IV infusion Stopped (09/04/18 1111)  . dextrose 5% lactated ringers 100 mL/hr at 09/09/18 0444  . fentaNYL infusion INTRAVENOUS 250 mcg/hr (09/09/18 0013)  . norepinephrine (LEVOPHED) Adult infusion 12 mcg/min (09/09/18 0449)  . phytonadione (VITAMIN K) IV Stopped (09/08/18 1130)  . thiamine injection Stopped (09/08/18 0902)  . vasopressin (PITRESSIN) infusion - *FOR SHOCK* Stopped (09/07/18 0908)    PRN Inpatient Medications:  sodium chloride, acetaminophen **OR** acetaminophen, LORazepam, ondansetron **OR** ondansetron (ZOFRAN) IV, sodium chloride flush, traMADol  Review of Systems: Constitutional: Weight is stable.  Eyes: No changes in vision. ENT: No oral lesions, sore throat.  GI: see HPI.  Heme/Lymph: No easy bruising.  CV: No chest pain.  GU: No hematuria.  Integumentary: No rashes.  Neuro: No headaches.  Psych: No depression/anxiety.  Endocrine: No heat/cold intolerance.  Allergic/Immunologic: No urticaria.  Resp: No cough, SOB.  Musculoskeletal: No joint swelling.    Physical Examination: BP (!) 106/53    Pulse 69   Temp (!) 97.5 F (36.4 C) (Axillary)   Resp 16   Ht '5\' 6"'  (1.676 m)   Wt 75.4 kg   SpO2 98%   BMI 26.83 kg/m  Critically ill appearing, intubated and sedated. On ventilator.  Gen: NAD, alert and oriented x 4 HEENT: PEERLA, EOMI, scleral icterus Neck: supple, no JVD or thyromegaly Chest: CTA bilaterally, no wheezes, crackles, or other adventitious sounds CV: RRR, no m/g/c/r Abd: soft, NT, mildly distended, hypoactive BS in all four quadrants; no HSM, guarding, ridigity, or rebound tenderness Ext: no edema, well perfused with 2+ pulses, Skin: no rash or lesions noted Lymph: no LAD  Data: Lab Results  Component Value Date   WBC 27.6 (H) 09/09/2018   HGB 7.5 (L) 09/09/2018   HCT 23.8 (L) 09/09/2018   MCV 85.3 09/09/2018   PLT 96 (L) 09/09/2018   Recent Labs  Lab 09/08/18 1227 09/08/18 1952 09/09/18 0322  HGB 8.0* 7.9* 7.5*   Lab Results  Component Value Date   NA 137 09/09/2018   K 3.9 09/09/2018   CL 116 (H) 09/09/2018   CO2 13 (L) 09/09/2018   BUN 51 (H) 09/09/2018   CREATININE 2.72 (H) 09/09/2018   Lab Results  Component Value Date   ALT 19 09/08/2018   AST 43 (H) 09/08/2018   ALKPHOS 67 09/08/2018   BILITOT 15.7 (H) 09/08/2018   Recent Labs  Lab 09/09/18 0322  INR 2.2*   Assessment/Plan:  53 y/o AA female admitted for generalized weakness, melena x 2 weeks  1. Severe acute hypoxic and hypercapnic respiratory failure 2. Multilobar pneumonia -  suspected 2/2 aspiration, on IVZosyn. 3. Alcoholic hepatitis  4. Decompensated alcoholic cirrhosis - labs today show total bilirubin 14.4. Likely 2/2 cholestasis from acute illness and infection  - Hemoglobin this morning down to 7.5. Continue to monitor H&H q8h. Transfuse for hemoglobin <7.0.  - Continue IV Protonix  - Octreotide was discontinued yesterday  - Continue Pentoxifylline for at least 28 days for treatment of alcoholic hepatitis  - INR this morning 2.2. INR would ideally want to be  1.5, but needs to at least be <1.7 before procedures are performed. Will need to have at least 3-4 units of FFP Friday morning. Recheck INR tomorrow morning. - Since there has been no recurrent melena since 08/11, we will plan for EGD on tomorrow with Dr. Alice Reichert - Patient's prognosis remains poor given her severe hepatic dysfunction and multi-organ failure (respiratory, kidney, coagulopathic) - Please call for any signs of active GI bleeding - Continue care per intensivist - We will continue to follow along   Please call with questions or concerns.    Octavia Bruckner, PA-C [facility] [phone] (Cell)

## 2018-09-09 NOTE — Progress Notes (Signed)
Peripherally Inserted Central Catheter/Midline Placement  The IV Nurse has discussed with the patient and/or persons authorized to consent for the patient, the purpose of this procedure and the potential benefits and risks involved with this procedure.  The benefits include less needle sticks, lab draws from the catheter, and the patient may be discharged home with the catheter. Risks include, but not limited to, infection, bleeding, blood clot (thrombus formation), and puncture of an artery; nerve damage and irregular heartbeat and possibility to perform a PICC exchange if needed/ordered by physician.  Alternatives to this procedure were also discussed.  Bard Power PICC patient education guide, fact sheet on infection prevention and patient information card has been provided to patient /or left at bedside.    PICC/Midline Placement Documentation  PICC Triple Lumen 09/09/18 PICC Right Brachial 36 cm 1 cm (Active)  Indication for Insertion or Continuance of Line Administration of hyperosmolar/irritating solutions (i.e. TPN, Vancomycin, etc.) 09/09/18 1250  Exposed Catheter (cm) 1 cm 09/09/18 1250  Site Assessment Clean;Dry;Intact 09/09/18 1250  Lumen #1 Status Flushed;Saline locked;Blood return noted 09/09/18 1250  Lumen #2 Status Flushed;Saline locked;Blood return noted 09/09/18 1250  Lumen #3 Status Flushed;Saline locked;Blood return noted 09/09/18 1250  Dressing Type Transparent;Securing device 09/09/18 1250  Dressing Status Clean;Dry;Intact;Antimicrobial disc in place 09/09/18 1250  Dressing Intervention New dressing 09/09/18 1250  Dressing Change Due 09/16/18 09/09/18 1250       Enos Fling 09/09/2018, 1:00 PM

## 2018-09-09 NOTE — Progress Notes (Signed)
PHARMACY NOTE:  ANTIMICROBIAL RENAL DOSAGE ADJUSTMENT  Current antimicrobial regimen includes a mismatch between antimicrobial dosage and estimated renal function.  As per policy approved by the Pharmacy & Therapeutics and Medical Executive Committees, the antimicrobial dosage will be adjusted accordingly.  Current antimicrobial dosage:  Unasyn 3g IV q6h  Indication: Aspiration pneumonia  Renal Function:  Estimated Creatinine Clearance: 25.4 mL/min (A) (by C-G formula based on SCr of 2.72 mg/dL (H)). []      On intermittent HD, scheduled: []      On CRRT    Antimicrobial dosage has been changed to:  Unasyn 3g IV q12h per CrCl 15 - 29 ml/min  Thank you for allowing pharmacy to be a part of this patient's care.  Tobie Lords, PharmD, BCPS Clinical Pharmacist 09/09/2018 6:01 AM

## 2018-09-10 ENCOUNTER — Inpatient Hospital Stay: Payer: Medicaid Other

## 2018-09-10 ENCOUNTER — Encounter: Payer: Self-pay | Admitting: Certified Registered"

## 2018-09-10 ENCOUNTER — Encounter: Admission: EM | Disposition: E | Payer: Self-pay | Source: Home / Self Care | Attending: Internal Medicine

## 2018-09-10 DIAGNOSIS — G9340 Encephalopathy, unspecified: Secondary | ICD-10-CM

## 2018-09-10 DIAGNOSIS — D62 Acute posthemorrhagic anemia: Secondary | ICD-10-CM

## 2018-09-10 DIAGNOSIS — Z9911 Dependence on respirator [ventilator] status: Secondary | ICD-10-CM

## 2018-09-10 DIAGNOSIS — J690 Pneumonitis due to inhalation of food and vomit: Secondary | ICD-10-CM

## 2018-09-10 HISTORY — PX: ESOPHAGOGASTRODUODENOSCOPY: SHX5428

## 2018-09-10 LAB — CULTURE, BLOOD (ROUTINE X 2)
Culture: NO GROWTH
Culture: NO GROWTH
Special Requests: ADEQUATE
Special Requests: ADEQUATE

## 2018-09-10 LAB — CBC WITH DIFFERENTIAL/PLATELET
Abs Immature Granulocytes: 0.58 10*3/uL — ABNORMAL HIGH (ref 0.00–0.07)
Basophils Absolute: 0.1 10*3/uL (ref 0.0–0.1)
Basophils Relative: 0 %
Eosinophils Absolute: 0 10*3/uL (ref 0.0–0.5)
Eosinophils Relative: 0 %
HCT: 22 % — ABNORMAL LOW (ref 36.0–46.0)
Hemoglobin: 7 g/dL — ABNORMAL LOW (ref 12.0–15.0)
Immature Granulocytes: 3 %
Lymphocytes Relative: 9 %
Lymphs Abs: 1.6 10*3/uL (ref 0.7–4.0)
MCH: 26.6 pg (ref 26.0–34.0)
MCHC: 31.8 g/dL (ref 30.0–36.0)
MCV: 83.7 fL (ref 80.0–100.0)
Monocytes Absolute: 0.4 10*3/uL (ref 0.1–1.0)
Monocytes Relative: 2 %
Neutro Abs: 14.3 10*3/uL — ABNORMAL HIGH (ref 1.7–7.7)
Neutrophils Relative %: 86 %
Platelets: 71 10*3/uL — ABNORMAL LOW (ref 150–400)
RBC: 2.63 MIL/uL — ABNORMAL LOW (ref 3.87–5.11)
RDW: 18.9 % — ABNORMAL HIGH (ref 11.5–15.5)
WBC: 16.9 10*3/uL — ABNORMAL HIGH (ref 4.0–10.5)
nRBC: 1.2 % — ABNORMAL HIGH (ref 0.0–0.2)

## 2018-09-10 LAB — PREPARE RBC (CROSSMATCH)

## 2018-09-10 LAB — BPAM FFP
Blood Product Expiration Date: 202008182359
ISSUE DATE / TIME: 202008130513
Unit Type and Rh: 5100

## 2018-09-10 LAB — COMPREHENSIVE METABOLIC PANEL
ALT: 27 U/L (ref 0–44)
AST: 65 U/L — ABNORMAL HIGH (ref 15–41)
Albumin: 2.1 g/dL — ABNORMAL LOW (ref 3.5–5.0)
Alkaline Phosphatase: 51 U/L (ref 38–126)
Anion gap: 10 (ref 5–15)
BUN: 55 mg/dL — ABNORMAL HIGH (ref 6–20)
CO2: 12 mmol/L — ABNORMAL LOW (ref 22–32)
Calcium: 7.6 mg/dL — ABNORMAL LOW (ref 8.9–10.3)
Chloride: 117 mmol/L — ABNORMAL HIGH (ref 98–111)
Creatinine, Ser: 3.02 mg/dL — ABNORMAL HIGH (ref 0.44–1.00)
GFR calc Af Amer: 20 mL/min — ABNORMAL LOW (ref >60)
GFR calc non Af Amer: 17 mL/min — ABNORMAL LOW (ref >60)
Glucose, Bld: 113 mg/dL — ABNORMAL HIGH (ref 70–99)
Potassium: 3.7 mmol/L (ref 3.5–5.1)
Sodium: 139 mmol/L (ref 135–145)
Total Bilirubin: 16.7 mg/dL — ABNORMAL HIGH (ref 0.3–1.2)
Total Protein: 6.6 g/dL (ref 6.5–8.1)

## 2018-09-10 LAB — PHOSPHORUS: Phosphorus: 5.2 mg/dL — ABNORMAL HIGH (ref 2.5–4.6)

## 2018-09-10 LAB — HEMOGLOBIN AND HEMATOCRIT, BLOOD
HCT: 22.9 % — ABNORMAL LOW (ref 36.0–46.0)
HCT: 24.6 % — ABNORMAL LOW (ref 36.0–46.0)
Hemoglobin: 7.5 g/dL — ABNORMAL LOW (ref 12.0–15.0)
Hemoglobin: 8.1 g/dL — ABNORMAL LOW (ref 12.0–15.0)

## 2018-09-10 LAB — PREPARE FRESH FROZEN PLASMA: Unit division: 0

## 2018-09-10 LAB — PROTIME-INR
INR: 1.9 — ABNORMAL HIGH (ref 0.8–1.2)
Prothrombin Time: 21.2 seconds — ABNORMAL HIGH (ref 11.4–15.2)

## 2018-09-10 LAB — GLUCOSE, CAPILLARY
Glucose-Capillary: 103 mg/dL — ABNORMAL HIGH (ref 70–99)
Glucose-Capillary: 105 mg/dL — ABNORMAL HIGH (ref 70–99)
Glucose-Capillary: 109 mg/dL — ABNORMAL HIGH (ref 70–99)
Glucose-Capillary: 109 mg/dL — ABNORMAL HIGH (ref 70–99)
Glucose-Capillary: 113 mg/dL — ABNORMAL HIGH (ref 70–99)
Glucose-Capillary: 96 mg/dL (ref 70–99)

## 2018-09-10 LAB — MAGNESIUM: Magnesium: 1.9 mg/dL (ref 1.7–2.4)

## 2018-09-10 SURGERY — EGD (ESOPHAGOGASTRODUODENOSCOPY)
Anesthesia: General

## 2018-09-10 MED ORDER — SODIUM CHLORIDE 0.9% IV SOLUTION: Freq: Once | INTRAVENOUS | Status: AC

## 2018-09-10 MED ORDER — LACTULOSE 10 GM/15ML PO SOLN
30.0000 g | Freq: Three times a day (TID) | ORAL | Status: DC
  Administered 2018-09-10 – 2018-09-15 (×12): 30 g
  Filled 2018-09-10 (×12): qty 60

## 2018-09-10 MED ORDER — VITAL HIGH PROTEIN PO LIQD
1000.0000 mL | ORAL | Status: DC
  Administered 2018-09-10: 20:00:00 1000 mL

## 2018-09-10 MED ORDER — MIDAZOLAM HCL 2 MG/2ML IJ SOLN: 2.0000 mg | INTRAMUSCULAR | Status: DC | PRN

## 2018-09-10 MED ORDER — FENTANYL CITRATE (PF) 100 MCG/2ML IJ SOLN: 50.0000 ug | INTRAMUSCULAR | Status: DC | PRN

## 2018-09-10 MED ORDER — MIDAZOLAM HCL 2 MG/2ML IJ SOLN
INTRAMUSCULAR | Status: AC
  Filled 2018-09-10: qty 2

## 2018-09-10 MED ORDER — FENTANYL CITRATE (PF) 100 MCG/2ML IJ SOLN
100.0000 ug | Freq: Once | INTRAMUSCULAR | Status: AC
  Administered 2018-09-10: 15:00:00 100 ug via INTRAVENOUS

## 2018-09-10 MED ORDER — RIFAXIMIN 550 MG PO TABS
550.0000 mg | ORAL_TABLET | Freq: Two times a day (BID) | ORAL | Status: DC
  Administered 2018-09-10 – 2018-09-18 (×15): 550 mg
  Filled 2018-09-10 (×15): qty 1

## 2018-09-10 MED ORDER — ROCURONIUM BROMIDE 50 MG/5ML IV SOLN
50.0000 mg | Freq: Once | INTRAVENOUS | Status: AC
  Administered 2018-09-10: 15:00:00 50 mg via INTRAVENOUS

## 2018-09-10 MED ORDER — STERILE WATER FOR INJECTION IJ SOLN
INTRAMUSCULAR | Status: AC
  Filled 2018-09-10: qty 10

## 2018-09-10 MED ORDER — HYDROCORTISONE NA SUCCINATE PF 100 MG IJ SOLR
50.0000 mg | Freq: Two times a day (BID) | INTRAMUSCULAR | Status: DC
  Administered 2018-09-10 – 2018-09-11 (×2): 50 mg via INTRAVENOUS
  Filled 2018-09-10 (×2): qty 2

## 2018-09-10 MED ORDER — MIDAZOLAM HCL 2 MG/2ML IJ SOLN
2.0000 mg | INTRAMUSCULAR | Status: DC | PRN
  Administered 2018-09-10 – 2018-09-11 (×5): 2 mg via INTRAVENOUS
  Filled 2018-09-10 (×6): qty 2

## 2018-09-10 MED ORDER — FENTANYL CITRATE (PF) 100 MCG/2ML IJ SOLN
INTRAMUSCULAR | Status: AC
  Administered 2018-09-10: 15:00:00 100 ug via INTRAVENOUS
  Filled 2018-09-10: qty 2

## 2018-09-10 MED ORDER — SODIUM BICARBONATE 8.4 % IV SOLN
INTRAVENOUS | Status: DC
  Administered 2018-09-10 – 2018-09-11 (×3): via INTRAVENOUS
  Filled 2018-09-10 (×5): qty 100

## 2018-09-10 MED ORDER — FENTANYL CITRATE (PF) 100 MCG/2ML IJ SOLN
50.0000 ug | INTRAMUSCULAR | Status: DC | PRN
  Administered 2018-09-10: 100 ug via INTRAVENOUS
  Filled 2018-09-10: qty 2

## 2018-09-10 MED ORDER — PRO-STAT SUGAR FREE PO LIQD
30.0000 mL | Freq: Two times a day (BID) | ORAL | Status: DC
  Administered 2018-09-10 – 2018-09-11 (×2): 30 mL

## 2018-09-10 MED ORDER — EPINEPHRINE 1 MG/10ML IJ SOSY
PREFILLED_SYRINGE | INTRAMUSCULAR | Status: AC
  Filled 2018-09-10: qty 20

## 2018-09-10 MED ORDER — SODIUM CHLORIDE 0.9% IV SOLUTION
Freq: Once | INTRAVENOUS | Status: AC
  Administered 2018-09-10: 06:00:00 via INTRAVENOUS

## 2018-09-10 MED ORDER — MIDAZOLAM HCL 2 MG/2ML IJ SOLN
2.0000 mg | Freq: Once | INTRAMUSCULAR | Status: AC
  Administered 2018-09-10: 03:00:00 2 mg via INTRAVENOUS

## 2018-09-10 MED ORDER — SODIUM CHLORIDE 0.9% IV SOLUTION
Freq: Once | INTRAVENOUS | Status: AC
  Administered 2018-09-10: 04:00:00 via INTRAVENOUS

## 2018-09-10 MED ORDER — ROCURONIUM BROMIDE 50 MG/5ML IV SOLN
INTRAVENOUS | Status: AC
  Administered 2018-09-10: 15:00:00 50 mg via INTRAVENOUS
  Filled 2018-09-10: qty 1

## 2018-09-10 NOTE — Progress Notes (Signed)
PULMONARY/CCM PROGRESS NOTE  PT PROFILE: 53 y.o. adm 08/05 with hypovolemic shock due to melena, weakness with a history of alcoholic cirrhosis.  Other problems on admission include AKI, lactic acidosis, elevated troponin.  MAJOR EVENTS/TEST RESULTS: 08/05 gastroenterology consultation for GI bleeding 08/05 cardiology consultation for elevated troponin 08/06 abdominal ultrasound: No ascites 08/08 pelvic ultrasound: Normal 08/08 intubated for respiratory distress, suspected aspiration 08/11 echocardiogram: LVEF 50-55%.  LV cavity mildly dilated.  Moderate LVH.  Doppler parameters consistent with diastolic dysfunction.  LA moderately dilated. 08/13 palliative care consultation 08/14 EGD: Distal esophagitis without active bleeding.  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  No endoscopic evidence of varices in the entire examined stomach.  INDWELLING DEVICES: R femoral CVL 08/05 >>  ETT 08/08 >> 08/13 RUE PICC 08/13 >>   MICRO DATA: SARS-CoV-2 PCR 8/5 >>negative MRSA PCR 8/5>> negative Resp 8/9 >> MSSA Blood 8/5 >> negative Blood 8/9 >>negative  ANTIMICROBIALS:  Vancomycin 8/5 >> 8/6 Cefepime 8/5 >> 8/11 Metronidazole 8/6 >> 8/11 Ampicillin-sulbactam 8/11 >>   SUBJ: RASS -4 off of all sedation. Synchronous with vent   OBJ: Vitals:   09/18/2018 1300 09/09/2018 1400 09/26/2018 1500 09/25/2018 1600  BP: 111/62 (!) 102/58 111/66   Pulse: 71 72 73   Resp: _0 Temp:      TempSrc:      SpO2: 98% 96% 97% 95%  Weight:      Height:       Vent Mode: PRVC FiO2 (%):  [28 %] 28 % Set Rate:  [16 bmp] 16 bmp Vt Set:  [500 mL] 500 mL PEEP:  [5 cmH20] 5 cmH20 Plateau Pressure:  [16 cmH20-17 cmH20] 16 cmH20   Gen: Minimally responsive, intubated, synchronous with ventilator HEENT: Sclericterus, NCAT Neck: No LAN, no JVD noted Lungs: Clear anteriorly Cardiovascular: Regular, no M Abdomen: Mildly distended soft, NT, +BS Ext: + Symmetric UEs edema,  extremities warm Neuro: PERRL, EOMI, motor/sensory grossly intact Skin: No lesions noted   BMP Latest Ref Rng & Units 09/14/2018 09/09/2018 09/08/2018  Glucose 70 - 99 mg/dL 113(H) 126(H) 150(H)  BUN 6 - 20 mg/dL 55(H) 51(H) 41(H)  Creatinine 0.44 - 1.00 mg/dL 3.02(H) 2.72(H) 1.92(H)  Sodium 135 - 145 mmol/L 139 137 136  Potassium 3.5 - 5.1 mmol/L 3.7 3.9 4.0  Chloride 98 - 111 mmol/L 117(H) 116(H) 116(H)  CO2 22 - 32 mmol/L 12(L) 13(L) 14(L)  Calcium 8.9 - 10.3 mg/dL 7.6(L) 7.0(L) 6.7(L)    Hepatic Function Latest Ref Rng & Units 09/23/2018 09/08/2018 09/07/2018  Total Protein 6.5 - 8.1 g/dL 6.6 6.9 6.4(L)  Albumin 3.5 - 5.0 g/dL 2.1(L) 2.1(L) 1.7(L)  AST 15 - 41 U/L 65(H) 43(H) 36  ALT 0 - 44 U/L _1 Alk Phosphatase 38 - 126 U/L 51 67 63  Total Bilirubin 0.3 - 1.2 mg/dL 16.7(H) 15.7(H) 13.5(H)  Bilirubin, Direct 0.0 - 0.2 mg/dL - - 8.2(H)    CBC Latest Ref Rng & Units 09/09/2018 09/14/2018 09/09/2018  WBC 4.0 - 10.5 K/uL - 16.9(H) -  Hemoglobin 12.0 - 15.0 g/dL 7.5(L) 7.0(L) 7.4(L)  Hematocrit 36.0 - 46.0 % 22.9(L) 22.0(L) 23.1(L)  Platelets 150 - 400 K/uL - 71(L) -    ABG    Component Value Date/Time   PHART 7.24 (L) 09/04/2018 1150   PCO2ART 44 09/04/2018 1150   PO2ART 97 09/04/2018 1150   HCO3 18.9 (L) 09/04/2018 1150   ACIDBASEDEF 8.3 (H) 09/04/2018 1150   O2SAT  96.2 09/04/2018 1150    CXR: No new film   IMPRESSION: Ventilator dependent respiratory failure Aspiration pneumonia Alcoholic liver disease UGI bleed.  Appears to be resolved Hemorrhagic shock, resolved Oliguric AKI Metabolic acidosis Acute blood loss anemia Thrombocytopenia Coagulopathy due to liver failure Acute encephalopathy.  Likely hepatic in origin  PLAN/REC: Cont full vent support - settings reviewed and/or adjusted Cont vent bundle Daily SBT if/when meets criteria ICU hemodynamic monitoring.  MAP goal >65 mmHg Decrease hydrocortisone 8/14 Monitor BMET intermittently Monitor  I/Os Correct electrolytes as indicated Bicarbonate infusion initiated 8/14 DVT px: SCDs Monitor CBC intermittently Transfuse per usual guidelines Monitor coags intermittently Monitor temp, WBC count Micro and abx as above Place OG tube and initiate TF protocol Continue pantoprazole Check ammonia level in a.m. Monitor LFTs intermittently   CCM time: 45 mins The above time includes time spent in consultation with patient and/or family members and reviewing care plan on multidisciplinary rounds  Merton Border, MD PCCM service Mobile [phone] Pager [phone] 09/20/2018 5:40 PM

## 2018-09-10 NOTE — Op Note (Addendum)
[facility] Gastroenterology Patient Name: Mackenzie Little Procedure Date: 09/18/2018 2:36 PM MRN: [id number] Account #: [id number] Date of Birth: [birth year] Admit Type: Inpatient Age: 53 Room: [facility] ENDO ROOM 3 Gender: Female Note Status: Finalized Procedure:            Upper GI endoscopy Indications:          Acute post hemorrhagic anemia, Melena Patient Profile:      53 y/o encephalopathic female presents with recent GI                        bleed. Patient is on mechanical ventilatory support and                        is not responsive to verbal or tactile stimuli. Providers:            Benay Pike. Alice Reichert MD, MD Referring MD:         Ranae Plumber Medicines:            Rocuronium, Fentanyl and Versed per RN staff ordered by                        Intensivist on duty. Complications:        No immediate complications. Procedure:            Pre-Anesthesia Assessment:                       - The risks and benefits of the procedure and the                        sedation options and risks were discussed with the                        patient. All questions were answered and informed                        consent was obtained.                       - Patient identification and proposed procedure were                        verified prior to the procedure by the nurse. The                        procedure was verified in the procedure room.                       - ASA Grade Assessment: IV - A patient with severe                        systemic disease that is a constant threat to life.                       - After reviewing the risks and benefits, the patient                        was deemed in satisfactory condition to undergo the  procedure.                       After obtaining informed consent, the endoscope was                        passed under direct vision. Throughout the procedure,                        the patient's blood  pressure, pulse, and oxygen                        saturations were monitored continuously. The Endoscope                        was introduced through the mouth, and advanced to the                        third part of duodenum. The upper GI endoscopy was                        accomplished without difficulty. The patient tolerated                        the procedure well. Findings:      LA Grade C (one or more mucosal breaks continuous between tops of 2 or       more mucosal folds, less than 75% circumference) esophagitis with no       bleeding was found in the distal esophagus.      Severe portal hypertensive gastropathy was found in the entire examined       stomach.      There is no endoscopic evidence of varices in the entire examined       stomach.      One non-bleeding cratered duodenal ulcer with no stigmata of bleeding       was found in the first portion of the duodenum. The lesion was 10 mm in       largest dimension. Impression:           - LA Grade C esophagitis.                       - Portal hypertensive gastropathy.                       - Non-bleeding duodenal ulcer with no stigmata of                        bleeding.                       - No specimens collected. Recommendation:       - Continue IV PPI, monitor H/H.                       Low statistical chance of rebleed from ulcer which is                        likely original source of acute hemorrhage.                       Call back GI if we can  help. Procedure Code(s):    --- Professional ---                       [phone], Esophagogastroduodenoscopy, flexible, transoral;                        diagnostic, including collection of specimen(s) by                        brushing or washing, when performed (separate procedure) Diagnosis Code(s):    --- Professional ---                       K92.1, Melena (includes Hematochezia)                       D62, Acute posthemorrhagic anemia                       K26.9,  Duodenal ulcer, unspecified as acute or chronic,                        without hemorrhage or perforation                       K31.89, Other diseases of stomach and duodenum                       K76.6, Portal hypertension                       K20.9, Esophagitis, unspecified CPT copyright 2019 American Medical Association. All rights reserved. The codes documented in this report are preliminary and upon coder review may  be revised to meet current compliance requirements. Efrain Sella MD, MD 09/07/2018 3:41:19 PM This report has been signed electronically. Number of Addenda: 0 Note Initiated On: 09/27/2018 2:36 PM Estimated Blood Loss: Estimated blood loss was minimal.      [facility]

## 2018-09-10 NOTE — Progress Notes (Signed)
Pharmacy Electrolyte Monitoring Consult:  Pharmacy consulted to assist in monitoring and replacing electrolytes in this 53 y.o. female admitted on 09/22/2018 with Constipation and Chest Pain   Labs:  Sodium (mmol/L)  Date Value  09/18/2018 139   Potassium (mmol/L)  Date Value  09/26/2018 3.7   Magnesium (mg/dL)  Date Value  09/24/2018 1.9   Phosphorus (mg/dL)  Date Value  09/01/2018 5.2 (H)   Calcium (mg/dL)  Date Value  09/26/2018 7.6 (L)   Albumin (g/dL)  Date Value  09/20/2018 2.1 (L)   Corrected Calcium: 9.12 mg/dL  Assessment/Plan: Patient requiring mechanical intubation. Patient with past medical history including alcohol abuse.   sodium bicarb drip at 127mL/hr currently infusing  Potassium borderline low: no replacement warranted per Dr Alva Garnet  Will replace to maintain potassium ~ 4, magnesium ~ 2, and goal phosphorus ~ 2.5-3  Dietary is recommending TPN initiation pending results of EGD which is scheduled to be completed this evening  Pharmacy will continue to monitor and adjust per consult.   Dallie Piles, PharmD 09/11/2018 7:38 AM

## 2018-09-10 NOTE — Interval H&P Note (Signed)
History and Physical Interval Note:  09/12/2018 12:42 PM  Mackenzie Little  has presented today for surgery, with the diagnosis of Melena, portal venous hypertension, decompensated alcoholic cirrhosis.  The various methods of treatment have been discussed with the patient and family. After consideration of risks, benefits and other options for treatment, the patient has consented to  Procedure(s): ESOPHAGOGASTRODUODENOSCOPY (EGD) (N/A) as a surgical intervention.  The patient's history has been reviewed, patient examined, no change in status, stable for surgery.  I have reviewed the patient's chart and labs.  Questions were answered to the patient's satisfaction.     [place], [place]

## 2018-09-10 NOTE — Progress Notes (Signed)
Patient family updated, Husband and daughter.

## 2018-09-10 NOTE — Progress Notes (Signed)
Nutrition Follow Up Note   DOCUMENTATION CODES:   Not applicable   INTERVENTION:   If TPN is initiated, recommend initiating Clinimix E 5/15 at 40 mL/hr x 24 hours + 20% ILE at 15 mL/hr x 12 hours. After 24 hours if electrolytes, CBGs, and triglycerides are within acceptable range can advance to goal regimen of Clinimix E 5/15 at 75 mL/hr x 24 hours + 20% ILE at 15 mL/hr x 12 hours. Provides 1638 kcal, 90 grams of protein, 1800 mL fluid from Clinimix, and 180 mL from lipids daily.  If TPN is initiated provide adult MVI daily in TPN, trace elements M/W/F in TPN due to shortage, thiamine 100 mg daily in TPN, and folic acid 1 mg daily in TPN.  Also recommend measuring daily weights  If tube feeds initiated, recommend:  Vital 1.2 @ 13ml/hr- Initiate at 17ml/hr and increase by 58ml/hr q 8 hours until goal rate is reached.   Free water flushes 34ml q4 hours to maintain tube patency   Regimen provides 1584kcal/day, 99g/day protein, 1256ml/day free water   NUTRITION DIAGNOSIS:   Inadequate oral intake related to inability to eat as evidenced by NPO status.  GOAL:   Provide needs based on ASPEN/SCCM guidelines  MONITOR:   Vent status, Labs, Weight trends, I & O's  ASSESSMENT:   53 year old female with PMHx of HTN, EtOH abuse, portal HTN, PUD admitted with hypovolemic/septic shock secondary to acute GI bleed, likely alcoholic hepatitis, with AKI, and evidence of DTs, required intubation on 8/8.   Pt remains intubated and ventilated. No OGT in place secondary to GI bleed. Plan is for EGD today. Pt now without adequate nutrition for 9 days. Pt will need to start some sort of nutrition support; pending EGD results pt may need TPN vs tube feeds. Pt is at refeed risk; recommend monitor K, Mg and P labs daily. No new weight since admit; will request daily weights.   Medications reviewed and include: folic acid, protonix, NaCl @20ml /hr, unasyn, levophed, Na bicarb, thiamine   Labs reviewed:  K 3.7 wnl, BUN 55(H), creat 3.02(H), Ca 7.6(L) adj. 9.12 wnl, alb 2.1(L), P 5.2(H), Mg 1.9 wnl Wbc- 16.9(H), Hgb 7.5(L), Hct 22.9(L)  Diet Order:   Diet Order    None     EDUCATION NEEDS:   No education needs have been identified at this time  Skin:  Skin Assessment: Reviewed RN Assessment  Last BM:  8/11  Height:   Ht Readings from Last 1 Encounters:  09/23/2018 5\' 6"  (1.676 m)   Weight:   Wt Readings from Last 1 Encounters:  08/30/2018 75.4 kg   Ideal Body Weight:  59.1 kg  BMI:  Body mass index is 26.83 kg/m.  Estimated Nutritional Needs:   Kcal:  1611 (PSU 2003b w/ MSJ 1389, Ve 8.4, Tmax 37.3)  Protein:  90-113 grams (1.2-1.5 grams/kg)  Fluid:  1.9 L/day  Koleen Distance MS, RD, LDN Pager #- [phone] Office#- [phone] After Hours Pager: [phone]

## 2018-09-10 NOTE — Progress Notes (Signed)
[place] [facility] [place] NAME: Leea Rambeau    MR#:  [id number]  DATE OF BIRTH:  1965-05-11  SUBJECTIVE:  CHIEF COMPLAINT:   Chief Complaint  Patient presents with  . Constipation  . Chest Pain  Patient seen and evaluated today Has been intubated put on ventilator by ICU attending Currently on IV pressor medication to support blood pressure On mechanical ventilator Tidal volume 500 Rate 16 FiO2 28% PEEP 5 Currently n.p.o. Plan for endoscopy by GI today  REVIEW OF SYSTEMS:    ROS  On mechanical ventilator  DRUG ALLERGIES:  No Known Allergies  VITALS:  Blood pressure 105/70, pulse 66, temperature (!) 97.4 F (36.3 C), temperature source Oral, resp. rate 16, height 5\' 6"  (1.676 m), weight 75.4 kg, SpO2 99 %.  PHYSICAL EXAMINATION:   Physical Exam  GENERAL:  53 y.o.-year-old patient lying in the bed on ventilator EYES: Pupils equal, round, reactive to light and accommodation. Has scleral icterus. Extraocular muscles intact.  HEENT: Head atraumatic, normocephalic. Oropharynx endotracheal tube noted NECK:  Supple, no jugular venous distention. No thyroid enlargement, no tenderness.  LUNGS: Improved breath sounds bilaterally, scattered rales in both lungs.  On ventilator CARDIOVASCULAR: S1, S2 normal. No murmurs, rubs, or gallops.  ABDOMEN: Soft, nontender, nondistended. Bowel sounds present. No organomegaly or mass.  EXTREMITIES: No cyanosis, clubbing or edema b/l.    NEUROLOGIC: Complete nervous system exam was not possible as patient is on ventilator PSYCHIATRIC: The patient is alert and oriented x none.  SKIN: No obvious rash, lesion, or ulcer.   LABORATORY PANEL:   CBC Recent Labs  Lab 09/23/2018 0326  WBC 16.9*  HGB 7.0*  HCT 22.0*  PLT 71*   ------------------------------------------------------------------------------------------------------------------ Chemistries  Recent Labs  Lab 09/08/2018 0326  NA 139  K  3.7  CL 117*  CO2 12*  GLUCOSE 113*  BUN 55*  CREATININE 3.02*  CALCIUM 7.6*  MG 1.9  AST 65*  ALT 27  ALKPHOS 51  BILITOT 16.7*   ------------------------------------------------------------------------------------------------------------------  Cardiac Enzymes No results for input(s): TROPONINI in the last 168 hours. ------------------------------------------------------------------------------------------------------------------  RADIOLOGY:  [place] Ekg Site Rite  Result Date: 09/09/2018 If Site Rite image not attached, placement could not be confirmed due to current cardiac rhythm.    ASSESSMENT AND PLAN:   53 year old female patientwith a known history ofhypertension, alcohol abuse presented to the emergency room for generalized weakness.Patient has been fatigued for the last 3 days and noticed dark tarry stool for the last couple of days.   Patient was tired and fatigued and had labored breathing this morning and has been electively intubated and put on ventilator by intensivist.  -Hypovolemic shock and multiorgan failure -due to active GI bleed -  Initial labs with evidence of acute blood loss (Hgb 6.5), and elevated INR, elevated troponins, and evidence of AKI (Cr 2.47, GFR 25) - on vasopressors to maintain MAP >65. Status post 2U PRBC and FFP last night.   -Acute respiratory failure with hypoxia Mechanical ventilator to continue Vent bundle Intensivist follow-up  -Multilobar pneumonia Most probably secondary to aspiration Continue IV Unasyn antibiotic F/u cultures  -Acute gastrointestinal bleeding -on octreotide and Protonix drip - melena x 2 weeks, received LR, octreotide, vitamin K, and protonix  - GI is following the patient, endoscopy today by gastroenterology -PRBC transfusion as needed -FFP transfusion as needed -Continue pentoxifylline  -Abnormal liver function tests Repeat LFTs Avoid hepatotoxic drugs Likely cholestasis from acute  infection  -Acute symptomatic  severe anemia - status post transfuse 2 units PRBC   -Acute kidney injury: Likely due to ATN due to hypotension Avoid nephrotoxic medications IV fluids and monitor renal function  -Elevated troponins  -Likely from demand ischemia  -Acute hypokalemia Potassium to be replaced aggressively  -Long-term prognosis poor  All the records are reviewed and case discussed with Care Management/Social Worker. Management plans discussed with the patient, family and they are in agreement.  CODE STATUS: Full code  DVT Prophylaxis: SCDs  TOTAL TIME TAKING CARE OF THIS PATIENT: 34 minutes.   POSSIBLE D/C IN 4 to 5 DAYS, DEPENDING ON CLINICAL CONDITION.  Saundra Shelling M.D on 09/20/2018 at 12:45 PM  Between 7am to 6pm - Pager - [phone]  After 6pm go to [email or web address] - password [email or web address] [facility]  Office  [phone]  CC: Primary care physician; Ranae Plumber, PA  Note: This dictation was prepared with Dragon dictation along with smaller phrase technology. Any transcriptional errors that result from this process are unintentional.

## 2018-09-11 ENCOUNTER — Inpatient Hospital Stay: Payer: Medicaid Other

## 2018-09-11 DIAGNOSIS — N179 Acute kidney failure, unspecified: Secondary | ICD-10-CM

## 2018-09-11 DIAGNOSIS — E876 Hypokalemia: Secondary | ICD-10-CM

## 2018-09-11 DIAGNOSIS — E8770 Fluid overload, unspecified: Secondary | ICD-10-CM

## 2018-09-11 DIAGNOSIS — K7291 Hepatic failure, unspecified with coma: Secondary | ICD-10-CM

## 2018-09-11 LAB — BPAM FFP
Blood Product Expiration Date: 202008192359
Blood Product Expiration Date: 202008192359
Blood Product Expiration Date: 202008192359
Blood Product Expiration Date: 202008192359
ISSUE DATE / TIME: 202008140418
ISSUE DATE / TIME: 202008140502
ISSUE DATE / TIME: 202008140926
ISSUE DATE / TIME: 202008141134
Unit Type and Rh: 7300
Unit Type and Rh: 7300
Unit Type and Rh: 5100
Unit Type and Rh: 5100

## 2018-09-11 LAB — BASIC METABOLIC PANEL
Anion gap: 10 (ref 5–15)
BUN: 61 mg/dL — ABNORMAL HIGH (ref 6–20)
CO2: 17 mmol/L — ABNORMAL LOW (ref 22–32)
Calcium: 7.9 mg/dL — ABNORMAL LOW (ref 8.9–10.3)
Chloride: 116 mmol/L — ABNORMAL HIGH (ref 98–111)
Creatinine, Ser: UNDETERMINED mg/dL (ref 0.44–1.00)
GFR calc Af Amer: 23 mL/min — ABNORMAL LOW (ref >60)
GFR calc non Af Amer: 20 mL/min — ABNORMAL LOW (ref >60)
Glucose, Bld: 136 mg/dL — ABNORMAL HIGH (ref 70–99)
Potassium: 3 mmol/L — ABNORMAL LOW (ref 3.5–5.1)
Sodium: 143 mmol/L (ref 135–145)

## 2018-09-11 LAB — CBC
HCT: 24.4 % — ABNORMAL LOW (ref 36.0–46.0)
Hemoglobin: 7.9 g/dL — ABNORMAL LOW (ref 12.0–15.0)
MCH: 27.5 pg (ref 26.0–34.0)
MCHC: 32.4 g/dL (ref 30.0–36.0)
MCV: 85 fL (ref 80.0–100.0)
Platelets: 62 10*3/uL — ABNORMAL LOW (ref 150–400)
RBC: 2.87 MIL/uL — ABNORMAL LOW (ref 3.87–5.11)
RDW: 19.3 % — ABNORMAL HIGH (ref 11.5–15.5)
WBC: 20.9 10*3/uL — ABNORMAL HIGH (ref 4.0–10.5)
nRBC: 0.6 % — ABNORMAL HIGH (ref 0.0–0.2)

## 2018-09-11 LAB — GLUCOSE, CAPILLARY
Glucose-Capillary: 120 mg/dL — ABNORMAL HIGH (ref 70–99)
Glucose-Capillary: 107 mg/dL — ABNORMAL HIGH (ref 70–99)
Glucose-Capillary: 94 mg/dL (ref 70–99)
Glucose-Capillary: 94 mg/dL (ref 70–99)
Glucose-Capillary: 95 mg/dL (ref 70–99)
Glucose-Capillary: 95 mg/dL (ref 70–99)

## 2018-09-11 LAB — HEPATIC FUNCTION PANEL
ALT: UNDETERMINED U/L (ref 0–44)
AST: 70 U/L — ABNORMAL HIGH (ref 15–41)
Albumin: 2.2 g/dL — ABNORMAL LOW (ref 3.5–5.0)
Alkaline Phosphatase: 54 U/L (ref 38–126)
Bilirubin, Direct: 13 mg/dL — ABNORMAL HIGH (ref 0.0–0.2)
Indirect Bilirubin: 8.4 mg/dL — ABNORMAL HIGH (ref 0.3–0.9)
Total Bilirubin: 21.4 mg/dL (ref 0.3–1.2)
Total Protein: 7.2 g/dL (ref 6.5–8.1)

## 2018-09-11 LAB — PREPARE FRESH FROZEN PLASMA: Unit division: 0

## 2018-09-11 LAB — TYPE AND SCREEN
ABO/RH(D): O POS
Antibody Screen: NEGATIVE
Unit division: 0

## 2018-09-11 LAB — PROTIME-INR
INR: 1.9 — ABNORMAL HIGH (ref 0.8–1.2)
Prothrombin Time: 21.3 seconds — ABNORMAL HIGH (ref 11.4–15.2)

## 2018-09-11 LAB — BPAM RBC
Blood Product Expiration Date: 202009072359
ISSUE DATE / TIME: 202008140630
Unit Type and Rh: 5100

## 2018-09-11 LAB — AMMONIA: Ammonia: 120 umol/L — ABNORMAL HIGH (ref 9–35)

## 2018-09-11 LAB — PHOSPHORUS: Phosphorus: 4.7 mg/dL — ABNORMAL HIGH (ref 2.5–4.6)

## 2018-09-11 LAB — MAGNESIUM: Magnesium: 1.9 mg/dL (ref 1.7–2.4)

## 2018-09-11 MED ORDER — METOPROLOL TARTRATE 5 MG/5ML IV SOLN
2.5000 mg | INTRAVENOUS | Status: DC | PRN
  Administered 2018-09-12 – 2018-09-16 (×5): 2.5 mg via INTRAVENOUS
  Filled 2018-09-11 (×4): qty 5

## 2018-09-11 MED ORDER — PHYTONADIONE 5 MG PO TABS
10.0000 mg | ORAL_TABLET | Freq: Every day | ORAL | Status: DC
  Administered 2018-09-11 – 2018-09-18 (×7): 10 mg
  Filled 2018-09-11 (×9): qty 2

## 2018-09-11 MED ORDER — THIAMINE HCL 100 MG/ML IJ SOLN
100.0000 mg | Freq: Every day | INTRAMUSCULAR | Status: DC
  Administered 2018-09-11 – 2018-09-13 (×3): 100 mg via INTRAVENOUS
  Filled 2018-09-11 (×3): qty 2

## 2018-09-11 MED ORDER — POTASSIUM CHLORIDE 20 MEQ/15ML (10%) PO SOLN
40.0000 meq | Freq: Once | ORAL | Status: AC
  Administered 2018-09-11: 40 meq
  Filled 2018-09-11: qty 30

## 2018-09-11 MED ORDER — VITAL AF 1.2 CAL PO LIQD
1000.0000 mL | ORAL | Status: DC
  Administered 2018-09-13: 1000 mL

## 2018-09-11 MED ORDER — POTASSIUM CHLORIDE 10 MEQ/50ML IV SOLN
10.0000 meq | INTRAVENOUS | Status: DC
  Administered 2018-09-11 (×4): 10 meq via INTRAVENOUS
  Filled 2018-09-11 (×4): qty 50

## 2018-09-11 MED ORDER — FUROSEMIDE 10 MG/ML IJ SOLN
80.0000 mg | Freq: Once | INTRAMUSCULAR | Status: AC
  Administered 2018-09-11: 80 mg via INTRAVENOUS
  Filled 2018-09-11: qty 8

## 2018-09-11 MED ORDER — SODIUM BICARBONATE 650 MG PO TABS
1300.0000 mg | ORAL_TABLET | Freq: Two times a day (BID) | ORAL | Status: DC
  Administered 2018-09-11 – 2018-09-13 (×5): 1300 mg via ORAL
  Filled 2018-09-11 (×6): qty 2

## 2018-09-11 MED ORDER — FREE WATER
200.0000 mL | Status: DC
  Administered 2018-09-11 – 2018-09-18 (×43): 200 mL

## 2018-09-11 MED ORDER — SODIUM CHLORIDE 0.9 % IV SOLN
3.0000 g | Freq: Four times a day (QID) | INTRAVENOUS | Status: DC
  Filled 2018-09-11 (×3): qty 8

## 2018-09-11 NOTE — Progress Notes (Addendum)
PULMONARY/CCM PROGRESS NOTE  PT PROFILE: 53 y.o. F adm 08/05 with hypovolemic shock due to melena, weakness with a history of alcoholic cirrhosis.  Other problems on admission include AKI, lactic acidosis, elevated troponin.  MAJOR EVENTS/TEST RESULTS: 08/05 gastroenterology consultation for GI bleeding 08/05 cardiology consultation for elevated troponin 08/06 abdominal ultrasound: No ascites 08/08 pelvic ultrasound: Normal 08/08 intubated for respiratory distress, suspected aspiration 08/11 echocardiogram: LVEF 50-55%.  LV cavity mildly dilated.  Moderate LVH.  Doppler parameters consistent with diastolic dysfunction.  LA moderately dilated. 08/13 palliative care consultation 08/14 EGD: Distal esophagitis without active bleeding.  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  No endoscopic evidence of varices in the entire examined stomach. 08/15 CT head: normal  INDWELLING DEVICES: R femoral CVL 08/05 >> 08/13 ETT 08/08 >> 08/13 RUE PICC 08/13 >>   MICRO DATA: SARS-CoV-2 PCR 8/5 >>negative MRSA PCR 8/5>> negative Resp 8/9 >> MSSA Blood 8/5 >> negative Blood 8/9 >>negative  ANTIMICROBIALS:  Vancomycin 8/5 >> 8/6 Cefepime 8/5 >> 8/11 Metronidazole 8/6 >> 8/11 Ampicillin-sulbactam 8/11 >> 08/15  SUBJ: Remains minimally responsive and receiving minimal sedation. Synchronous with vent   OBJ: Vitals:   09/11/18 0600 09/11/18 0740 09/11/18 0800 09/11/18 0900  BP: 112/63 135/73 133/72 120/74  Pulse: 84 92 93 91  Resp: '17 18 18 18  ' Temp:  97.7 F (36.5 C)    TempSrc:  Axillary    SpO2: 95% 96% 95% 96%  Weight:      Height:       Vent Mode: PRVC FiO2 (%):  [28 %] 28 % Set Rate:  [16 bmp] 16 bmp Vt Set:  [500 mL] 500 mL PEEP:  [5 cmH20] 5 cmH20 Plateau Pressure:  [16 cmH20-25 cmH20] 25 cmH20   Gen: RASS -4, -5, intubated, synchronous with ventilator HEENT: NCAT, sclericterus Neck: No JVD noted Lungs: Scattered rhonchi, no  wheezes Cardiovascular: Regular, no M Abdomen: Mildly-moderately distended, not overtly tender, + BS Ext: Bilateral symmetric UEs and LE edema, extremities warm Neuro: Minimal spontaneous movement.  Downward gaze. Skin: No lesions noted  BMP Latest Ref Rng & Units 09/11/2018 09/27/2018 09/09/2018  Glucose 70 - 99 mg/dL 136(H) 113(H) 126(H)  BUN 6 - 20 mg/dL 61(H) 55(H) 51(H)  Creatinine 0.44 - 1.00 mg/dL 2.68(H) 3.02(H) 2.72(H)  Sodium 135 - 145 mmol/L 143 139 137  Potassium 3.5 - 5.1 mmol/L 3.0(L) 3.7 3.9  Chloride 98 - 111 mmol/L 116(H) 117(H) 116(H)  CO2 22 - 32 mmol/L 17(L) 12(L) 13(L)  Calcium 8.9 - 10.3 mg/dL 7.9(L) 7.6(L) 7.0(L)    Hepatic Function Latest Ref Rng & Units 09/11/2018 09/03/2018 09/08/2018  Total Protein 6.5 - 8.1 g/dL 7.2 6.6 6.9  Albumin 3.5 - 5.0 g/dL 2.2(L) 2.1(L) 2.1(L)  AST 15 - 41 U/L 70(H) 65(H) 43(H)  ALT 0 - 44 U/L 32 27 19  Alk Phosphatase 38 - 126 U/L 54 51 67  Total Bilirubin 0.3 - 1.2 mg/dL 21.4(HH) 16.7(H) 15.7(H)  Bilirubin, Direct 0.0 - 0.2 mg/dL 13.0(H) - -    CBC Latest Ref Rng & Units 09/11/2018 09/13/2018 08/30/2018  WBC 4.0 - 10.5 K/uL 20.9(H) - -  Hemoglobin 12.0 - 15.0 g/dL 7.9(L) 8.1(L) 7.5(L)  Hematocrit 36.0 - 46.0 % 24.4(L) 24.6(L) 22.9(L)  Platelets 150 - 400 K/uL 62(L) - -    ABG    Component Value Date/Time   PHART 7.24 (L) 09/04/2018 1150   PCO2ART 44 09/04/2018 1150   PO2ART 97 09/04/2018 1150   HCO3 18.9 (  L) 09/04/2018 1150   ACIDBASEDEF 8.3 (H) 09/04/2018 1150   O2SAT 96.2 09/04/2018 1150   Ammonia 120  CXR: Severe edema pattern   IMPRESSION: Ventilator dependent respiratory failure Aspiration pneumonia - now fully treated Hemorrhagic shock, resolved Oliguric AKI - Cr stable Metabolic acidosis - improved Hypokalemia Severe alcoholic cirrhosis UGI bleed due to portal gastropathy.  Appears to be resolved Acute blood loss anemia - no overt bleeding presently Thrombocytopenia Coagulopathy due to liver  failure Acute encephalopathy.  Likely hepatic in origin Hyperammonemia  PLAN/REC: Cont vent support - settings reviewed and/or adjusted PSV mode as toelrated Cont vent bundle Daily SBT if/when meets criteria ICU hemodynamic monitoring.  MAP goal >65 mmHg Discontinue hydrocortisone 8/15 Monitor BMET intermittently Monitor I/Os Correct electrolytes as indicated Change bicarbonate infusion to enteral form 08/15 Furosemide x 1 dose 08/15 Continue TF protocol Continue pantoprazole Monitor LFTs intermittently Monitor temp, WBC count Micro and abx as above DVT px: SCDs Monitor CBC intermittently Transfuse per usual guidelines Monitor coags intermittently Initiate daily enteral vitamin K 8/15 Continue lactulose and rifaximin - initiated 08/14  I will meet with patient's husband later this afternoon to address goals of care  CCM time: 35 mins The above time includes time spent in consultation with patient and/or family members and reviewing care plan on multidisciplinary rounds  Merton Border, MD PCCM service Mobile [phone] Pager [phone] 09/11/2018 12:07 PM

## 2018-09-11 NOTE — Progress Notes (Signed)
[place] [facility] [place] NAME: Ashely Goosby    MR#:  [id number]  DATE OF BIRTH:  [birth year]  SUBJECTIVE:  Patient remains ventilated.  She is not on sedation however unable to obtain gag reflex or cough reflex.  Patient went for CT scan of the head this morning which essentially was unremarkable.  REVIEW OF SYSTEMS:    ROS  On mechanical ventilator  DRUG ALLERGIES:  No Known Allergies  VITALS:  Blood pressure 120/74, pulse 91, temperature 97.7 F (36.5 C), temperature source Axillary, resp. rate 18, height 5\' 6"  (1.676 m), weight 100.3 kg, SpO2 96 %.  PHYSICAL EXAMINATION:   Physical Exam  GENERAL:  53 y.o.-year-old patient lying in the bed on ventilator EYES: Pupils 3 mm barely reactive.  HEENT: Head atraumatic, normocephalic. Oropharynx endotracheal tube noted NECK:  Supple, no jugular venous distention. No thyroid enlargement, no tenderness.  LUNGS: Bilateral rhonchi   cARDIOVASCULAR: S1, S2 normal. No murmurs, rubs, or gallops.  ABDOMEN: Soft, nontender, nondistended. Bowel sounds present. No organomegaly or mass.  EXTREMITIES: No cyanosis, clubbing or edema b/l.    NEUROLOGIC: Complete nervous system exam was not possible as patient is on ventilator PSYCHIATRIC: The patient is sedated on vent SKIN: No obvious rash, lesion, or ulcer.   LABORATORY PANEL:   CBC Recent Labs  Lab 09/11/18 0343  WBC 20.9*  HGB 7.9*  HCT 24.4*  PLT 62*   ------------------------------------------------------------------------------------------------------------------ Chemistries  Recent Labs  Lab 09/11/18 0343  NA 143  K 3.0*  CL 116*  CO2 17*  GLUCOSE 136*  BUN 61*  CREATININE 2.68*  CALCIUM 7.9*  MG 1.9  AST 70*  ALT 32  ALKPHOS 54  BILITOT 21.4*   ------------------------------------------------------------------------------------------------------------------  Cardiac Enzymes No results for input(s): TROPONINI in the  last 168 hours. ------------------------------------------------------------------------------------------------------------------  RADIOLOGY:  Dg Abd 1 View  Result Date: 09/06/2018 CLINICAL DATA:  OG tube placement EXAM: ABDOMEN - 1 VIEW COMPARISON:  None. FINDINGS: OG tube appears adequately position in the stomach. Visualized bowel gas pattern is nonobstructive. IMPRESSION: OG tube appears adequately position in the stomach. Electronically Signed   By: Franki Cabot M.D.   On: 09/27/2018 18:55   Ct Head Wo Contrast  Result Date: 09/11/2018 CLINICAL DATA:  Altered level of consciousness, unexplained EXAM: CT HEAD WITHOUT CONTRAST TECHNIQUE: Contiguous axial images were obtained from the base of the skull through the vertex without intravenous contrast. COMPARISON:  12/17/2009 FINDINGS: Brain: No evidence of acute infarction, hemorrhage, hydrocephalus, extra-axial collection or mass lesion/mass effect. Mild cerebral volume loss. Vascular: No hyperdense vessel or unexpected calcification. Skull: Normal. Negative for fracture or focal lesion. Heterogeneous bony density at the central skull base that is unchanged from prior and considered benign. Sinuses/Orbits: No acute finding. IMPRESSION: Stable head CT.  No acute finding. Electronically Signed   By: Monte Fantasia M.D.   On: 09/11/2018 11:21   Dg Chest Port 1 View  Result Date: 09/11/2018 CLINICAL DATA:  Respiratory failure EXAM: PORTABLE CHEST 1 VIEW COMPARISON:  Three days ago FINDINGS: Endotracheal tube tip is ~2 cm above the carina. Right PICC with tip at the SVC. The orogastric tube at least reaches the stomach. Artifact from EKG leads. Extensive bilateral airspace disease. Cardiomegaly. Possible layering pleural effusions. No pneumothorax. IMPRESSION: 1. New hardware is in good position. 2. Extensive bilateral airspace disease likely a combination of edema and pneumonia. Electronically Signed   By: Monte Fantasia M.D.   On: 09/11/2018 07:23  ASSESSMENT AND PLAN:   53 year old female patientwith a known history ofhypertension, alcohol abuse presented to the emergency room for generalized weakness.Patient has been fatigued for the last 3 days and noticed dark tarry stool for the last couple of days.   Patient was tired and fatigued and had labored breathing this morning and has been electively intubated and put on ventilator by intensivist.  -Hypovolemic shock and multiorgan failure -due to active GI bleed -  Initial labs with evidence of acute blood loss (Hgb 6.5), and elevated INR, elevated troponins, and evidence of AKI (Cr 2.47, GFR 25) - on vasopressors to maintain MAP >65. Status post 2U PRBC and FFP    -Acute respiratory failure with hypoxia Mechanical ventilator to continue HPI as per intensivist  -Multilobar pneumonia Continue Unasyn  -Acute gastrointestinal bleeding with acute blood loss anemia EGD shows with LA grade C esophagitis with no bleeding and severe portal hypertensive gastropathy.  No endoscopic evidence of varices.  1 nonbleeding cratered duodenal ulcer with no stigmata of bleeding. Recommendation for IV PPI She is status post 2 unit PRBC   -Abnormal liver function tests Due to hypovolemic shock   -Acute kidney injury:  Due to ATN from hypovolemic shock No nephrotoxic medications.  -Elevated troponins : Due to demand ischemia from hypovolemic shock  -Acute hypokalemia: Replete PRN   Overall prognosis very poor.  Intensivist to meet with family today.    CODE STATUS: Full code  DVT Prophylaxis: SCDs  TOTAL TIME TAKING CARE OF THIS PATIENT: 30 minutes.   POSSIBLE D/C IN?? , DEPENDING ON CLINICAL CONDITION.  [name] M.D on 09/11/2018 at 1:15 PM  Between 7am to 6pm - Pager - [phone]  After 6pm go to [email or web address] - password [email or web address] [facility]  Office  [phone]  CC: Primary care physician; Ranae Plumber, PA  Note: This dictation was  prepared with Dragon dictation along with smaller phrase technology. Any transcriptional errors that result from this process are unintentional.

## 2018-09-11 NOTE — Progress Notes (Signed)
Pt transports to CT on Trilogy ventilator w/o incident. Pt placed back on Servo i

## 2018-09-11 NOTE — Progress Notes (Signed)
Pharmacy Antibiotic Note  Mackenzie Little is a 53 y.o. female admitted on 09/20/2018. Patient initially started on ceftriaxone, cefepime, metronidazole, and vancomycin for sepsis. Patient intubated, requiring pressors, and on multiple infusions secondary to GI bleed. During AM rounds, patient noted to have possible aspiration pneumonia and patient to be transitioned to ampicillin-sulbactam. Pharmacy has been consulted for ampicillin-sulbactam dosing.  Plan: Creatinine clearance is improving; will transition patient to Unasyn 3g IV Q6hr. Will follow up with serum creatinine with am labs.   Height: 5\' 6"  (167.6 cm) Weight: 221 lb 1.9 oz (100.3 kg) IBW/kg (Calculated) : 59.3  Temp (24hrs), Avg:97.5 F (36.4 C), Min:97.3 F (36.3 C), Max:97.7 F (36.5 C)  Recent Labs  Lab 09/07/18 0425 09/07/18 1131 09/08/18 0405 09/09/18 0322 09/02/2018 0326 09/11/18 0343  WBC 28.9*  --  35.2* 27.6* 16.9* 20.9*  CREATININE 1.20*  --  1.92* 2.72* 3.02* 2.68*  LATICACIDVEN  --  2.1* 1.8  --   --   --     Estimated Creatinine Clearance: 29.7 mL/min (A) (by C-G formula based on SCr of 2.68 mg/dL (H)).    No Known Allergies  Antimicrobials this admission: Ceftriaxone 08/05 x 1 Cefepime 08/05 >> 08/11 Vancomycin 08/05 x 1  Metronidazole 08/06 >> 08/11 Unasyn 08/11 >>  Dose adjustments this admission: 08/13 Unasyn changed from [place] to Q12H 08/15 Unasyn changed from Q12H to [facility]  Microbiology results: 08/05 BCx: no growth  08/09 BCx: no growth x 4 days 08/09 Respiratory Cx: rare candida albicans; few staph aureus (sensitive to oxacillin) 08/05 MRSA PCR: negative  Thank you for allowing pharmacy to be a part of this patient's care.  Simpson,Michael L 09/11/2018 9:42 AM

## 2018-09-11 NOTE — Progress Notes (Addendum)
Patient has been unresponsive most of the shift. She seemed to twitch once while her family was here during mouth care. She is not on any medications for sedation nor for pain.  MD said to hold all sedation & pain medications.  Patient had watery stools multiple times beginning in CT around 10:30am. MD ordered a flexi-seal.  Eyes leaking clear, yellow discharge.  Mouth care done extra because of drooling.  Right corner of mouth has a small crack.  RT and RN inserted a bite block to ease tension on her mouth and prevent biting the tubes.  Husband and daughter visited and spoke with the MD.  He suggested we continue to try a few more days, and then if she has not improved by Thursday, to reevaluate her situation.  Family was receptive to MD.  RN gave emotional support.  Family requested chaplain pray over their mom (patient).   Husband and daughter may continue to visit.  Patient might go comfort care Thursday.  Family was informed they can have the same two plus two more visitors if the patient goes on comfort care.  MD told husband and daughter they could both visit because patient might not recover.  LAB called and said some values would be removed because they were not accurate.  (ALT & Creatinine)   Phillis Knack, RN

## 2018-09-11 NOTE — Progress Notes (Signed)
Pt placed back on PRVC for overnight per req of NP.

## 2018-09-12 ENCOUNTER — Inpatient Hospital Stay: Payer: Medicaid Other

## 2018-09-12 DIAGNOSIS — R40243 Glasgow coma scale score 3-8, unspecified time: Secondary | ICD-10-CM

## 2018-09-12 DIAGNOSIS — G9341 Metabolic encephalopathy: Secondary | ICD-10-CM

## 2018-09-12 LAB — GLUCOSE, CAPILLARY
Glucose-Capillary: 79 mg/dL (ref 70–99)
Glucose-Capillary: 76 mg/dL (ref 70–99)
Glucose-Capillary: 84 mg/dL (ref 70–99)
Glucose-Capillary: 84 mg/dL (ref 70–99)
Glucose-Capillary: 98 mg/dL (ref 70–99)
Glucose-Capillary: 106 mg/dL — ABNORMAL HIGH (ref 70–99)

## 2018-09-12 LAB — COMPREHENSIVE METABOLIC PANEL
ALT: UNDETERMINED U/L (ref 0–44)
AST: 98 U/L — ABNORMAL HIGH (ref 15–41)
Albumin: 2.1 g/dL — ABNORMAL LOW (ref 3.5–5.0)
Alkaline Phosphatase: 86 U/L (ref 38–126)
Anion gap: 8 (ref 5–15)
BUN: 67 mg/dL — ABNORMAL HIGH (ref 6–20)
CO2: 18 mmol/L — ABNORMAL LOW (ref 22–32)
Calcium: 8.4 mg/dL — ABNORMAL LOW (ref 8.9–10.3)
Chloride: 117 mmol/L — ABNORMAL HIGH (ref 98–111)
Creatinine, Ser: UNDETERMINED mg/dL (ref 0.44–1.00)
Glucose, Bld: 84 mg/dL (ref 70–99)
Potassium: 2.8 mmol/L — ABNORMAL LOW (ref 3.5–5.1)
Sodium: 143 mmol/L (ref 135–145)
Total Bilirubin: 26.2 mg/dL (ref 0.3–1.2)
Total Protein: 7.1 g/dL (ref 6.5–8.1)

## 2018-09-12 LAB — CBC
HCT: 23.6 % — ABNORMAL LOW (ref 36.0–46.0)
Hemoglobin: 8 g/dL — ABNORMAL LOW (ref 12.0–15.0)
MCH: 27.7 pg (ref 26.0–34.0)
MCHC: 33.9 g/dL (ref 30.0–36.0)
MCV: 81.7 fL (ref 80.0–100.0)
Platelets: 73 10*3/uL — ABNORMAL LOW (ref 150–400)
RBC: 2.89 MIL/uL — ABNORMAL LOW (ref 3.87–5.11)
RDW: 19.4 % — ABNORMAL HIGH (ref 11.5–15.5)
WBC: 27.7 10*3/uL — ABNORMAL HIGH (ref 4.0–10.5)
nRBC: 0.8 % — ABNORMAL HIGH (ref 0.0–0.2)

## 2018-09-12 MED ORDER — PANTOPRAZOLE SODIUM 40 MG PO PACK
40.0000 mg | PACK | Freq: Every day | ORAL | Status: DC
  Administered 2018-09-12 – 2018-09-18 (×7): 40 mg
  Filled 2018-09-12 (×7): qty 20

## 2018-09-12 MED ORDER — POTASSIUM CHLORIDE 20 MEQ/15ML (10%) PO SOLN
40.0000 meq | Freq: Two times a day (BID) | ORAL | Status: AC
  Administered 2018-09-12 (×2): 40 meq
  Filled 2018-09-12 (×2): qty 30

## 2018-09-12 NOTE — Progress Notes (Signed)
PULMONARY/CCM PROGRESS NOTE  PT PROFILE: 53 y.o. F adm 08/05 with hypovolemic shock due to melena, weakness with a history of alcoholic cirrhosis.  Other problems on admission include AKI, lactic acidosis, elevated troponin.  MAJOR EVENTS/TEST RESULTS: 08/05 gastroenterology consultation for GI bleeding 08/05 cardiology consultation for elevated troponin 08/06 abdominal ultrasound: No ascites 08/08 pelvic ultrasound: Normal 08/08 intubated for respiratory distress, suspected aspiration 08/11 echocardiogram: LVEF 50-55%.  LV cavity mildly dilated.  Moderate LVH.  Doppler parameters consistent with diastolic dysfunction.  LA moderately dilated. 08/13 palliative care consultation 08/14 EGD: Distal esophagitis without active bleeding.  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  No endoscopic evidence of varices in the entire examined stomach. 08/15 CT head: normal 08/16 Neurology consultation: Suspect severe TME. Prognosis deemed to likely be poor  INDWELLING DEVICES: R femoral CVL 08/05 >> 08/13 ETT 08/08 >>  RUE PICC 08/13 >>   MICRO DATA: SARS-CoV-2 PCR 8/5 >>negative MRSA PCR 8/5>> negative Resp 8/9 >> MSSA Blood 8/5 >> negative Blood 8/9 >>negative  ANTIMICROBIALS:  Vancomycin 8/5 >> 8/6 Cefepime 8/5 >> 8/11 Metronidazole 8/6 >> 8/11 Ampicillin-sulbactam 8/11 >> 08/15  SUBJ: Remains fully unresponsive.  Synchronous with ventilator.  Tolerates PSV mode   OBJ: Vitals:   09/12/18 1000 09/12/18 1100 09/12/18 1200 09/12/18 1203  BP: (!) 142/83 (!) 148/78 (!) 149/75   Pulse: 98 100 100   Resp: (!) 27 (!) 27 18   Temp:   98.6 F (37 C)   TempSrc:   Axillary   SpO2: 93% 95% 92% 96%  Weight:      Height:       Vent Mode: PSV FiO2 (%):  [28 %] 28 % Set Rate:  [16 bmp] 16 bmp Vt Set:  [500 mL] 500 mL PEEP:  [5 cmH20] 5 cmH20 Pressure Support:  [10 cmH20] 10 cmH20   Gen: RASS -4, -5, intubated, synchronous with ventilator HEENT: NCAT,  severe sclericterus Neck: No JVD noted Lungs: Clear anteriorly, no wheezes Cardiovascular: Regular, no M Abdomen: Mildly-moderately distended, soft, diminished BS Ext: Bilateral symmetric UEs and LE edema, extremities warm Neuro: Minimal spontaneous movement.  Tends to have downward gaze.  Oculocephalics intact.  Pupils react. Skin: No lesions noted  BMP Latest Ref Rng & Units 09/12/2018 09/11/2018 09/06/2018  Glucose 70 - 99 mg/dL 84 136(H) 113(H)  BUN 6 - 20 mg/dL 67(H) 61(H) 55(H)  Creatinine 0.44 - 1.00 mg/dL UNABLE TO REPORT DUE TO ICTERUS UNABLE TO REPORT DUE TO ICTERIC INTERFERENCE. 3.02(H)  Sodium 135 - 145 mmol/L 143 143 139  Potassium 3.5 - 5.1 mmol/L 2.8(L) 3.0(L) 3.7  Chloride 98 - 111 mmol/L 117(H) 116(H) 117(H)  CO2 22 - 32 mmol/L 18(L) 17(L) 12(L)  Calcium 8.9 - 10.3 mg/dL 8.4(L) 7.9(L) 7.6(L)    Hepatic Function Latest Ref Rng & Units 09/12/2018 09/11/2018 09/05/2018  Total Protein 6.5 - 8.1 g/dL 7.1 7.2 6.6  Albumin 3.5 - 5.0 g/dL 2.1(L) 2.2(L) 2.1(L)  AST 15 - 41 U/L 98(H) 70(H) 65(H)  ALT 0 - 44 U/L UNABLE TO REPORT DUE TO ICTERUS UNABLE TO REPORT DUE TO ICTERIC INTERFERENCE 27  Alk Phosphatase 38 - 126 U/L 86 54 51  Total Bilirubin 0.3 - 1.2 mg/dL 26.2(HH) 21.4(HH) 16.7(H)  Bilirubin, Direct 0.0 - 0.2 mg/dL - 13.0(H) -    CBC Latest Ref Rng & Units 09/12/2018 09/11/2018 09/25/2018  WBC 4.0 - 10.5 K/uL 27.7(H) 20.9(H) -  Hemoglobin 12.0 - 15.0 g/dL 8.0(L) 7.9(L) 8.1(L)  Hematocrit 36.0 -  46.0 % 23.6(L) 24.4(L) 24.6(L)  Platelets 150 - 400 K/uL 73(L) 62(L) -    ABG    Component Value Date/Time   PHART 7.24 (L) 09/04/2018 1150   PCO2ART 44 09/04/2018 1150   PO2ART 97 09/04/2018 1150   HCO3 18.9 (L) 09/04/2018 1150   ACIDBASEDEF 8.3 (H) 09/04/2018 1150   O2SAT 96.2 09/04/2018 1150   Ammonia 120  CXR: Rotated film.  R >L airspace disease    IMPRESSION: Ventilator dependent respiratory failure Aspiration pneumonia - now fully treated Hemorrhagic shock,  resolved AKI -responded well to furosemide. Cr cannot be tracked due to very bilirubin Metabolic acidosis, now mild Hypokalemia Severe alcoholic cirrhosis UGI bleed due to portal gastropathy.  Bleeding appears to be resolved Acute blood loss anemia - no overt bleeding presently Moderate thrombocytopenia Coagulopathy due to liver failure Severe acute encephalopathy.  Likely hepatic encephalopathy Severe hyperammonemia  PLAN/REC: Cont vent support - settings reviewed and/or adjusted PSV mode as tolerated Cont vent bundle Daily SBT if/when meets criteria ICU hemodynamic monitoring.  MAP goal > 65 mmHg Monitor BMET intermittently Monitor I/Os Correct electrolytes as indicated Cont enteral bicarbonate K+ repleted 08/16 Continue TF protocol Change pantoprazole to enteral Monitor LFTs intermittently Monitor temp, WBC count Micro and abx as above DVT px: SCDs Monitor CBC intermittently Transfuse per usual guidelines Monitor coags intermittently Initiate daily vitamin K 8/15 Continue lactulose and rifaximin - initiated 08/14  No family at bedside  CCM time: 32 mins The above time includes time spent in consultation with patient and/or family members and reviewing care plan on multidisciplinary rounds  Merton Border, MD PCCM service Mobile [phone] Pager [phone] 09/12/2018 1:28 PM

## 2018-09-12 NOTE — Consult Note (Signed)
Reason for Consult:AMS Referring Physician: Alva Garnet  CC: AMS  HPI: Mackenzie Little is an 53 y.o. female who is unresponsive and unable to provide any history.  All history obtained from the chart.  Patient with a history of alcoholic cirrhosis.  Admitted on 8/5 with hypovolemic shock due to melena.  Patient also noted to have AKI and lactic acidosis. On 8/8 went into respiratory distress felt secondary to aspiration PNA requiring intubation.  Has shown evidence of multiorgan injury.  Also noted to have severe portal hypertensive gastropathy.  Patient hyperammonemic and with markedly elevated bilirubin as well.  Has remained poorly responsive.    Past Medical History:  Diagnosis Date  . Hypertension     Past Surgical History:  Procedure Laterality Date  . COLONOSCOPY N/A 07/07/2017   Procedure: COLONOSCOPY;  Surgeon: Lin Landsman, MD;  Location: [facility] ENDOSCOPY;  Service: Gastroenterology;  Laterality: N/A;  . ESOPHAGOGASTRODUODENOSCOPY N/A 07/07/2017   Procedure: ESOPHAGOGASTRODUODENOSCOPY (EGD);  Surgeon: Lin Landsman, MD;  Location: [facility] ENDOSCOPY;  Service: Gastroenterology;  Laterality: N/A;  . TOOTH EXTRACTION  May 2016    Family history: Patient unable to provide due to mental status  Social History:  reports that she has quit smoking. She has never used smokeless tobacco. She reports current alcohol use. She reports that she does not use drugs.  No Known Allergies  Medications:  I have reviewed the patient's current medications. Prior to Admission:  Medications Prior to Admission  Medication Sig Dispense Refill Last Dose  . acetaminophen (TYLENOL) 325 MG tablet Take 650 mg by mouth every 6 (six) hours as needed.   08/31/2018 at Unknown time   Scheduled: . chlorhexidine gluconate (MEDLINE KIT)  15 mL Mouth Rinse BID  . Chlorhexidine Gluconate Cloth  6 each Topical QHS  . folic acid  1 mg Intravenous Daily  . free water  200 mL Per Tube Q4H  . lactulose  30 g  Per Tube TID  . mouth rinse  15 mL Mouth Rinse 10 times per day  . pantoprazole  40 mg Intravenous Q12H  . phytonadione  10 mg Per Tube Daily  . potassium chloride  40 mEq Per Tube BID  . rifaximin  550 mg Per Tube BID  . sodium bicarbonate  1,300 mg Oral BID  . sodium chloride flush  10-40 mL Intracatheter Q12H  . thiamine injection  100 mg Intravenous Daily    ROS: Patient unable to provide due to mental status  Physical Examination: Blood pressure 132/78, pulse 90, temperature 98.6 F (37 C), temperature source Axillary, resp. rate (!) 28, height _0  (1.676 m), weight 103.1 kg, SpO2 96 %.  HEENT-  Normocephalic, no lesions, without obvious abnormality.  Jaundiced.  Normal TM's bilaterally.  Normal auditory canals and external ears. Normal external nose, mucus membranes and septum.  Normal pharynx. Cardiovascular- S1, S2 normal, pulses palpable throughout   Lungs- chest clear, no wheezing, rales, normal symmetric air entry Abdomen- soft, non-tender; bowel sounds normal; no masses,  no organomegaly Extremities- mild LE edema Lymph-no adenopathy palpable Musculoskeletal-no joint tenderness, deformity or swelling Skin-warm and dry, no hyperpigmentation, vitiligo, or suspicious lesions  Neurological Examination   Mental Status: Patient does not respond to verbal stimuli.  Does not respond to deep sternal rub.  Does not follow commands.  No verbalizations noted.  Cranial Nerves: II: Blinks to bilateral confrontation bilaterally, pupils right 3 mm, left 3 mm,and reactive bilaterally III,IV,VI: Oculocephalic response present bilaterally.  Downward gaze at rest.  V,VII: corneal reflex reduced bilaterally but present  VIII: patient does not respond to verbal stimuli IX,X: gag reflex unable to test, XI: trapezius strength unable to test bilaterally XII: tongue strength unable to test Motor: Extremities flaccid throughout.  No spontaneous movement noted.  No purposeful movements  noted. Sensory: Does not respond to noxious stimuli in any extremity. Deep Tendon Reflexes:  Absent throughout. Plantars: Mute bilaterally Cerebellar: Unable to perform  Laboratory Studies:   Basic Metabolic Panel: Recent Labs  Lab 09/06/18 0356 09/07/18 0425 09/08/18 0405 09/09/18 0322 09/04/2018 0326 09/11/18 0343 09/12/18 0504  NA 143 143 136 137 139 143 143  K 4.0 3.8 4.0 3.9 3.7 3.0* 2.8*  CL 120* 120* 116* 116* 117* 116* 117*  CO2 17* 15* 14* 13* 12* 17* 18*  GLUCOSE 121* 105* 150* 126* 113* 136* 84  BUN 38* 39* 41* 51* 55* 61* 67*  CREATININE 1.23* 1.20* 1.92* 2.72* 3.02* UNABLE TO REPORT DUE TO ICTERIC INTERFERENCE. UNABLE TO REPORT DUE TO ICTERUS  CALCIUM 6.7* 7.0* 6.7* 7.0* 7.6* 7.9* 8.4*  MG 2.3 2.1  --  2.1 1.9 1.9  --   PHOS 4.1  --   --  5.1* 5.2* 4.7*  --     Liver Function Tests: Recent Labs  Lab 09/07/18 0425 09/08/18 0405 09/03/2018 0326 09/11/18 0343 09/12/18 0504  AST 36 43* 65* 70* 98*  ALT _0 UNABLE TO REPORT DUE TO ICTERIC INTERFERENCE UNABLE TO REPORT DUE TO ICTERUS  ALKPHOS 63 67 51 54 86  BILITOT 13.5* 15.7* 16.7* 21.4* 26.2*  PROT 6.4* 6.9 6.6 7.2 7.1  ALBUMIN 1.7* 2.1* 2.1* 2.2* 2.1*   No results for input(s): LIPASE, AMYLASE in the last 168 hours. Recent Labs  Lab 09/11/18 0343  AMMONIA 120*    CBC: Recent Labs  Lab 09/07/18 0425  09/08/18 0405  09/09/18 0322  09/14/2018 0326 09/25/2018 1233 08/30/2018 2018 09/11/18 0343 09/12/18 0504  WBC 28.9*  --  35.2*  --  27.6*  --  16.9*  --   --  20.9* 27.7*  NEUTROABS 24.6*  --   --   --   --   --  14.3*  --   --   --   --   HGB 7.7*   < > 8.1*   < > 7.5*   < > 7.0* 7.5* 8.1* 7.9* 8.0*  HCT 23.8*   < > 25.7*   < > 23.8*   < > 22.0* 22.9* 24.6* 24.4* 23.6*  MCV 83.8  --  86.0  --  85.3  --  83.7  --   --  85.0 81.7  PLT 111*  --  125*  --  96*  --  71*  --   --  62* 73*   < > = values in this interval not displayed.    Cardiac Enzymes: No results for input(s): CKTOTAL, CKMB,  CKMBINDEX, TROPONINI in the last 168 hours.  BNP: Invalid input(s): POCBNP  CBG: Recent Labs  Lab 09/11/18 1603 09/11/18 1957 09/11/18 2323 09/12/18 0358 09/12/18 0752  GLUCAP 94 95 95 79 76    Microbiology: Results for orders placed or performed during the hospital encounter of 09/26/2018  Culture, blood (routine x 2)     Status: None   Collection Time: 09/15/2018  1:25 PM   Specimen: BLOOD  Result Value Ref Range Status   Specimen Description BLOOD LEFT ANTECUBITAL  Final   Special Requests   Final    BOTTLES  DRAWN AEROBIC AND ANAEROBIC Blood Culture adequate volume   Culture   Final    NO GROWTH 5 DAYS Performed at [facility], [address] [place], [place] [address]    Report Status 09/06/2018 FINAL  Final  Culture, blood (routine x 2)     Status: None   Collection Time: 09/18/2018  1:39 PM   Specimen: BLOOD  Result Value Ref Range Status   Specimen Description BLOOD RIGHT ANTECUBITAL  Final   Special Requests   Final    BOTTLES DRAWN AEROBIC AND ANAEROBIC Blood Culture results may not be optimal due to an excessive volume of blood received in culture bottles   Culture   Final    NO GROWTH 5 DAYS Performed at [facility], [address] [place], [place] [address]    Report Status 09/06/2018 FINAL  Final  SARS Coronavirus 2 [facility] order, Performed in [place] hospital lab)     Status: None   Collection Time: 09/15/2018  4:22 PM  Result Value Ref Range Status   SARS Coronavirus 2 NEGATIVE NEGATIVE Final    Comment: (NOTE) SARS CoV 2 target nucleic acids are NOT DETECTED. The SARS CoV 2 RNA is generally detectable in upper and lower respiratory specimens during the acute phase of infection. The lowest concentration of SARS CoV 2 viral copies this assay can detect is 250 copies per mL. A negative result does not preclude SARS CoV 2 infection and should not be used as the sole basis for treatment or other patient management decisions. A  negative result may occur with improper specimen collection and handling, submission  of specimen other than nasopharyngeal swab, presence of viral mutation(s) within the areas targeted by this assay, and inadequate number of viral copies (less than 250 copies per mL). A negative result must be combined with clinical observations, patient history,  and epidemiological information. The expected result is Negative. Fact Sheet for Patients:   https [employer] 841324 download Fact Sheet for Healthcare Providers:   https [employer] media (239)532-6982 d Jenel Lucks This test is not yet approved or cleared by the [place] FDA and  has been authorized for detection and/or diagnosis of SARS CoV 2 by FDA under an Emergency Use Authorization (EUA).  This EUA will remain in effect (meaning this test can be used) for the duration of  the COVID19 declaration under Section 564(b)(1) of the Act, 21 U.S.C.  section 360bbb-3(b)(1), unless the authorization is terminated or revoked sooner. Performed at [facility], [address] [place], [place] [address]   MRSA PCR Screening     Status: None   Collection Time: 09/25/2018  8:38 PM   Specimen: Nasal Mucosa; Nasopharyngeal  Result Value Ref Range Status   MRSA by PCR NEGATIVE NEGATIVE Final    Comment:        The GeneXpert MRSA Assay (FDA approved for NASAL specimens only), is one component of a comprehensive MRSA colonization surveillance program. It is not intended to diagnose MRSA infection nor to guide or monitor treatment for MRSA infections. Performed at [facility], [address] [place], [place] [address]   Culture, blood (Routine X 2) w Reflex to ID Panel     Status: None   Collection Time: 09/05/18  6:52 AM   Specimen: BLOOD  Result Value Ref Range Status   Specimen Description BLOOD L HAND  Final   Special Requests   Final    BOTTLES DRAWN AEROBIC AND ANAEROBIC Blood Culture  adequate volume   Culture    Final    NO GROWTH 5 DAYS Performed at [facility], [address] [place], [place] [address]    Report Status 09/06/2018 FINAL  Final  Culture, blood (Routine X 2) w Reflex to ID Panel     Status: None   Collection Time: 09/05/18  7:04 AM   Specimen: BLOOD  Result Value Ref Range Status   Specimen Description BLOOD L FOREARM  Final   Special Requests   Final    BOTTLES DRAWN AEROBIC AND ANAEROBIC Blood Culture adequate volume   Culture   Final    NO GROWTH 5 DAYS Performed at [facility], [address], [place], [place] [address]    Report Status 09/06/2018 FINAL  Final  Culture, respiratory (non-expectorated)     Status: None   Collection Time: 09/05/18 11:42 AM   Specimen: Tracheal Aspirate; Respiratory  Result Value Ref Range Status   Specimen Description TRACHEAL ASPIRATE  Final   Special Requests NONE  Final   Gram Stain   Final    FEW WBC PRESENT,BOTH PMN AND MONONUCLEAR RARE YEAST RARE GRAM POSITIVE COCCI NO SQUAMOUS EPITHELIAL CELLS PRESENT    Culture RARE CANDIDA ALBICANS FEW STAPHYLOCOCCUS AUREUS   Final   Report Status 09/09/2018 FINAL  Final   Organism ID, Bacteria STAPHYLOCOCCUS AUREUS  Final      Susceptibility   Staphylococcus aureus - MIC*    CIPROFLOXACIN >=8 RESISTANT Resistant     ERYTHROMYCIN >=8 RESISTANT Resistant     GENTAMICIN <=0.5 SENSITIVE Sensitive     OXACILLIN 0.5 SENSITIVE Sensitive     TETRACYCLINE <=1 SENSITIVE Sensitive     VANCOMYCIN <=0.5 SENSITIVE Sensitive     TRIMETH/SULFA <=10 SENSITIVE Sensitive     CLINDAMYCIN <=0.25 SENSITIVE Sensitive     RIFAMPIN <=0.5 SENSITIVE Sensitive     Inducible Clindamycin NEGATIVE Sensitive     * FEW STAPHYLOCOCCUS AUREUS    Coagulation Studies: Recent Labs    09/09/18 1310 08/31/2018 0622 09/11/18 0343  LABPROT 23.1* 21.2* 21.3*  INR 2.1* 1.9* 1.9*    Urinalysis: No results for input(s): COLORURINE, LABSPEC, PHURINE, GLUCOSEU, HGBUR, BILIRUBINUR, KETONESUR,  PROTEINUR, UROBILINOGEN, NITRITE, LEUKOCYTESUR in the last 168 hours.  Invalid input(s): APPERANCEUR  Lipid Panel:     Component Value Date/Time   CHOL 233 (H) 10/17/2015 1129   TRIG 130.0 10/17/2015 1129   HDL 79.10 10/17/2015 1129   CHOLHDL 3 10/17/2015 1129   VLDL 26.0 10/17/2015 1129   LDLCALC 128 (H) 10/17/2015 1129    HgbA1C: No results found for: HGBA1C  Urine Drug Screen:  No results found for: LABOPIA, COCAINSCRNUR, LABBENZ, AMPHETMU, THCU, LABBARB  Alcohol Level: No results for input(s): ETH in the last 168 hours.  Other results: EKG: sinus rhythm at 93 bpm with prolonged QT interval.  Imaging: Dg Abd 1 View  Result Date: 09/16/2018 CLINICAL DATA:  OG tube placement EXAM: ABDOMEN - 1 VIEW COMPARISON:  None. FINDINGS: OG tube appears adequately position in the stomach. Visualized bowel gas pattern is nonobstructive. IMPRESSION: OG tube appears adequately position in the stomach. Electronically Signed   By: Franki Cabot M.D.   On: 09/09/2018 18:55   Ct Head Wo Contrast  Result Date: 09/11/2018 CLINICAL DATA:  Altered level of consciousness, unexplained EXAM: CT HEAD WITHOUT CONTRAST TECHNIQUE: Contiguous axial images were obtained from the base of the skull through the vertex without intravenous contrast. COMPARISON:  12/17/2009 FINDINGS: Brain: No evidence of acute infarction,  hemorrhage, hydrocephalus, extra-axial collection or mass lesion/mass effect. Mild cerebral volume loss. Vascular: No hyperdense vessel or unexpected calcification. Skull: Normal. Negative for fracture or focal lesion. Heterogeneous bony density at the central skull base that is unchanged from prior and considered benign. Sinuses/Orbits: No acute finding. IMPRESSION: Stable head CT.  No acute finding. Electronically Signed   By: Monte Fantasia M.D.   On: 09/11/2018 11:21   Dg Chest Port 1 View  Result Date: 09/12/2018 CLINICAL DATA:  Respiratory failure EXAM: PORTABLE CHEST 1 VIEW COMPARISON:   Yesterday FINDINGS: Endotracheal tube tip is 2 cm above the carina. The orogastric tube at least reaches the stomach. PICC with tip at the SVC. Extensive bilateral airspace disease is unchanged. No pneumothorax. Stable cardiac enlargement IMPRESSION: No significant change in hardware positioning, low lung volumes, and extensive airspace disease. Electronically Signed   By: Monte Fantasia M.D.   On: 09/12/2018 07:48   Dg Chest Port 1 View  Result Date: 09/11/2018 CLINICAL DATA:  Respiratory failure EXAM: PORTABLE CHEST 1 VIEW COMPARISON:  Three days ago FINDINGS: Endotracheal tube tip is ~2 cm above the carina. Right PICC with tip at the SVC. The orogastric tube at least reaches the stomach. Artifact from EKG leads. Extensive bilateral airspace disease. Cardiomegaly. Possible layering pleural effusions. No pneumothorax. IMPRESSION: 1. New hardware is in good position. 2. Extensive bilateral airspace disease likely a combination of edema and pneumonia. Electronically Signed   By: Monte Fantasia M.D.   On: 09/11/2018 07:23     Assessment/Plan: 53 year old female with a history of alcoholic cirrhosis presenting in in hypovolemic shock due to GIB. Now intubated due to aspiration PNA leading to respiratory distress.  Patient has remained unresponsive.  Last doses of Fentanyl on 8/14.  Last doses of Versed on 8/15.  Along with multiorgan injury patient also with hyperammonemia (serum ammonia 120) and hyperbilirubinemia (serum bili 13).  Receiving thiamine.  Suspect mental status related to a severe metabolic encephalopathy.  Head CT reviewed and shows no acute changes.  Prognosis appears poor, at this time not likely from a CNS perspective but from her other system failures.  Will rule out other possible contributors.    Recommendations: 1. EEG 2. Based on results of EEG will determine need for further imaging.  3. Agree with limiting sedation since her clearance is likely poor.     Alexis Goodell,  MD Neurology [phone] 09/12/2018, 10:16 AM

## 2018-09-12 NOTE — Progress Notes (Signed)
[place] [facility] [place] NAME: Mackenzie Little    MR#:  [id number]  DATE OF BIRTH:  March 01, 1965  SUBJECTIVE:  Patient remains ventilated.  Seen by neurology this am  EEG pending  REVIEW OF SYSTEMS:    ROS  On mechanical ventilator  DRUG ALLERGIES:  No Known Allergies  VITALS:  Blood pressure (!) 142/83, pulse 98, temperature 98.3 F (36.8 C), temperature source Axillary, resp. rate (!) 27, height 5\' 6"  (1.676 m), weight 103.1 kg, SpO2 93 %.  PHYSICAL EXAMINATION:   Physical Exam  GENERAL:  53 y.o.-year-old patient lying in the bed on ventilator EYES: Pupils 3 mm barely reactive.  HEENT: Head atraumatic, normocephalic. Oropharynx endotracheal tube noted NECK:  Supple, no jugular venous distention. No thyroid enlargement, no tenderness.  LUNGS: no rhonchii this am  cARDIOVASCULAR: S1, S2 normal. No murmurs, rubs, or gallops.  ABDOMEN: Soft, nontender, nondistended. Bowel sounds present. No organomegaly or mass.  EXTREMITIES: No cyanosis, clubbing or edema b/l.    NEUROLOGIC: Patient with downward gaze.  Pupils are 3 mm sluggish.  Does not respond to deep sternal rub.  Patient is intubated.   PSYCHIATRIC: The patient is sedated on vent SKIN: No obvious rash, lesion, or ulcer.   LABORATORY PANEL:   CBC Recent Labs  Lab 09/12/18 0504  WBC 27.7*  HGB 8.0*  HCT 23.6*  PLT 73*   ------------------------------------------------------------------------------------------------------------------ Chemistries  Recent Labs  Lab 09/11/18 0343 09/12/18 0504  NA 143 143  K 3.0* 2.8*  CL 116* 117*  CO2 17* 18*  GLUCOSE 136* 84  BUN 61* 67*  CREATININE UNABLE TO REPORT DUE TO ICTERIC INTERFERENCE. UNABLE TO REPORT DUE TO ICTERUS  CALCIUM 7.9* 8.4*  MG 1.9  --   AST 70* 98*  ALT UNABLE TO REPORT DUE TO ICTERIC INTERFERENCE UNABLE TO REPORT DUE TO ICTERUS  ALKPHOS 54 86  BILITOT 21.4* 26.2*    ------------------------------------------------------------------------------------------------------------------  Cardiac Enzymes No results for input(s): TROPONINI in the last 168 hours. ------------------------------------------------------------------------------------------------------------------  RADIOLOGY:  Dg Abd 1 View  Result Date: 09/07/2018 CLINICAL DATA:  OG tube placement EXAM: ABDOMEN - 1 VIEW COMPARISON:  None. FINDINGS: OG tube appears adequately position in the stomach. Visualized bowel gas pattern is nonobstructive. IMPRESSION: OG tube appears adequately position in the stomach. Electronically Signed   By: Franki Cabot M.D.   On: 08/30/2018 18:55   Ct Head Wo Contrast  Result Date: 09/11/2018 CLINICAL DATA:  Altered level of consciousness, unexplained EXAM: CT HEAD WITHOUT CONTRAST TECHNIQUE: Contiguous axial images were obtained from the base of the skull through the vertex without intravenous contrast. COMPARISON:  12/17/2009 FINDINGS: Brain: No evidence of acute infarction, hemorrhage, hydrocephalus, extra-axial collection or mass lesion/mass effect. Mild cerebral volume loss. Vascular: No hyperdense vessel or unexpected calcification. Skull: Normal. Negative for fracture or focal lesion. Heterogeneous bony density at the central skull base that is unchanged from prior and considered benign. Sinuses/Orbits: No acute finding. IMPRESSION: Stable head CT.  No acute finding. Electronically Signed   By: Monte Fantasia M.D.   On: 09/11/2018 11:21   Dg Chest Port 1 View  Result Date: 09/12/2018 CLINICAL DATA:  Respiratory failure EXAM: PORTABLE CHEST 1 VIEW COMPARISON:  Yesterday FINDINGS: Endotracheal tube tip is 2 cm above the carina. The orogastric tube at least reaches the stomach. PICC with tip at the SVC. Extensive bilateral airspace disease is unchanged. No pneumothorax. Stable cardiac enlargement IMPRESSION: No significant change in hardware positioning, low lung  volumes, and extensive airspace disease. Electronically Signed   By: Monte Fantasia M.D.   On: 09/12/2018 07:48   Dg Chest Port 1 View  Result Date: 09/11/2018 CLINICAL DATA:  Respiratory failure EXAM: PORTABLE CHEST 1 VIEW COMPARISON:  Three days ago FINDINGS: Endotracheal tube tip is ~2 cm above the carina. Right PICC with tip at the SVC. The orogastric tube at least reaches the stomach. Artifact from EKG leads. Extensive bilateral airspace disease. Cardiomegaly. Possible layering pleural effusions. No pneumothorax. IMPRESSION: 1. New hardware is in good position. 2. Extensive bilateral airspace disease likely a combination of edema and pneumonia. Electronically Signed   By: Monte Fantasia M.D.   On: 09/11/2018 07:23     ASSESSMENT AND PLAN:   53 year old female patientwith a known history ofhypertension, alcohol abuse presented to the emergency room for generalized weakness.Patient has been fatigued for the last 3 days and noticed dark tarry stool for the last couple of days.   Patient was tired and fatigued and had labored breathing this morning and has been electively intubated and put on ventilator by intensivist.  -Hypovolemic shock and multiorgan failure -due to active GI bleed -  Initial labs with evidence of acute blood loss (Hgb 6.5), and elevated INR, elevated troponins, and evidence of AKI (Cr 2.47, GFR 25) - on vasopressors to maintain MAP >65. Status post 2U PRBC and FFP    -Acute respiratory failure with hypoxia Mechanical ventilator as per intensivist.   -Multilobar pneumonia Continue Unasyn  -Acute gastrointestinal bleeding with acute blood loss anemia EGD shows with LA grade C esophagitis with no bleeding and severe portal hypertensive gastropathy.  No endoscopic evidence of varices.  1 nonbleeding cratered duodenal ulcer with no stigmata of bleeding. Recommendation for IV PPI She is status post 2 unit PRBC   -Abnormal liver function tests Due to hypovolemic  shock   -Acute kidney injury:  Due to ATN from hypovolemic shock Avoid nephrotoxic medications.  -Elevated troponins : Due to demand ischemia from hypovolemic shock  -Acute hypokalemia: Replete PRN   Overall prognosis very poor.      CODE STATUS: Full code  DVT Prophylaxis: SCDs  TOTAL TIME TAKING CARE OF THIS PATIENT: 25 minutes.   POSSIBLE D/C IN?? , DEPENDING ON CLINICAL CONDITION.  [name] M.D on 09/12/2018 at 11:10 AM  Between 7am to 6pm - Pager - [phone]  After 6pm go to [email or web address] - password [email or web address] [facility]  Office  [phone]  CC: Primary care physician; Ranae Plumber, PA  Note: This dictation was prepared with Dragon dictation along with smaller phrase technology. Any transcriptional errors that result from this process are unintentional.

## 2018-09-13 ENCOUNTER — Encounter: Payer: Self-pay | Admitting: Internal Medicine

## 2018-09-13 ENCOUNTER — Inpatient Hospital Stay: Payer: Medicaid Other

## 2018-09-13 ENCOUNTER — Other Ambulatory Visit: Payer: Self-pay

## 2018-09-13 DIAGNOSIS — K7201 Acute and subacute hepatic failure with coma: Secondary | ICD-10-CM

## 2018-09-13 DIAGNOSIS — R4182 Altered mental status, unspecified: Secondary | ICD-10-CM

## 2018-09-13 LAB — URINALYSIS, COMPLETE (UACMP) WITH MICROSCOPIC
RBC / HPF: >50 RBC/hpf — ABNORMAL HIGH (ref 0–5)
Specific Gravity, Urine: 1.018 (ref 1.005–1.030)
WBC, UA: >50 WBC/hpf — ABNORMAL HIGH (ref 0–5)

## 2018-09-13 LAB — HEPATIC FUNCTION PANEL
ALT: UNDETERMINED U/L (ref 0–44)
AST: 102 U/L — ABNORMAL HIGH (ref 15–41)
Albumin: 2.1 g/dL — ABNORMAL LOW (ref 3.5–5.0)
Alkaline Phosphatase: 126 U/L (ref 38–126)
Bilirubin, Direct: 15.7 mg/dL — ABNORMAL HIGH (ref 0.0–0.2)
Indirect Bilirubin: 12.7 mg/dL — ABNORMAL HIGH (ref 0.3–0.9)
Total Bilirubin: 28.4 mg/dL (ref 0.3–1.2)
Total Protein: 7.3 g/dL (ref 6.5–8.1)

## 2018-09-13 LAB — BASIC METABOLIC PANEL
Anion gap: 16 — ABNORMAL HIGH (ref 5–15)
BUN: 63 mg/dL — ABNORMAL HIGH (ref 6–20)
CO2: 20 mmol/L — ABNORMAL LOW (ref 22–32)
Calcium: 8.6 mg/dL — ABNORMAL LOW (ref 8.9–10.3)
Chloride: 111 mmol/L (ref 98–111)
Creatinine, Ser: UNDETERMINED mg/dL (ref 0.44–1.00)
Glucose, Bld: 122 mg/dL — ABNORMAL HIGH (ref 70–99)
Potassium: 2.9 mmol/L — ABNORMAL LOW (ref 3.5–5.1)
Sodium: 147 mmol/L — ABNORMAL HIGH (ref 135–145)

## 2018-09-13 LAB — CBC
HCT: 22.6 % — ABNORMAL LOW (ref 36.0–46.0)
Hemoglobin: 7.7 g/dL — ABNORMAL LOW (ref 12.0–15.0)
MCH: 27.1 pg (ref 26.0–34.0)
MCHC: 34.1 g/dL (ref 30.0–36.0)
MCV: 79.6 fL — ABNORMAL LOW (ref 80.0–100.0)
Platelets: 100 10*3/uL — ABNORMAL LOW (ref 150–400)
RBC: 2.84 MIL/uL — ABNORMAL LOW (ref 3.87–5.11)
RDW: 19.8 % — ABNORMAL HIGH (ref 11.5–15.5)
WBC: 30.3 10*3/uL — ABNORMAL HIGH (ref 4.0–10.5)
nRBC: 1.1 % — ABNORMAL HIGH (ref 0.0–0.2)

## 2018-09-13 LAB — GLUCOSE, CAPILLARY
Glucose-Capillary: 112 mg/dL — ABNORMAL HIGH (ref 70–99)
Glucose-Capillary: 118 mg/dL — ABNORMAL HIGH (ref 70–99)
Glucose-Capillary: 129 mg/dL — ABNORMAL HIGH (ref 70–99)
Glucose-Capillary: 155 mg/dL — ABNORMAL HIGH (ref 70–99)
Glucose-Capillary: 155 mg/dL — ABNORMAL HIGH (ref 70–99)

## 2018-09-13 LAB — AMMONIA
Ammonia: 83 umol/L — ABNORMAL HIGH (ref 9–35)
Ammonia: 98 umol/L — ABNORMAL HIGH (ref 9–35)

## 2018-09-13 LAB — MAGNESIUM: Magnesium: 1.6 mg/dL — ABNORMAL LOW (ref 1.7–2.4)

## 2018-09-13 LAB — PROTIME-INR
INR: 2.1 — ABNORMAL HIGH (ref 0.8–1.2)
Prothrombin Time: 23 seconds — ABNORMAL HIGH (ref 11.4–15.2)

## 2018-09-13 LAB — PROCALCITONIN: Procalcitonin: 2.25 ng/mL

## 2018-09-13 LAB — PHOSPHORUS: Phosphorus: 1.6 mg/dL — ABNORMAL LOW (ref 2.5–4.6)

## 2018-09-13 MED ORDER — VANCOMYCIN HCL 10 G IV SOLR
2000.0000 mg | Freq: Once | INTRAVENOUS | Status: AC
  Administered 2018-09-13: 2000 mg via INTRAVENOUS
  Filled 2018-09-13: qty 2000

## 2018-09-13 MED ORDER — POTASSIUM PHOSPHATES 15 MMOLE/5ML IV SOLN
30.0000 mmol | Freq: Once | INTRAVENOUS | Status: AC
  Administered 2018-09-13: 30 mmol via INTRAVENOUS
  Filled 2018-09-13: qty 10

## 2018-09-13 MED ORDER — SODIUM CHLORIDE 0.9 % IV SOLN
2.0000 g | INTRAVENOUS | Status: DC
  Administered 2018-09-13 – 2018-09-14 (×2): 2 g via INTRAVENOUS
  Filled 2018-09-13 (×3): qty 2

## 2018-09-13 MED ORDER — ADULT MULTIVITAMIN LIQUID CH
15.0000 mL | Freq: Every day | ORAL | Status: DC
  Administered 2018-09-13 – 2018-09-18 (×6): 15 mL
  Filled 2018-09-13 (×6): qty 15

## 2018-09-13 MED ORDER — VITAL AF 1.2 CAL PO LIQD
1000.0000 mL | ORAL | Status: DC
  Administered 2018-09-14 – 2018-09-17 (×3): 1000 mL

## 2018-09-13 MED ORDER — MAGNESIUM SULFATE 4 GM/100ML IV SOLN
4.0000 g | Freq: Once | INTRAVENOUS | Status: AC
  Administered 2018-09-13: 4 g via INTRAVENOUS
  Filled 2018-09-13: qty 100

## 2018-09-13 MED ORDER — VITAMIN B-1 100 MG PO TABS
100.0000 mg | ORAL_TABLET | Freq: Every day | ORAL | Status: DC
  Administered 2018-09-14 – 2018-09-18 (×5): 100 mg
  Filled 2018-09-13 (×5): qty 1

## 2018-09-13 MED ORDER — SODIUM BICARBONATE 650 MG PO TABS
1300.0000 mg | ORAL_TABLET | Freq: Two times a day (BID) | ORAL | Status: DC
  Administered 2018-09-13 – 2018-09-18 (×10): 1300 mg
  Filled 2018-09-13 (×11): qty 2

## 2018-09-13 MED ORDER — POTASSIUM CHLORIDE 20 MEQ/15ML (10%) PO SOLN
40.0000 meq | Freq: Three times a day (TID) | ORAL | Status: DC
  Administered 2018-09-13: 40 meq
  Filled 2018-09-13 (×3): qty 30

## 2018-09-13 MED ORDER — VANCOMYCIN VARIABLE DOSE PER UNSTABLE RENAL FUNCTION (PHARMACIST DOSING): Status: DC

## 2018-09-13 MED ORDER — DEXTROSE 5 % IV SOLN
INTRAVENOUS | Status: DC
  Administered 2018-09-13 – 2018-09-14 (×2): via INTRAVENOUS

## 2018-09-13 MED ORDER — POTASSIUM CHLORIDE 20 MEQ/15ML (10%) PO SOLN
20.0000 meq | Freq: Once | ORAL | Status: AC
  Administered 2018-09-13: 20 meq
  Filled 2018-09-13: qty 15

## 2018-09-13 NOTE — Progress Notes (Signed)
PULMONARY/CCM PROGRESS NOTE  PT PROFILE: 53 y.o. F adm 08/05 with hypovolemic shock due to melena, weakness with a history of alcoholic cirrhosis.  Other problems on admission include AKI, lactic acidosis, elevated troponin.  MAJOR EVENTS/TEST RESULTS: 08/05 gastroenterology consultation for GI bleeding 08/05 cardiology consultation for elevated troponin 08/06 abdominal ultrasound: No ascites 08/08 pelvic ultrasound: Normal 08/08 intubated for respiratory distress, suspected aspiration 08/11 echocardiogram: LVEF 50-55%.  LV cavity mildly dilated.  Moderate LVH.  Doppler parameters consistent with diastolic dysfunction.  LA moderately dilated. 08/13 palliative care consultation 08/14 EGD: Distal esophagitis without active bleeding.  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  No endoscopic evidence of varices in the entire examined stomach. 08/15 CT head: normal 08/16 Neurology consultation: Suspect severe TME. Prognosis deemed to likely be poor 08/17 Remains comatose off of all sedation 08/17 EEG:   INDWELLING DEVICES: R femoral CVL 08/05 >> 08/13 ETT 08/08 >>  RUE PICC 08/13 >>   MICRO DATA: SARS-CoV-2 PCR 8/5 >>negative MRSA PCR 8/5>> negative Resp 8/9 >> MSSA Blood 8/5 >> negative Blood 8/9 >>negative Urine 08/17 >>  Resp 08/17 >>  Blood 08/17 >>  PCT 08/17: 2.25,    ANTIMICROBIALS:  Vancomycin 8/5 >> 8/6 Cefepime 8/5 >> 8/11 Metronidazole 8/6 >> 8/11 Ampicillin-sulbactam 8/11 >> 08/15  Vancomycin 08/17 >>  Cefepime 08/17 >>   SUBJ: Remains comatose.  Synchronous with ventilator, tolerating PSV mode at 10 cm H2O. Ve 16 LPM.  Low-grade fever.  Leukocytosis   OBJ: Vitals:   09/13/18 0732 09/13/18 0800 09/13/18 1100 09/13/18 1206  BP:  136/65    Pulse:  (!) 102 (!) 104   Resp:  (!) 30 (!) 27   Temp:  (!) 100.7 F (38.2 C)    TempSrc:  Oral    SpO2: 95% 91% 96% 96%  Weight:      Height:       Vent Mode: PSV FiO2 (%):  [28 %] 28  % Set Rate:  [16 bmp] 16 bmp Vt Set:  [500 mL] 500 mL PEEP:  [5 cmH20] 5 cmH20 Pressure Support:  [10 cmH20] 10 cmH20 Plateau Pressure:  [20 cmH20-22 cmH20] 20 cmH20   Gen: Comatose, intubated, synchronous with ventilator HEENT: NCAT, severe sclericterus Neck: No JVD noted Lungs: Clear anteriorly, no wheezes Cardiovascular: Regular, no M Abdomen: Distended, soft, active BS Ext: Bilateral symmetric UEs and LE edema, extremities warm Neuro: Minimal spontaneous movement.  Tends to have downward gaze. Pupils react. Skin: No lesions noted  BMP Latest Ref Rng & Units 09/13/2018 09/12/2018 09/11/2018  Glucose 70 - 99 mg/dL 122(H) 84 136(H)  BUN 6 - 20 mg/dL 63(H) 67(H) 61(H)  Creatinine 0.44 - 1.00 mg/dL UNABLE TO REPORT DUE TO ICTERUS UNABLE TO REPORT DUE TO ICTERUS UNABLE TO REPORT DUE TO ICTERIC INTERFERENCE.  Sodium 135 - 145 mmol/L 147(H) 143 143  Potassium 3.5 - 5.1 mmol/L 2.9(L) 2.8(L) 3.0(L)  Chloride 98 - 111 mmol/L 111 117(H) 116(H)  CO2 22 - 32 mmol/L 20(L) 18(L) 17(L)  Calcium 8.9 - 10.3 mg/dL 8.6(L) 8.4(L) 7.9(L)    Hepatic Function Latest Ref Rng & Units 09/13/2018 09/12/2018 09/11/2018  Total Protein 6.5 - 8.1 g/dL 7.3 7.1 7.2  Albumin 3.5 - 5.0 g/dL 2.1(L) 2.1(L) 2.2(L)  AST 15 - 41 U/L 102(H) 98(H) 70(H)  ALT 0 - 44 U/L UNABLE TO REPORT DUE TO ICTERUS UNABLE TO REPORT DUE TO ICTERUS UNABLE TO REPORT DUE TO ICTERIC INTERFERENCE  Alk Phosphatase 38 - 126 U/L  126 86 54  Total Bilirubin 0.3 - 1.2 mg/dL 28.4(HH) 26.2(HH) 21.4(HH)  Bilirubin, Direct 0.0 - 0.2 mg/dL 15.7(H) - 13.0(H)    CBC Latest Ref Rng & Units 09/13/2018 09/12/2018 09/11/2018  WBC 4.0 - 10.5 K/uL 30.3(H) 27.7(H) 20.9(H)  Hemoglobin 12.0 - 15.0 g/dL 7.7(L) 8.0(L) 7.9(L)  Hematocrit 36.0 - 46.0 % 22.6(L) 23.6(L) 24.4(L)  Platelets 150 - 400 K/uL 100(L) 73(L) 62(L)    ABG    Component Value Date/Time   PHART 7.24 (L) 09/04/2018 1150   PCO2ART 44 09/04/2018 1150   PO2ART 97 09/04/2018 1150   HCO3 18.9 (L)  09/04/2018 1150   ACIDBASEDEF 8.3 (H) 09/04/2018 1150   O2SAT 96.2 09/04/2018 1150   Ammonia 120, 83  CXR: Bilateral airspace disease with confluence in RLL   IMPRESSION: Prolonged ventilator dependent respiratory failure Aspiration pneumonia - now fully treated Hemorrhagic shock, resolved AKI, nonoliguric.  Cr cannot be tracked due to very high bilirubin Metabolic acidosis, now mild Persistent hypokalemia Severe alcoholic cirrhosis Acute hepatic failure UGI bleed due to portal gastropathy.  Bleeding appears to be resolved Acute blood loss anemia - no overt bleeding presently Moderate thrombocytopenia Coagulopathy due to liver failure Low-grade fever, leukocytosis, mildly elevated PCT Severe acute encephalopathy.  Likely hepatic encephalopathy Severe hyperammonemia  PLAN/REC: Cont vent support - settings reviewed and/or adjusted Continue PSV mode as tolerated Cont vent bundle Daily SBT if/when meets criteria ICU hemodynamic monitoring.  MAP goal > 65 mmHg Monitor BMET intermittently Monitor I/Os Correct electrolytes as indicated Cont enteral bicarbonate K+ repleted 08/17 Continue TF protocol SUP: Enteral pantoprazole Monitor LFTs intermittently Monitor temp, WBC count Micro and abx as above DVT px: SCDs Monitor CBC intermittently Transfuse per usual guidelines Monitor coags intermittently Continue daily vitamin K - initiated 8/15 Continue lactulose and rifaximin - initiated 08/14  I have requested to meet with family 8/18 to discuss further goals of care  CCM time: 40 mins The above time includes time spent in consultation with patient and/or family members and reviewing care plan on multidisciplinary rounds  Merton Border, MD PCCM service Mobile [phone] Pager [phone] 09/13/2018 12:46 PM

## 2018-09-13 NOTE — Progress Notes (Signed)
eeg completed ° °

## 2018-09-13 NOTE — Progress Notes (Signed)
Pharmacy Antibiotic Note  Mackenzie Little is a 53 y.o. female admitted on 09/22/2018. Patient initially started on ceftriaxone, cefepime, metronidazole, and vancomycin for sepsis. Patient intubated, requiring pressors, and on multiple infusions secondary to GI bleed. Unasyn was given for suspected aspiration pneumonia and stopped this past weekend. Patient is now being given cefepime and vancomycin for HCAP. Pharmacy has been consulted for cefepime and vancomycin dosing.   Plan: Initiate cefepime 2g Q12H (based on renal function from 8/15) and vancomycin 2g x 1 dose (dosing with levels until Scr returns). Will obtain random level at 08/18 1100. Patient will get another dose when level returns <20.  Height: 5\' 6"  (167.6 cm) Weight: 228 lb 6.3 oz (103.6 kg) IBW/kg (Calculated) : 59.3  Temp (24hrs), Avg:99.1 F (37.3 C), Min:98 F (36.7 C), Max:100.7 F (38.2 C)  Recent Labs  Lab 09/07/18 1131 09/08/18 0405 09/09/18 0322 09/22/2018 0326 09/11/18 0343 09/12/18 0504 09/13/18 0358  WBC  --  35.2* 27.6* 16.9* 20.9* 27.7* 30.3*  CREATININE  --  1.92* 2.72* 3.02* UNABLE TO REPORT DUE TO ICTERIC INTERFERENCE. UNABLE TO REPORT DUE TO ICTERUS UNABLE TO REPORT DUE TO ICTERUS  LATICACIDVEN 2.1* 1.8  --   --   --   --   --     CrCl cannot be calculated (This lab value cannot be used to calculate CrCl because it is not a number: UNABLE TO REPORT DUE TO ICTERUS).    CrCl on 8/15: 29.7 mL/min (Scr: 2.68 mg/dL)  No Known Allergies  Antimicrobials this admission: Ceftriaxone 08/05 x 1 Cefepime 08/05 >> 08/11 Vancomycin 08/05 x 1  Metronidazole 08/06 >> 08/11 Unasyn 08/11 >> 08/15  Cefepime 08/17 >> Vancomycin 08/17 >>  Dose adjustments this admission: 08/13 Unasyn changed from [place] to Q12H 08/15 Unasyn changed from Q12H to [facility]  Microbiology results: 08/05 MRSA PCR: negative 08/05 BCx: no growth  08/09 BCx: no growth 08/09 Respiratory Cx: rare candida albicans; few staph aureus (sensitive  to oxacillin) 08/17 Urine Cx: pending 08/17 Resp Cx: pending 08/17 BCx: pending  Thank you for allowing pharmacy to be a part of this patient's care.  Haniel Fix [name] 09/13/2018 4:37 PM

## 2018-09-13 NOTE — Progress Notes (Signed)
[facility] [place] NAME: Mackenzie Little    MR#:  [id number]  DATE OF BIRTH:  [birth year]  SUBJECTIVE:  Patient still on vent EEG pending  REVIEW OF SYSTEMS:    ROS  On mechanical ventilator  DRUG ALLERGIES:  No Known Allergies  VITALS:  Blood pressure 136/65, pulse (!) 104, temperature (!) 100.7 F (38.2 C), temperature source Oral, resp. rate (!) 27, height 5\' 6"  (1.676 m), weight 103.6 kg, SpO2 96 %.  PHYSICAL EXAMINATION:   Physical Exam  GENERAL:  53 y.o.-year-old patient lying in the bed on ventilator EYES: Pupils 3 mm barely reactive. Scleral icteric HEENT: Head atraumatic, normocephalic. Oropharynx endotracheal tube noted NECK:  Supple, no jugular venous distention. No thyroid enlargement, no tenderness.  LUNGS: no rhonchii this am  cARDIOVASCULAR: S1, S2 normal. No murmurs, rubs, or gallops.  ABDOMEN: Soft, nontender, nondistended. Bowel sounds present. No organomegaly or mass.  EXTREMITIES: No cyanosis, clubbing or edema b/l.    NEUROLOGIC: Patient with downward gaze.  Pupils are 3 mm sluggish.  Does not respond to deep sternal rub.  Patient is intubated.   PSYCHIATRIC: The patient is sedated on vent SKIN: No obvious rash, lesion, or ulcer.  JAUNDICE LABORATORY PANEL:   CBC Recent Labs  Lab 09/13/18 0358  WBC 30.3*  HGB 7.7*  HCT 22.6*  PLT 100*   ------------------------------------------------------------------------------------------------------------------ Chemistries  Recent Labs  Lab 09/11/18 0343  09/13/18 0358  NA 143   < > 147*  K 3.0*   < > 2.9*  CL 116*   < > 111  CO2 17*   < > 20*  GLUCOSE 136*   < > 122*  BUN 61*   < > 63*  CREATININE UNABLE TO REPORT DUE TO ICTERIC INTERFERENCE.   < > UNABLE TO REPORT DUE TO ICTERUS  CALCIUM 7.9*   < > 8.6*  MG 1.9  --   --   AST 70*   < > 102*  ALT UNABLE TO REPORT DUE TO ICTERIC INTERFERENCE   < > UNABLE TO REPORT DUE TO ICTERUS  ALKPHOS 54   < > 126   BILITOT 21.4*   < > 28.4*   < > = values in this interval not displayed.   ------------------------------------------------------------------------------------------------------------------  Cardiac Enzymes No results for input(s): TROPONINI in the last 168 hours. ------------------------------------------------------------------------------------------------------------------  RADIOLOGY:  Dg Chest Port 1 View  Result Date: 09/13/2018 CLINICAL DATA:  Respiratory failure EXAM: PORTABLE CHEST 1 VIEW COMPARISON:  09/12/2018 FINDINGS: Grossly unchanged borderline enlarged cardiac silhouette and mediastinal contours with persistent obscuration of right heart border secondary to heterogeneous airspace opacities. Stable positioning of support apparatus. No definite pneumothorax. Bilateral heterogeneous airspace opacities are unchanged. No new focal airspace opacities. No definite pleural effusion. No evidence of edema. No acute osseous abnormalities. IMPRESSION: 1.  Stable positioning of support apparatus.  No pneumothorax. 2. Grossly unchanged bibasilar heterogeneous airspace opacities, right greater than left, worrisome for multifocal infection, including atypical etiologies. Electronically Signed   By: Sandi Mariscal M.D.   On: 09/13/2018 07:53   Dg Chest Port 1 View  Result Date: 09/12/2018 CLINICAL DATA:  Respiratory failure EXAM: PORTABLE CHEST 1 VIEW COMPARISON:  Yesterday FINDINGS: Endotracheal tube tip is 2 cm above the carina. The orogastric tube at least reaches the stomach. PICC with tip at the SVC. Extensive bilateral airspace disease is unchanged. No pneumothorax. Stable cardiac enlargement IMPRESSION: No significant change in hardware positioning, low lung volumes, and extensive airspace  disease. Electronically Signed   By: Monte Fantasia M.D.   On: 09/12/2018 07:48     ASSESSMENT AND PLAN:   53 year old female patientwith a known history ofhypertension, alcohol abuse presented to the  emergency room for generalized weakness.Patient has been fatigued for the last 3 days and noticed dark tarry stool for the last couple of days.   Patient was tired and fatigued and had labored breathing this morning and has been electively intubated and put on ventilator by intensivist.  -Hypovolemic shock and multiorgan failure -due to active GI bleed -  Initial labs with evidence of acute blood loss (Hgb 6.5), and elevated INR, elevated troponins, and evidence of AKI (Cr 2.47, GFR 25) - on vasopressors to maintain MAP >65. Status post 2U PRBC and FFP    -Acute respiratory failure with hypoxia Mechanical ventilator as per intensivist.   -Multilobar pneumonia Continue Unasyn  -Acute gastrointestinal bleeding with acute blood loss anemia EGD shows with LA grade C esophagitis with no bleeding and severe portal hypertensive gastropathy.  No endoscopic evidence of varices.  1 nonbleeding cratered duodenal ulcer with no stigmata of bleeding. Recommendation for IV PPI She is status post 2 unit PRBC   -Abnormal liver function tests Due to hypovolemic shock   -Acute kidney injury:  Due to ATN from hypovolemic shock Avoid nephrotoxic medications.  -Elevated troponins : Due to demand ischemia from hypovolemic shock  -Acute hypokalemia: Replete PRN   Overall prognosis very poor.   Should be comfort care    CODE STATUS: Full code  DVT Prophylaxis: SCDs  TOTAL TIME TAKING CARE OF THIS PATIENT: 25 minutes.   POSSIBLE D/C IN?? , DEPENDING ON CLINICAL CONDITION.  [name] M.D on 09/13/2018 at 11:48 AM  Between 7am to 6pm - Pager - [phone]  After 6pm go to [email or web address] - password [email or web address] [facility]  Office  [phone]  CC: Primary care physician; Ranae Plumber, PA  Note: This dictation was prepared with Dragon dictation along with smaller phrase technology. Any transcriptional errors that result from this process are unintentional.

## 2018-09-13 NOTE — Procedures (Signed)
Patient Name: Mackenzie Little  MRN: [id number]  Epilepsy Attending: Lora Havens  Referring Physician/Provider: Dr Merton Border Date: 09/13/2018 Duration: 25.38 mins  Patient history: 53yo F with ams. EEG to evaluate for seizure.  Level of alertness: stupor  Technical aspects: This EEG study was done with scalp electrodes positioned according to the 10-20 International system of electrode placement. Electrical activity was acquired at a sampling rate of 500Hz  and reviewed with a high frequency filter of 70Hz  and a low frequency filter of 1Hz . EEG data were recorded continuously and digitally stored.   DESCRIPTION: EEG showed continuous rhythmic 2-3Hz  delta slowing as well as triphasic waves, maximal bifrontal. EEG was reactive to noxious stimuli. Hyperventilation and photic stimulation were not performed.  IMPRESSION: This study is suggestive of severe diffuse encephalopathy, likely secondary to metabolic etiology. No seizures or definite epileptiform discharges were seen throughout the recording.  Deann Mclaine Barbra Sarks

## 2018-09-13 NOTE — Progress Notes (Signed)
Daily Progress Note   Patient Name: Mackenzie Little       Date: 09/13/2018 DOB: Oct 22, 1965  Age: 53 y.o. MRN#: [id number] Attending Physician: [name], MD Primary Care Physician: Ranae Plumber, [place] Admit Date: 09/04/2018  Reason for Consultation/Follow-up: Establishing goals of care  Subjective: Patient is resting in bed on ventilator. No family at bedside. Called to speak with husband. He states he has been kept updated by staff/CCM. He states he understands that is wife is not progressing, and her prognosis is very poor. He states he will speak with his children/family members tonight. He will meet with me tomorrow at 1:00.     Length of Stay: 12  Current Medications: Scheduled Meds:  . chlorhexidine gluconate (MEDLINE KIT)  15 mL Mouth Rinse BID  . Chlorhexidine Gluconate Cloth  6 each Topical QHS  . free water  200 mL Per Tube Q4H  . lactulose  30 g Per Tube TID  . mouth rinse  15 mL Mouth Rinse 10 times per day  . multivitamin  15 mL Per Tube Daily  . pantoprazole sodium  40 mg Per Tube Q1200  . phytonadione  10 mg Per Tube Daily  . potassium chloride  40 mEq Per Tube TID  . rifaximin  550 mg Per Tube BID  . sodium bicarbonate  1,300 mg Oral BID  . sodium chloride flush  10-40 mL Intracatheter Q12H  . thiamine injection  100 mg Intravenous Daily    Continuous Infusions: . sodium chloride Stopped (09/11/18 0206)  . ceFEPime (MAXIPIME) IV Stopped (09/13/18 1122)  . dextrose 50 mL/hr at 09/13/18 1200  . feeding supplement (VITAL AF 1.2 CAL) 55 mL/hr at 09/13/18 0700    PRN Meds: sodium chloride, metoprolol tartrate, [DISCONTINUED] ondansetron **OR** ondansetron (ZOFRAN) IV, sodium chloride flush  Physical Exam Pulmonary:     Comments: On vent Neurological:     Mental  Status: She is alert.     Comments: No sedation. Not alert.              Vital Signs: BP 136/65 (BP Location: Left Arm)   Pulse (!) 104   Temp (!) 100.7 F (38.2 C) (Oral)   Resp (!) 27   Ht '5\' 6"'  (1.676 m)   Wt 103.6 kg   SpO2 96%   BMI  36.86 kg/m  SpO2: SpO2: 96 % O2 Device: O2 Device: Ventilator O2 Flow Rate: O2 Flow Rate (L/min): (S) 15 L/min  Intake/output summary:   Intake/Output Summary (Last 24 hours) at 09/13/2018 1302 Last data filed at 09/13/2018 1200 Gross per 24 hour  Intake 1915.54 ml  Output 3650 ml  Net -1734.46 ml   LBM: Last BM Date: 09/13/18 Baseline Weight: Weight: 77.1 kg Most recent weight: Weight: 103.6 kg       Palliative Assessment/Data:      Patient Active Problem List   Diagnosis Date Noted  . Cirrhosis of liver ([place])   . Lactic acidosis   . Liver failure without hepatic coma ([place])   . GI bleed 09/18/2018  . Chest pain 07/06/2017  . Well woman exam 10/17/2015  . Anemia 06/04/2015  . Generalized anxiety disorder 05/02/2015  . Elevated blood pressure (not hypertension) 08/21/2014  . Menopausal disorder 08/21/2014    Palliative Care Assessment & Plan    Recommendations/Plan:  Meeting with husband tomorrow at 1:00. He will speak with his family tonight.    Code Status:    Code Status Orders  (From admission, onward)         Start     Ordered   09/09/2018 2026  Full code  Continuous     09/25/2018 2025        Code Status History    Date Active Date Inactive Code Status Order ID Comments User Context   07/06/2017 0709 07/08/2017 1928 Full Code [id number]  Arta Silence, MD ED   Advance Care Planning Activity       Prognosis:  Very poor.   Discharge Planning:  To Be Determined  Care plan was discussed with RN  Thank you for allowing the Palliative Medicine Team to assist in the care of this patient.   Total Time 25 min Prolonged Time Billed  no      Greater than 50%  of this time was spent counseling  and coordinating care related to the above assessment and plan.  Asencion Gowda, NP  Please contact Palliative Medicine Team phone at [phone] for questions and concerns.

## 2018-09-14 ENCOUNTER — Inpatient Hospital Stay: Payer: Medicaid Other

## 2018-09-14 DIAGNOSIS — Z7189 Other specified counseling: Secondary | ICD-10-CM

## 2018-09-14 LAB — GLUCOSE, CAPILLARY
Glucose-Capillary: 110 mg/dL — ABNORMAL HIGH (ref 70–99)
Glucose-Capillary: 113 mg/dL — ABNORMAL HIGH (ref 70–99)
Glucose-Capillary: 122 mg/dL — ABNORMAL HIGH (ref 70–99)
Glucose-Capillary: 124 mg/dL — ABNORMAL HIGH (ref 70–99)
Glucose-Capillary: 128 mg/dL — ABNORMAL HIGH (ref 70–99)
Glucose-Capillary: 140 mg/dL — ABNORMAL HIGH (ref 70–99)
Glucose-Capillary: 97 mg/dL (ref 70–99)

## 2018-09-14 LAB — CBC
HCT: 19.7 % — ABNORMAL LOW (ref 36.0–46.0)
Hemoglobin: 6.6 g/dL — ABNORMAL LOW (ref 12.0–15.0)
MCH: 26.9 pg (ref 26.0–34.0)
MCHC: 33.5 g/dL (ref 30.0–36.0)
MCV: 80.4 fL (ref 80.0–100.0)
Platelets: 107 10*3/uL — ABNORMAL LOW (ref 150–400)
RBC: 2.45 MIL/uL — ABNORMAL LOW (ref 3.87–5.11)
RDW: 20.1 % — ABNORMAL HIGH (ref 11.5–15.5)
WBC: 30.9 10*3/uL — ABNORMAL HIGH (ref 4.0–10.5)
nRBC: 1.8 % — ABNORMAL HIGH (ref 0.0–0.2)

## 2018-09-14 LAB — BASIC METABOLIC PANEL
Anion gap: 6 (ref 5–15)
BUN: 50 mg/dL — ABNORMAL HIGH (ref 6–20)
CO2: 19 mmol/L — ABNORMAL LOW (ref 22–32)
Calcium: 7.9 mg/dL — ABNORMAL LOW (ref 8.9–10.3)
Chloride: 117 mmol/L — ABNORMAL HIGH (ref 98–111)
Creatinine, Ser: UNDETERMINED mg/dL (ref 0.44–1.00)
Glucose, Bld: 342 mg/dL — ABNORMAL HIGH (ref 70–99)
Potassium: 3 mmol/L — ABNORMAL LOW (ref 3.5–5.1)
Sodium: 142 mmol/L (ref 135–145)

## 2018-09-14 LAB — HEPATIC FUNCTION PANEL
ALT: UNDETERMINED U/L (ref 0–44)
AST: 124 U/L — ABNORMAL HIGH (ref 15–41)
Albumin: 1.8 g/dL — ABNORMAL LOW (ref 3.5–5.0)
Alkaline Phosphatase: 126 U/L (ref 38–126)
Bilirubin, Direct: 15 mg/dL — ABNORMAL HIGH (ref 0.0–0.2)
Indirect Bilirubin: 11.5 mg/dL — ABNORMAL HIGH (ref 0.3–0.9)
Total Bilirubin: 26.5 mg/dL (ref 0.3–1.2)
Total Protein: 6.9 g/dL (ref 6.5–8.1)

## 2018-09-14 LAB — BLOOD GAS, ARTERIAL
Acid-base deficit: 1.5 mmol/L (ref 0.0–2.0)
Bicarbonate: 21.1 mmol/L (ref 20.0–28.0)
FIO2: 0.28
MECHVT: 500 mL
O2 Saturation: 97.1 %
PEEP: 5 cmH2O
Patient temperature: 37
RATE: 16 resp/min
pCO2 arterial: 29 mmHg — ABNORMAL LOW (ref 32.0–48.0)
pH, Arterial: 7.47 — ABNORMAL HIGH (ref 7.350–7.450)
pO2, Arterial: 86 mmHg (ref 83.0–108.0)

## 2018-09-14 LAB — PROCALCITONIN: Procalcitonin: 1.81 ng/mL

## 2018-09-14 LAB — URINE CULTURE: Culture: >=100000 — AB

## 2018-09-14 LAB — AMMONIA: Ammonia: 71 umol/L — ABNORMAL HIGH (ref 9–35)

## 2018-09-14 LAB — PROTIME-INR
INR: 2.2 — ABNORMAL HIGH (ref 0.8–1.2)
Prothrombin Time: 24.4 seconds — ABNORMAL HIGH (ref 11.4–15.2)

## 2018-09-14 LAB — MAGNESIUM: Magnesium: 1.9 mg/dL (ref 1.7–2.4)

## 2018-09-14 LAB — PHOSPHORUS: Phosphorus: UNDETERMINED mg/dL (ref 2.5–4.6)

## 2018-09-14 MED ORDER — SODIUM CHLORIDE 0.9 % IV SOLN
2.0000 g | Freq: Two times a day (BID) | INTRAVENOUS | Status: DC
  Administered 2018-09-14 – 2018-09-15 (×3): 2 g via INTRAVENOUS
  Filled 2018-09-14 (×5): qty 2

## 2018-09-14 MED ORDER — FLUCONAZOLE IN SODIUM CHLORIDE 200-0.9 MG/100ML-% IV SOLN
200.0000 mg | Freq: Every day | INTRAVENOUS | Status: DC
  Administered 2018-09-14 – 2018-09-18 (×4): 200 mg via INTRAVENOUS
  Filled 2018-09-14 (×6): qty 100

## 2018-09-14 MED ORDER — POTASSIUM CHLORIDE 20 MEQ/15ML (10%) PO SOLN
40.0000 meq | Freq: Two times a day (BID) | ORAL | Status: AC
  Administered 2018-09-14 (×2): 40 meq
  Filled 2018-09-14 (×2): qty 30

## 2018-09-14 NOTE — Consult Note (Signed)
Subjective: Pt is off sedation but still doesn't follow commands. She is overbreathing the ventilator.    Past Medical History:  Diagnosis Date  . Hypertension     Past Surgical History:  Procedure Laterality Date  . COLONOSCOPY N/A 07/07/2017   Procedure: COLONOSCOPY;  Surgeon: Lin Landsman, MD;  Location: [facility] ENDOSCOPY;  Service: Gastroenterology;  Laterality: N/A;  . ESOPHAGOGASTRODUODENOSCOPY N/A 07/07/2017   Procedure: ESOPHAGOGASTRODUODENOSCOPY (EGD);  Surgeon: Lin Landsman, MD;  Location: [facility] ENDOSCOPY;  Service: Gastroenterology;  Laterality: N/A;  . ESOPHAGOGASTRODUODENOSCOPY N/A 09/14/2018   Procedure: ESOPHAGOGASTRODUODENOSCOPY (EGD);  Surgeon: Toledo, Benay Pike, MD;  Location: [facility] ENDOSCOPY;  Service: Gastroenterology;  Laterality: N/A;  . TOOTH EXTRACTION  May 2016    Family history: Patient unable to provide due to mental status  Social History:  reports that she has quit smoking. She has never used smokeless tobacco. She reports current alcohol use. She reports that she does not use drugs.  No Known Allergies  Medications:  I have reviewed the patient's current medications. Prior to Admission:  Medications Prior to Admission  Medication Sig Dispense Refill Last Dose  . acetaminophen (TYLENOL) 325 MG tablet Take 650 mg by mouth every 6 (six) hours as needed.   08/31/2018 at Unknown time   Scheduled: . chlorhexidine gluconate (MEDLINE KIT)  15 mL Mouth Rinse BID  . Chlorhexidine Gluconate Cloth  6 each Topical QHS  . free water  200 mL Per Tube Q4H  . lactulose  30 g Per Tube TID  . mouth rinse  15 mL Mouth Rinse 10 times per day  . multivitamin  15 mL Per Tube Daily  . pantoprazole sodium  40 mg Per Tube Q1200  . phytonadione  10 mg Per Tube Daily  . rifaximin  550 mg Per Tube BID  . sodium bicarbonate  1,300 mg Per Tube BID  . sodium chloride flush  10-40 mL Intracatheter Q12H  . thiamine  100 mg Per Tube Daily    ROS: Patient unable to  provide due to mental status  Physical Examination: Blood pressure 129/60, pulse (!) 101, temperature 99 F (37.2 C), temperature source Oral, resp. rate (!) 29, height '5\' 6"'  (1.676 m), weight 97.4 kg, SpO2 92 %.  HEENT-  Normocephalic, no lesions, without obvious abnormality.  Jaundiced.  Normal TM's bilaterally.  Normal auditory canals and external ears. Normal external nose, mucus membranes and septum.  Normal pharynx. Cardiovascular- S1, S2 normal, pulses palpable throughout   Lungs- chest clear, no wheezing, rales, normal symmetric air entry Abdomen- soft, non-tender; bowel sounds normal; no masses,  no organomegaly Extremities- mild LE edema Lymph-no adenopathy palpable Musculoskeletal-no joint tenderness, deformity or swelling Skin-warm and dry, no hyperpigmentation, vitiligo, or suspicious lesions  Neurological Examination   Mental Status: Patient does not respond to verbal stimuli.  Does not respond to deep sternal rub.  Does not follow commands.  No verbalizations noted.  Cranial Nerves: II: Blinks to bilateral confrontation bilaterally, pupils right 3 mm, left 3 mm,and reactive bilaterally III,IV,VI: Oculocephalic response present bilaterally.  Downward gaze at rest.  V,VII: corneal reflex reduced bilaterally but present  VIII: patient does not respond to verbal stimuli IX,X: gag reflex unable to test, XI: trapezius strength unable to test bilaterally XII: tongue strength unable to test Motor: Extremities flaccid throughout.  No spontaneous movement noted.  No purposeful movements noted. Sensory: Does not respond to noxious stimuli in any extremity. Deep Tendon Reflexes:  Absent throughout. Plantars: Mute bilaterally Cerebellar: Unable to perform  Laboratory Studies:   Basic Metabolic Panel: Recent Labs  Lab 09/09/18 0322 09/04/2018 0326 09/11/18 0343 09/12/18 0504 09/13/18 0358 09/13/18 1115 09/14/18 0423  NA 137 139 143 143 147*  --  142  K 3.9 3.7 3.0* 2.8*  2.9*  --  3.0*  CL 116* 117* 116* 117* 111  --  117*  CO2 13* 12* 17* 18* 20*  --  19*  GLUCOSE 126* 113* 136* 84 122*  --  342*  BUN 51* 55* 61* 67* 63*  --  50*  CREATININE 2.72* 3.02* UNABLE TO REPORT DUE TO ICTERIC INTERFERENCE. UNABLE TO REPORT DUE TO ICTERUS UNABLE TO REPORT DUE TO ICTERUS  --  UNABLE TO REPORT DUE TO ICTERUS  CALCIUM 7.0* 7.6* 7.9* 8.4* 8.6*  --  7.9*  MG 2.1 1.9 1.9  --   --  1.6* 1.9  PHOS 5.1* 5.2* 4.7*  --   --  1.6* UNABLE TO REPORT DUE TO ICTERUS    Liver Function Tests: Recent Labs  Lab 09/25/2018 0326 09/11/18 0343 09/12/18 0504 09/13/18 0358 09/14/18 0423  AST 65* 70* 98* 102* 124*  ALT 27 UNABLE TO REPORT DUE TO ICTERIC INTERFERENCE UNABLE TO REPORT DUE TO ICTERUS UNABLE TO REPORT DUE TO ICTERUS UNABLE TO REPORT DUE TO ICTERUS  ALKPHOS 51 54 86 126 126  BILITOT 16.7* 21.4* 26.2* 28.4* 26.5*  PROT 6.6 7.2 7.1 7.3 6.9  ALBUMIN 2.1* 2.2* 2.1* 2.1* 1.8*   No results for input(s): LIPASE, AMYLASE in the last 168 hours. Recent Labs  Lab 09/13/18 0358 09/13/18 1013 09/14/18 0515  AMMONIA 83* 98* 71*    CBC: Recent Labs  Lab 09/27/2018 0326  08/31/2018 2018 09/11/18 0343 09/12/18 0504 09/13/18 0358 09/14/18 0423  WBC 16.9*  --   --  20.9* 27.7* 30.3* 30.9*  NEUTROABS 14.3*  --   --   --   --   --   --   HGB 7.0*   < > 8.1* 7.9* 8.0* 7.7* 6.6*  HCT 22.0*   < > 24.6* 24.4* 23.6* 22.6* 19.7*  MCV 83.7  --   --  85.0 81.7 79.6* 80.4  PLT 71*  --   --  62* 73* 100* 107*   < > = values in this interval not displayed.    Cardiac Enzymes: No results for input(s): CKTOTAL, CKMB, CKMBINDEX, TROPONINI in the last 168 hours.  BNP: Invalid input(s): POCBNP  CBG: Recent Labs  Lab 09/13/18 1943 09/14/18 0009 09/14/18 0405 09/14/18 0727 09/14/18 1127  GLUCAP 155* 128* 140* 40* 122*    Microbiology: Results for orders placed or performed during the hospital encounter of 09/13/2018  Culture, blood (routine x 2)     Status: None   Collection  Time: 09/13/2018  1:25 PM   Specimen: BLOOD  Result Value Ref Range Status   Specimen Description BLOOD LEFT ANTECUBITAL  Final   Special Requests   Final    BOTTLES DRAWN AEROBIC AND ANAEROBIC Blood Culture adequate volume   Culture   Final    NO GROWTH 5 DAYS Performed at [facility], [address] [place], [place] [address]    Report Status 09/06/2018 FINAL  Final  Culture, blood (routine x 2)     Status: None   Collection Time: 09/05/2018  1:39 PM   Specimen: BLOOD  Result Value Ref Range Status   Specimen Description BLOOD RIGHT ANTECUBITAL  Final   Special Requests   Final    BOTTLES DRAWN AEROBIC  AND ANAEROBIC Blood Culture results may not be optimal due to an excessive volume of blood received in culture bottles   Culture   Final    NO GROWTH 5 DAYS Performed at [facility], [address] [place], [place] [address]    Report Status 09/06/2018 FINAL  Final  SARS Coronavirus 2 [facility] order, Performed in [facility] hospital lab)     Status: None   Collection Time: 09/08/2018  4:22 PM  Result Value Ref Range Status   SARS Coronavirus 2 NEGATIVE NEGATIVE Final    Comment: (NOTE) SARS CoV 2 target nucleic acids are NOT DETECTED. The SARS CoV 2 RNA is generally detectable in upper and lower respiratory specimens during the acute phase of infection. The lowest concentration of SARS CoV 2 viral copies this assay can detect is 250 copies per mL. A negative result does not preclude SARS CoV 2 infection and should not be used as the sole basis for treatment or other patient management decisions. A negative result may occur with improper specimen collection and handling, submission  of specimen other than nasopharyngeal swab, presence of viral mutation(s) within the areas targeted by this assay, and inadequate number of viral copies (less than 250 copies per mL). A negative result must be combined with clinical observations, patient history,  and  epidemiological information. The expected result is Negative. Fact Sheet for Patients:   https [employer] 235361 download Fact Sheet for Healthcare Providers:   https [employer] media 6158748884 d Jenel Lucks This test is not yet approved or cleared by the [place] FDA and  has been authorized for detection and/or diagnosis of SARS CoV 2 by FDA under an Emergency Use Authorization (EUA).  This EUA will remain in effect (meaning this test can be used) for the duration of  the COVID19 declaration under Section 564(b)(1) of the Act, 21 U.S.C.  section 360bbb-3(b)(1), unless the authorization is terminated or revoked sooner. Performed at [facility], [address] [place], [place] [address]   MRSA PCR Screening     Status: None   Collection Time: 09/27/2018  8:38 PM   Specimen: Nasal Mucosa; Nasopharyngeal  Result Value Ref Range Status   MRSA by PCR NEGATIVE NEGATIVE Final    Comment:        The GeneXpert MRSA Assay (FDA approved for NASAL specimens only), is one component of a comprehensive MRSA colonization surveillance program. It is not intended to diagnose MRSA infection nor to guide or monitor treatment for MRSA infections. Performed at [facility], [address] [place], [place] [address]   Culture, blood (Routine X 2) w Reflex to ID Panel     Status: None   Collection Time: 09/05/18  6:52 AM   Specimen: BLOOD  Result Value Ref Range Status   Specimen Description BLOOD L HAND  Final   Special Requests   Final    BOTTLES DRAWN AEROBIC AND ANAEROBIC Blood Culture adequate volume   Culture   Final    NO GROWTH 5 DAYS Performed at [facility], [address] [place],  [address]    Report Status 08/30/2018 FINAL  Final  Culture, blood (Routine X 2) w Reflex to ID Panel     Status: None   Collection Time: 09/05/18  7:04 AM   Specimen: BLOOD  Result Value Ref Range Status   Specimen Description BLOOD L FOREARM  Final    Special Requests   Final    BOTTLES DRAWN AEROBIC  AND ANAEROBIC Blood Culture adequate volume   Culture   Final    NO GROWTH 5 DAYS Performed at [facility], [address] [place], [place] [address]    Report Status 09/24/2018 FINAL  Final  Culture, respiratory (non-expectorated)     Status: None   Collection Time: 09/05/18 11:42 AM   Specimen: Tracheal Aspirate; Respiratory  Result Value Ref Range Status   Specimen Description TRACHEAL ASPIRATE  Final   Special Requests NONE  Final   Gram Stain   Final    FEW WBC PRESENT,BOTH PMN AND MONONUCLEAR RARE YEAST RARE GRAM POSITIVE COCCI NO SQUAMOUS EPITHELIAL CELLS PRESENT    Culture RARE CANDIDA ALBICANS FEW STAPHYLOCOCCUS AUREUS   Final   Report Status 09/09/2018 FINAL  Final   Organism ID, Bacteria STAPHYLOCOCCUS AUREUS  Final      Susceptibility   Staphylococcus aureus - MIC*    CIPROFLOXACIN >=8 RESISTANT Resistant     ERYTHROMYCIN >=8 RESISTANT Resistant     GENTAMICIN <=0.5 SENSITIVE Sensitive     OXACILLIN 0.5 SENSITIVE Sensitive     TETRACYCLINE <=1 SENSITIVE Sensitive     VANCOMYCIN <=0.5 SENSITIVE Sensitive     TRIMETH/SULFA <=10 SENSITIVE Sensitive     CLINDAMYCIN <=0.25 SENSITIVE Sensitive     RIFAMPIN <=0.5 SENSITIVE Sensitive     Inducible Clindamycin NEGATIVE Sensitive     * FEW STAPHYLOCOCCUS AUREUS  Urine Culture     Status: Abnormal   Collection Time: 09/13/18  9:57 AM   Specimen: Urine, Catheterized  Result Value Ref Range Status   Specimen Description   Final    URINE, CATHETERIZED Performed at [facility], [address], [place], [place] [address]    Special Requests   Final    NONE Performed at [facility], [address] [place], [place] [address]    Culture >=100,000 COLONIES/mL YEAST (A)  Final   Report Status 09/14/2018 FINAL  Final  Culture, blood (Routine X 2) w Reflex to ID Panel     Status: None (Preliminary result)   Collection Time: 09/13/18 10:11 AM    Specimen: BLOOD  Result Value Ref Range Status   Specimen Description BLOOD LEFT HAND  Final   Special Requests   Final    BOTTLES DRAWN AEROBIC AND ANAEROBIC Blood Culture adequate volume   Culture   Final    NO GROWTH < 24 HOURS Performed at [facility], [address] [place], [place] [address]    Report Status PENDING  Incomplete  Culture, blood (Routine X 2) w Reflex to ID Panel     Status: None (Preliminary result)   Collection Time: 09/13/18 11:15 AM   Specimen: BLOOD  Result Value Ref Range Status   Specimen Description BLOOD BLOOD LEFT HAND  Final   Special Requests   Final    BOTTLES DRAWN AEROBIC AND ANAEROBIC Blood Culture adequate volume   Culture   Final    NO GROWTH < 24 HOURS Performed at [facility], [address] [place], [place] [address]    Report Status PENDING  Incomplete  Culture, respiratory     Status: None (Preliminary result)   Collection Time: 09/13/18 12:05 PM   Specimen: Tracheal Aspirate; Respiratory  Result Value Ref Range Status   Specimen Description   Final    TRACHEAL ASPIRATE Performed at [facility], [address], [place], [place] [address]    Special Requests   Final    NONE Performed at  [place], [place] [address]    Gram Stain   Final    MODERATE WBC PRESENT,BOTH PMN AND MONONUCLEAR RARE YEAST    Culture   Final    NO GROWTH < 12 HOURS Performed at [facility] [address] [place], [place] [address]    Report Status PENDING  Incomplete    Coagulation Studies: Recent Labs    09/13/18 0358 09/14/18 0423  LABPROT 23.0* 24.4*  INR 2.1* 2.2*    Urinalysis:  Recent Labs  Lab 09/13/18 0957  COLORURINE AMBER*  LABSPEC 1.018  PHURINE TEST NOT REPORTED DUE TO COLOR INTERFERENCE OF URINE PIGMENT  GLUCOSEU TEST NOT REPORTED DUE TO COLOR INTERFERENCE OF URINE PIGMENT*  HGBUR TEST NOT REPORTED DUE TO COLOR INTERFERENCE OF URINE PIGMENT*   BILIRUBINUR TEST NOT REPORTED DUE TO COLOR INTERFERENCE OF URINE PIGMENT*  KETONESUR TEST NOT REPORTED DUE TO COLOR INTERFERENCE OF URINE PIGMENT*  PROTEINUR TEST NOT REPORTED DUE TO COLOR INTERFERENCE OF URINE PIGMENT*  NITRITE TEST NOT REPORTED DUE TO COLOR INTERFERENCE OF URINE PIGMENT*  LEUKOCYTESUR TEST NOT REPORTED DUE TO COLOR INTERFERENCE OF URINE PIGMENT*    Lipid Panel:     Component Value Date/Time   CHOL 233 (H) 10/17/2015 1129   TRIG 130.0 10/17/2015 1129   HDL 79.10 10/17/2015 1129   CHOLHDL 3 10/17/2015 1129   VLDL 26.0 10/17/2015 1129   LDLCALC 128 (H) 10/17/2015 1129    HgbA1C: No results found for: HGBA1C  Urine Drug Screen:  No results found for: LABOPIA, COCAINSCRNUR, LABBENZ, AMPHETMU, THCU, LABBARB  Alcohol Level: No results for input(s): ETH in the last 168 hours.  Other results: EKG: sinus rhythm at 93 bpm with prolonged QT interval.  Imaging: Dg Chest Port 1 View  Result Date: 09/14/2018 CLINICAL DATA:  Respiratory failure EXAM: PORTABLE CHEST 1 VIEW COMPARISON:  09/13/2018 FINDINGS: Cardiac shadow is enlarged but stable. Endotracheal tube is again seen at the level of the carina and could be withdrawn 1-2 cm. Right-sided PICC line and gastric catheter are seen. The lungs are well aerated with patchy infiltrates right greater than left stable from the prior exam. No new focal abnormality is noted. IMPRESSION: No change from the prior exam. The endotracheal tube is seen at the level of the carina and could be withdrawn 1-2 cm. Electronically Signed   By: Inez Catalina M.D.   On: 09/14/2018 07:07   Dg Chest Port 1 View  Result Date: 09/13/2018 CLINICAL DATA:  Respiratory failure EXAM: PORTABLE CHEST 1 VIEW COMPARISON:  09/12/2018 FINDINGS: Grossly unchanged borderline enlarged cardiac silhouette and mediastinal contours with persistent obscuration of right heart border secondary to heterogeneous airspace opacities. Stable positioning of support apparatus. No  definite pneumothorax. Bilateral heterogeneous airspace opacities are unchanged. No new focal airspace opacities. No definite pleural effusion. No evidence of edema. No acute osseous abnormalities. IMPRESSION: 1.  Stable positioning of support apparatus.  No pneumothorax. 2. Grossly unchanged bibasilar heterogeneous airspace opacities, right greater than left, worrisome for multifocal infection, including atypical etiologies. Electronically Signed   By: Sandi Mariscal M.D.   On: 09/13/2018 07:53     Assessment/Plan: 53 year old female with a history of alcoholic cirrhosis presenting in in hypovolemic shock due to GIB. Intubated due to aspiration PNA leading to respiratory distress.  Patient has remained unresponsive.  Off sedation  - Multiple medical problems - EEG with diffuse slowing and metabolic encephalopathy - agree with palliative care discussion as likely  poor prognosis given the multiple medical problems.

## 2018-09-14 NOTE — Progress Notes (Signed)
[place] [facility] [place] NAME: Mackenzie Little    MR#:  [id number]  DATE OF BIRTH:  05-Oct-1965  SUBJECTIVE:  Palliative care to meet with family today Patient remains critically ill on mechanical ventilator Poor prognosis Hemoglobin low this morning  REVIEW OF SYSTEMS:    ROS  On mechanical ventilator  DRUG ALLERGIES:  No Known Allergies  VITALS:  Blood pressure 129/60, pulse (!) 101, temperature 99 F (37.2 C), temperature source Oral, resp. rate (!) 29, height 5\' 6"  (1.676 m), weight 97.4 kg, SpO2 92 %.  PHYSICAL EXAMINATION:   Physical Exam  GENERAL:  53 y.o.-year-old patient lying in the bed on ventilator EYES: Pupils 3 mm barely reactive. Scleral icteric HEENT: Head atraumatic, normocephalic. Oropharynx endotracheal tube noted NECK:  Supple, no jugular venous distention. No thyroid enlargement, no tenderness.  LUNGS: no rhonchii this am  cARDIOVASCULAR: S1, S2 normal. No murmurs, rubs, or gallops.  ABDOMEN: Soft, nontender, nondistended. Bowel sounds present. No organomegaly or mass.  EXTREMITIES: No cyanosis, clubbing or edema b/l.    NEUROLOGIC: Patient with downward gaze.  Pupils are 3 mm sluggish.  Does not respond to deep sternal rub.  Patient is intubated.   PSYCHIATRIC: The patient is sedated on vent SKIN: No obvious rash, lesion, or ulcer.  [facility] LABORATORY PANEL:   CBC Recent Labs  Lab 09/14/18 0423  WBC 30.9*  HGB 6.6*  HCT 19.7*  PLT 107*   ------------------------------------------------------------------------------------------------------------------ Chemistries  Recent Labs  Lab 09/14/18 0423  NA 142  K 3.0*  CL 117*  CO2 19*  GLUCOSE 342*  BUN 50*  CREATININE UNABLE TO REPORT DUE TO ICTERUS  CALCIUM 7.9*  MG 1.9  AST 124*  ALT UNABLE TO REPORT DUE TO ICTERUS  ALKPHOS 126  BILITOT 26.5*    ------------------------------------------------------------------------------------------------------------------  Cardiac Enzymes No results for input(s): TROPONINI in the last 168 hours. ------------------------------------------------------------------------------------------------------------------  RADIOLOGY:  Dg Chest Port 1 View  Result Date: 09/14/2018 CLINICAL DATA:  Respiratory failure EXAM: PORTABLE CHEST 1 VIEW COMPARISON:  09/13/2018 FINDINGS: Cardiac shadow is enlarged but stable. Endotracheal tube is again seen at the level of the carina and could be withdrawn 1-2 cm. Right-sided PICC line and gastric catheter are seen. The lungs are well aerated with patchy infiltrates right greater than left stable from the prior exam. No new focal abnormality is noted. IMPRESSION: No change from the prior exam. The endotracheal tube is seen at the level of the carina and could be withdrawn 1-2 cm. Electronically Signed   By: Mackenzie Little M.D.   On: 09/14/2018 07:07   Dg Chest Port 1 View  Result Date: 09/13/2018 CLINICAL DATA:  Respiratory failure EXAM: PORTABLE CHEST 1 VIEW COMPARISON:  09/12/2018 FINDINGS: Grossly unchanged borderline enlarged cardiac silhouette and mediastinal contours with persistent obscuration of right heart border secondary to heterogeneous airspace opacities. Stable positioning of support apparatus. No definite pneumothorax. Bilateral heterogeneous airspace opacities are unchanged. No new focal airspace opacities. No definite pleural effusion. No evidence of edema. No acute osseous abnormalities. IMPRESSION: 1.  Stable positioning of support apparatus.  No pneumothorax. 2. Grossly unchanged bibasilar heterogeneous airspace opacities, right greater than left, worrisome for multifocal infection, including atypical etiologies. Electronically Signed   By: Mackenzie Little M.D.   On: 09/13/2018 07:53     ASSESSMENT AND PLAN:   53 year old female patientwith a known history  ofhypertension, alcohol abuse presented to the emergency room for generalized weakness.Patient has been fatigued for the last 3  days and noticed dark tarry stool for the last couple of days.   Patient was tired and fatigued and had labored breathing this morning and has been electively intubated and put on ventilator by intensivist.  -Hypovolemic shock and multiorgan failure -due to active GI bleed -  Initial labs with evidence of acute blood loss (Hgb 6.5), and elevated INR, elevated troponins, and evidence of AKI (Cr 2.47, GFR 25) - on vasopressors to maintain MAP >65. Status post 2U PRBC and FFP    -Acute respiratory failure with hypoxia Mechanical ventilator as per intensivist.   -Multilobar pneumonia Continue cefepime -Acute gastrointestinal bleeding with acute blood loss anemia EGD shows with LA grade C esophagitis with no bleeding and severe portal hypertensive gastropathy.  No endoscopic evidence of varices.  1 nonbleeding cratered duodenal ulcer with no stigmata of bleeding. Recommendation for IV PPI She is status post 2 unit PRBC Hemoglobin 6.6 today. May need transfusion depending on meeting with family.   -Abnormal liver function tests Due to hypovolemic shock   -Acute kidney injury:  Due to ATN from hypovolemic shock Avoid nephrotoxic medications.  -Elevated troponins : Due to demand ischemia from hypovolemic shock  -Acute hypokalemia: Replete PRN  -Acute metabolic/hepatic encephalopathy: severe diffuse encephalopathy, likely secondary to metabolic etiology. No seizures or definite epileptiform discharges were seen throughout the recording.  Nh3 level 71 Cont lactulose/rifaimin Cont thiamine  -Yeast UTI: on diflucan    Overall prognosis very poor.  Patient with multiorgan failure Would recommend comfort care    CODE STATUS: Full code  DVT Prophylaxis: SCDs  TOTAL TIME TAKING CARE OF THIS PATIENT: 27 minutes.   POSSIBLE D/C IN?? , DEPENDING ON  CLINICAL CONDITION.  Mackenzie Little M.D on 09/14/2018 at 11:54 AM  Between 7am to 6pm - Pager - [phone]  After 6pm go to [email or web address] - password [email or web address] [facility]  Office  [phone]  CC: Primary care physician; Mackenzie Plumber, PA  Note: This dictation was prepared with Dragon dictation along with smaller phrase technology. Any transcriptional errors that result from this process are unintentional.

## 2018-09-14 NOTE — Progress Notes (Signed)
PULMONARY/CCM PROGRESS NOTE  PT PROFILE: 53 y.o. F adm 08/05 with hypovolemic shock due to melena, weakness with a history of alcoholic cirrhosis.  Other problems on admission include AKI, lactic acidosis, elevated troponin.  MAJOR EVENTS/TEST RESULTS: 08/05 gastroenterology consultation for GI bleeding 08/05 cardiology consultation for elevated troponin 08/06 abdominal ultrasound: No ascites 08/08 pelvic ultrasound: Normal 08/08 intubated for respiratory distress, suspected aspiration 08/11 echocardiogram: LVEF 50-55%.  LV cavity mildly dilated.  Moderate LVH.  Doppler parameters consistent with diastolic dysfunction.  LA moderately dilated. 08/13 palliative care consultation 08/14 EGD: Distal esophagitis without active bleeding.  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  No endoscopic evidence of varices in the entire examined stomach. 08/15 CT head: normal 08/16 Neurology consultation: Suspect severe TME. Prognosis deemed to likely be poor 08/17 Remains comatose off of all sedation 08/17 EEG: severe diffuse encephalopathy, likely secondary to metabolic etiology. No seizures or definite epileptiform discharges   INDWELLING DEVICES: R femoral CVL 08/05 >> 08/13 ETT 08/08 >>  RUE PICC 08/13 >>   MICRO DATA: SARS-CoV-2 PCR 8/5 >>negative MRSA PCR 8/5>> negative Resp 8/9 >> MSSA Blood 8/5 >> negative Blood 8/9 >>negative Urine 08/17 >> greater than 100k colonies/mL yeast Resp 08/17 >>  Blood 08/17 >>  PCT 08/17: 2.25, 1.81   ANTIMICROBIALS:  Vancomycin 8/5 >> 8/6 Cefepime 8/5 >> 8/11 Metronidazole 8/6 >> 8/11 Ampicillin-sulbactam 8/11 >> 08/15  Vancomycin 08/17 >> 08/18 Cefepime 08/17 >>  Fluconazole 08/18 >>   SUBJ: Remains comatose.  Intubated.  Has not received any sedation for 4 days.    OBJ: Vitals:   09/14/18 0814 09/14/18 0900 09/14/18 1000 09/14/18 1200  BP:  129/60 131/66 130/64  Pulse:  (!) 101 (!) 101 98  Resp:  (!) 29 (!)  29 (!) 32  Temp: 99 F (37.2 C)     TempSrc: Oral     SpO2:  95% 94%   Weight:      Height:       Vent Mode: PRVC FiO2 (%):  [28 %] 28 % Set Rate:  [16 bmp] 16 bmp Vt Set:  [500 mL] 500 mL PEEP:  [5 cmH20] 5 cmH20 Pressure Support:  [10 cmH20] 10 cmH20 Plateau Pressure:  [17 cmH20-25 cmH20] 25 cmH20   Gen: Comatose, intubated, synchronous with ventilator HEENT: NCAT, severe scleral icterus Neck: No JVD noted Lungs: Few rhonchi, no wheezes Cardiovascular: Regular, no M Abdomen: liver edge approx 5 cm below RCM, + BS, soft Ext: Symmetric bilateral LE edema Neuro: No spontaneous movement, pupils 2-3 mm, oculocephalic intact, no withdrawal Skin: No lesions noted  BMP Latest Ref Rng & Units 09/14/2018 09/13/2018 09/12/2018  Glucose 70 - 99 mg/dL 342(H) 122(H) 84  BUN 6 - 20 mg/dL 50(H) 63(H) 67(H)  Creatinine 0.44 - 1.00 mg/dL UNABLE TO REPORT DUE TO ICTERUS UNABLE TO REPORT DUE TO ICTERUS UNABLE TO REPORT DUE TO ICTERUS  Sodium 135 - 145 mmol/L 142 147(H) 143  Potassium 3.5 - 5.1 mmol/L 3.0(L) 2.9(L) 2.8(L)  Chloride 98 - 111 mmol/L 117(H) 111 117(H)  CO2 22 - 32 mmol/L 19(L) 20(L) 18(L)  Calcium 8.9 - 10.3 mg/dL 7.9(L) 8.6(L) 8.4(L)    Hepatic Function Latest Ref Rng & Units 09/14/2018 09/13/2018 09/12/2018  Total Protein 6.5 - 8.1 g/dL 6.9 7.3 7.1  Albumin 3.5 - 5.0 g/dL 1.8(L) 2.1(L) 2.1(L)  AST 15 - 41 U/L 124(H) 102(H) 98(H)  ALT 0 - 44 U/L UNABLE TO REPORT DUE TO ICTERUS UNABLE TO REPORT DUE  TO ICTERUS UNABLE TO REPORT DUE TO ICTERUS  Alk Phosphatase 38 - 126 U/L 126 126 86  Total Bilirubin 0.3 - 1.2 mg/dL 26.5(HH) 28.4(HH) 26.2(HH)  Bilirubin, Direct 0.0 - 0.2 mg/dL 15.0(H) 15.7(H) -    CBC Latest Ref Rng & Units 09/14/2018 09/13/2018 09/12/2018  WBC 4.0 - 10.5 K/uL 30.9(H) 30.3(H) 27.7(H)  Hemoglobin 12.0 - 15.0 g/dL 6.6(L) 7.7(L) 8.0(L)  Hematocrit 36.0 - 46.0 % 19.7(L) 22.6(L) 23.6(L)  Platelets 150 - 400 K/uL 107(L) 100(L) 73(L)    ABG    Component Value  Date/Time   PHART 7.24 (L) 09/04/2018 1150   PCO2ART 44 09/04/2018 1150   PO2ART 97 09/04/2018 1150   HCO3 18.9 (L) 09/04/2018 1150   ACIDBASEDEF 8.3 (H) 09/04/2018 1150   O2SAT 96.2 09/04/2018 1150   Ammonia 120, 83, 71  CXR: [place] R>L infiltrates   IMPRESSION: Prolonged ventilator dependent respiratory failure Aspiration pneumonia - now fully treated Hemorrhagic shock, resolved AKI, nonoliguric.  Cr cannot be tracked due to very high bilirubin Metabolic acidosis, now mild Persistent hypokalemia Severe alcoholic cirrhosis Acute hepatic failure due to alcoholic hepatitis UGI bleed due to portal gastropathy Acute blood loss anemia - no overt bleeding presently Moderate thrombocytopenia Coagulopathy due to liver failure Low-grade fever, leukocytosis, mildly elevated PCT Pyuria, funguria Severe hepatic encephalopathy Severe hyperammonemia  PLAN/REC: Cont vent support - settings reviewed and/or adjusted Continue PSV mode as tolerated Cont vent bundle Daily SBT if/when meets criteria ICU hemodynamic monitoring.  MAP goal > 65 mmHg Monitor BMET intermittently Monitor I/Os Correct electrolytes as indicated Cont enteral bicarbonate K+ repleted 08/18 Continue TF protocol SUP: Enteral pantoprazole Monitor LFTs intermittently Monitor temp, WBC count Micro and abx as above DVT px: SCDs Monitor CBC intermittently Transfuse per usual guidelines Monitor coags intermittently Continue daily vitamin K - initiated 8/15 Continue lactulose and rifaximin - initiated 08/14 Continue to monitor neurologic status off of all sedative medications   Palliative Care met with husband and son. They want to continue full support for a couple more days. I spoke with them and indicated that there would be no benefit to performing ACLS in event of cardiac arrest.  DNR order placed.  CCM time: 35 mins The above time includes time spent in consultation with patient and/or family members and  reviewing care plan on multidisciplinary rounds  Merton Border, MD PCCM service Mobile [phone] Pager [phone] 09/14/2018 3:20 PM

## 2018-09-14 NOTE — Progress Notes (Signed)
Pharmacy Antibiotic Note  Mackenzie Little is a 53 y.o. female admitted on 09/15/2018. Patient initially started on ceftriaxone, cefepime, metronidazole, and vancomycin for sepsis. Patient intubated, requiring pressors, and on multiple infusions secondary to GI bleed. Unasyn was given for suspected aspiration pneumonia and stopped this past weekend. Patient was also given cefepime and vancomycin for HCAP. Vancomycin was discontinued this morning. Urine culture returned with yeast. Pharmacy has been consulted for cefepime and fluconazole dosing.   Plan: Use estimated CrCl on 8/15: 51 mL/min (Scr: 2.68 mg/dL). Continue cefepime 2g Q12H.  Initiate fluconazole 200 mg QD. (Dosing for both based on renal function from 8/15).   Height: 5\' 6"  (167.6 cm) Weight: 214 lb 11.7 oz (97.4 kg) IBW/kg (Calculated) : 59.3  Temp (24hrs), Avg:99.1 F (37.3 C), Min:98.4 F (36.9 C), Max:100.6 F (38.1 C)  Recent Labs  Lab 09/08/18 0405  09/04/2018 0326 09/11/18 0343 09/12/18 0504 09/13/18 0358 09/14/18 0423  WBC 35.2*   < > 16.9* 20.9* 27.7* 30.3* 30.9*  CREATININE 1.92*   < > 3.02* UNABLE TO REPORT DUE TO ICTERIC INTERFERENCE. UNABLE TO REPORT DUE TO ICTERUS UNABLE TO REPORT DUE TO ICTERUS UNABLE TO REPORT DUE TO ICTERUS  LATICACIDVEN 1.8  --   --   --   --   --   --    < > = values in this interval not displayed.    CrCl cannot be calculated (This lab value cannot be used to calculate CrCl because it is not a number: UNABLE TO REPORT DUE TO ICTERUS).    CrCl on 8/15: 29.7 mL/min (Scr: 2.68 mg/dL)  No Known Allergies  Antimicrobials this admission: Ceftriaxone 08/05 x 1 Cefepime 08/05 >> 08/11 Vancomycin 08/05 x 1  Metronidazole 08/06 >> 08/11 Unasyn 08/11 >> 08/15  Cefepime 08/17 >> Vancomycin 08/17 >> 08/18 Fluconazole 08/18 >>  Dose adjustments this admission: 08/13 Unasyn changed from [place] to Q12H 08/15 Unasyn changed from Q12H to Scnetx  Microbiology results: 08/05 MRSA PCR:  negative 08/05 BCx: no growth  08/09 BCx: no growth 08/09 Respiratory Cx: rare candida albicans; few staph aureus (sensitive to oxacillin) 08/17 Urine Cx: >100,000 colonies/mL yeast 08/17 Resp Cx: no growth <12 hrs 08/17 BCx: no growth <24 hrs  Thank you for allowing pharmacy to be a part of this patient's care.  Mackenzie Little [name] 09/14/2018 2:33 PM

## 2018-09-14 NOTE — Progress Notes (Signed)
Nutrition Follow-up  DOCUMENTATION CODES:   Not applicable  INTERVENTION:  Continue Vital AF 1.2 at 55 mL/hr (1320 mL goal daily volume). Provides 1584 kcal, 99 grams of protein, 1069 mL H2O daily.  With free water flush of 200 mL Q4hrs patient will receive 2269 mL H2O daily including water in tube feeding.  NUTRITION DIAGNOSIS:   Inadequate oral intake related to inability to eat as evidenced by NPO status.  Ongoing.  GOAL:   Provide needs based on ASPEN/SCCM guidelines  Met with TF regimen.  MONITOR:   Vent status, Labs, Weight trends, I & O's  REASON FOR ASSESSMENT:   Ventilator    ASSESSMENT:   53 year old female with PMHx of HTN, EtOH abuse, portal HTN, PUD admitted with hypovolemic/septic shock secondary to acute GI bleed, likely alcoholic hepatitis, with AKI, and evidence of DTs, required intubation on 8/8.  Patient intubated but not requiring any sedation. On PRVC mode with FiO2 28% and PEEP 5 cmH2O. Abdomen distended per RN documentation. Having type 7 BMs per rectal tube. Tube feeds were started on 8/14 and patient is tolerating. Family meeting today and plan is for family to decide on one-way extubation on 8/20. Per chart family is leaning towards one-way extubation. Will not make any changes to tube feed regimen at this time due to this.  Enteral Access: OGT placed 8/14; terminates in stomach per abdominal x-ray 8/14; 55 cm at corner of mouth  MAP: 79-94 mmHg  TF: Vital AF 1.2 at 55 mL/hr + free water flush of 200 mL Q4hrs  Patient is currently intubated on ventilator support Ve: 15.3 L/min Temp (24hrs), Avg:99.1 F (37.3 C), Min:98.4 F (36.9 C), Max:100.6 F (38.1 C)  Propofol: N/A  Medications reviewed and include: lactulose 30 grams TID per tube, liquid MVI daily per tube, pantoprazole, vitamin K 10 mg daily per tube, potassium chloride 40 mEq BID per tube, sodium bicarbonate tablet 1300 mg BID per tube, thiamine 100 mg daily per tube, cefepime,  Diflucan 200 mg daily IV.  Labs reviewed: CBG 122-124, Potassium 3, Chloride 117, CO2 19, BUN 50.  I/O: 1950 ml UOP yesterday (0.8 ml/kg/hr); 1950 mL output from rectal tube yesterday  Discussed on rounds this morning.  Diet Order:   Diet Order    None     EDUCATION NEEDS:   No education needs have been identified at this time  Skin:  Skin Assessment: Skin Integrity Issues:(MSAD to groin)  Last BM:  09/14/2018 - large type 7 per rectal tube  Height:   Ht Readings from Last 1 Encounters:  09/05/2018 '5\' 6"'  (1.676 m)   Weight:   Wt Readings from Last 1 Encounters:  09/14/18 97.4 kg   Ideal Body Weight:  59.1 kg  BMI:  Body mass index is 34.66 kg/m.  Estimated Nutritional Needs:   Kcal:  1611  Protein:  90-113 grams (1.2-1.5 grams/kg)  Fluid:  1.9 L/day  Willey Blade, MS, RD, LDN Office: [phone] Pager: [phone] After Hours/Weekend Pager: [phone]

## 2018-09-14 NOTE — Progress Notes (Addendum)
Daily Progress Note   Patient Name: Mackenzie Little       Date: 09/14/2018 DOB: Dec 29, 1965  Age: 53 y.o. MRN#: [id number] Attending Physician: [name], MD Primary Care Physician: Ranae Plumber, [place] Admit Date: 09/13/2018  Reason for Consultation/Follow-up: Establishing goals of care  Subjective: Patient is resting in bed on ventilator. Met with husband and son. Discussed diagnoses. Discussed different senerios for her and discussed QOL, independence and dignity. They state they will make a decision on Thursday as they state that was the day they were given. They do not believe she would want a tracheostomy or to live on a ventilator. Husband would like to speak with all the children and have their support. He is worried that his children will be angry with him depending on his decisions.     Length of Stay: 13  Current Medications: Scheduled Meds:  . chlorhexidine gluconate (MEDLINE KIT)  15 mL Mouth Rinse BID  . Chlorhexidine Gluconate Cloth  6 each Topical QHS  . free water  200 mL Per Tube Q4H  . lactulose  30 g Per Tube TID  . mouth rinse  15 mL Mouth Rinse 10 times per day  . multivitamin  15 mL Per Tube Daily  . pantoprazole sodium  40 mg Per Tube Q1200  . phytonadione  10 mg Per Tube Daily  . rifaximin  550 mg Per Tube BID  . sodium bicarbonate  1,300 mg Per Tube BID  . sodium chloride flush  10-40 mL Intracatheter Q12H  . thiamine  100 mg Per Tube Daily    Continuous Infusions: . sodium chloride Stopped (09/11/18 0206)  . ceFEPime (MAXIPIME) IV 2 g (09/14/18 0932)  . feeding supplement (VITAL AF 1.2 CAL)    . fluconazole (DIFLUCAN) IV 200 mg (09/14/18 1158)    PRN Meds: sodium chloride, metoprolol tartrate, [DISCONTINUED] ondansetron **OR** ondansetron (ZOFRAN) IV,  sodium chloride flush  Physical Exam Pulmonary:     Comments: On vent Neurological:     Comments: No sedation. Not alert.              Vital Signs: BP 130/64   Pulse 98   Temp 99 F (37.2 C) (Oral)   Resp (!) 32   Ht '5\' 6"'  (1.676 m)   Wt 97.4 kg   SpO2 94%  BMI 34.66 kg/m  SpO2: SpO2: 94 % O2 Device: O2 Device: Ventilator O2 Flow Rate: O2 Flow Rate (L/min): (S) 15 L/min  Intake/output summary:   Intake/Output Summary (Last 24 hours) at 09/14/2018 1443 Last data filed at 09/14/2018 9381 Gross per 24 hour  Intake 1451.71 ml  Output 4275 ml  Net -2823.29 ml   LBM: Last BM Date: 09/14/18 Baseline Weight: Weight: 77.1 kg Most recent weight: Weight: 97.4 kg       Palliative Assessment/Data:      Patient Active Problem List   Diagnosis Date Noted  . Cirrhosis of liver ([place])   . Lactic acidosis   . Liver failure without hepatic coma ([place])   . GI bleed 09/06/2018  . Chest pain 07/06/2017  . Well woman exam 10/17/2015  . Anemia 06/04/2015  . Generalized anxiety disorder 05/02/2015  . Elevated blood pressure (not hypertension) 08/21/2014  . Menopausal disorder 08/21/2014    Palliative Care Assessment & Plan    Recommendations/Plan:  Will be prepared on Thursday to make a decision as that is the day they were told they would need to make a decision. Sound to be leaning toward one way extubation.    Code Status:    Code Status Orders  (From admission, onward)         Start     Ordered   09/11/2018 2026  Full code  Continuous     09/08/2018 2025        Code Status History    Date Active Date Inactive Code Status Order ID Comments User Context   07/06/2017 0709 07/08/2017 1928 Full Code [id number]  Arta Silence, MD ED   Advance Care Planning Activity       Prognosis:  Very poor.   Discharge Planning:  To Be Determined  Care plan was discussed with RN  Thank you for allowing the Palliative Medicine Team to assist in the care of this  patient.   Total Time 1:40-2:50 Prolonged Time Billed yes      Greater than 50%  of this time was spent counseling and coordinating care related to the above assessment and plan.  Asencion Gowda, NP  Please contact Palliative Medicine Team phone at [phone] for questions and concerns.

## 2018-09-15 ENCOUNTER — Inpatient Hospital Stay: Payer: Medicaid Other

## 2018-09-15 DIAGNOSIS — R918 Other nonspecific abnormal finding of lung field: Secondary | ICD-10-CM

## 2018-09-15 LAB — COMPREHENSIVE METABOLIC PANEL
ALT: UNDETERMINED U/L (ref 0–44)
AST: 114 U/L — ABNORMAL HIGH (ref 15–41)
Albumin: 1.6 g/dL — ABNORMAL LOW (ref 3.5–5.0)
Alkaline Phosphatase: 106 U/L (ref 38–126)
Anion gap: 5 (ref 5–15)
BUN: 53 mg/dL — ABNORMAL HIGH (ref 6–20)
CO2: 20 mmol/L — ABNORMAL LOW (ref 22–32)
Calcium: 8 mg/dL — ABNORMAL LOW (ref 8.9–10.3)
Chloride: 128 mmol/L — ABNORMAL HIGH (ref 98–111)
Creatinine, Ser: UNDETERMINED mg/dL (ref 0.44–1.00)
Glucose, Bld: 133 mg/dL — ABNORMAL HIGH (ref 70–99)
Potassium: 3.7 mmol/L (ref 3.5–5.1)
Sodium: 153 mmol/L — ABNORMAL HIGH (ref 135–145)
Total Bilirubin: 25.7 mg/dL (ref 0.3–1.2)
Total Protein: 7 g/dL (ref 6.5–8.1)

## 2018-09-15 LAB — GLUCOSE, CAPILLARY
Glucose-Capillary: 116 mg/dL — ABNORMAL HIGH (ref 70–99)
Glucose-Capillary: 108 mg/dL — ABNORMAL HIGH (ref 70–99)
Glucose-Capillary: 117 mg/dL — ABNORMAL HIGH (ref 70–99)
Glucose-Capillary: 136 mg/dL — ABNORMAL HIGH (ref 70–99)
Glucose-Capillary: 121 mg/dL — ABNORMAL HIGH (ref 70–99)

## 2018-09-15 LAB — CBC
HCT: 18.7 % — ABNORMAL LOW (ref 36.0–46.0)
Hemoglobin: 6.3 g/dL — ABNORMAL LOW (ref 12.0–15.0)
MCH: 26.9 pg (ref 26.0–34.0)
MCHC: 33.7 g/dL (ref 30.0–36.0)
MCV: 79.9 fL — ABNORMAL LOW (ref 80.0–100.0)
Platelets: 101 10*3/uL — ABNORMAL LOW (ref 150–400)
RBC: 2.34 MIL/uL — ABNORMAL LOW (ref 3.87–5.11)
RDW: 20.5 % — ABNORMAL HIGH (ref 11.5–15.5)
WBC: 25 10*3/uL — ABNORMAL HIGH (ref 4.0–10.5)
nRBC: 1.8 % — ABNORMAL HIGH (ref 0.0–0.2)

## 2018-09-15 LAB — PROCALCITONIN: Procalcitonin: 1.57 ng/mL

## 2018-09-15 MED ORDER — DEXTROSE 5 % IV SOLN
INTRAVENOUS | Status: AC
  Administered 2018-09-15 (×2): via INTRAVENOUS

## 2018-09-15 MED ORDER — ACETAMINOPHEN 10 MG/ML IV SOLN
1000.0000 mg | Freq: Once | INTRAVENOUS | Status: AC
  Administered 2018-09-15: 1000 mg via INTRAVENOUS
  Filled 2018-09-15: qty 100

## 2018-09-15 MED ORDER — LACTULOSE 10 GM/15ML PO SOLN
20.0000 g | Freq: Two times a day (BID) | ORAL | Status: DC
  Administered 2018-09-15 – 2018-09-18 (×6): 20 g
  Filled 2018-09-15 (×6): qty 30

## 2018-09-15 NOTE — Progress Notes (Addendum)
[facility] [place] NAME: Mackenzie Little    MR#:  [id number]  DATE OF BIRTH:  1966/01/27  SUBJECTIVE:   Patient still off sedation but does not follow commands.  No acute changes overnight. REVIEW OF SYSTEMS:  ROS  On mechanical ventilator  DRUG ALLERGIES:  No Known Allergies  VITALS:  Blood pressure (!) 122/58, pulse 96, temperature (!) 101.3 F (38.5 C), temperature source Oral, resp. rate (!) 32, height 5' 5.98" (1.676 m), weight 100.8 kg, SpO2 96 %.  PHYSICAL EXAMINATION:   Physical Exam  GENERAL:  53 y.o.-year-old patient lying in the bed on ventilator EYES: Pupils 3 mm barely reactive. Scleral icteric HEENT: Head atraumatic, normocephalic. Oropharynx endotracheal tube noted NECK:  Supple, no jugular venous distention. No thyroid enlargement, no tenderness.  LUNGS: no rhonchii this am  cARDIOVASCULAR: S1, S2 normal. No murmurs, rubs, or gallops.  ABDOMEN: Soft, nontender, nondistended. Bowel sounds present. No organomegaly or mass.  EXTREMITIES: No cyanosis, clubbing or edema b/l.    NEUROLOGIC: Patient with downward gaze.  Pupils are 3 mm sluggish.  Does not respond to deep sternal rub.  Patient is intubated.   PSYCHIATRIC: The patient is sedated on vent SKIN: No obvious rash, lesion, or ulcer.  JAUNDICE LABORATORY PANEL:   CBC Recent Labs  Lab 09/15/18 0435  WBC 25.0*  HGB 6.3*  HCT 18.7*  PLT 101*   ------------------------------------------------------------------------------------------------------------------ Chemistries  Recent Labs  Lab 09/14/18 0423 09/15/18 0435  NA 142 153*  K 3.0* 3.7  CL 117* 128*  CO2 19* 20*  GLUCOSE 342* 133*  BUN 50* 53*  CREATININE UNABLE TO REPORT DUE TO ICTERUS UNABLE TO REPORT DUE TO ICTERUS  CALCIUM 7.9* 8.0*  MG 1.9  --   AST 124* 114*  ALT UNABLE TO REPORT DUE TO ICTERUS UNABLE TO REPORT DUE TO ICTERUS  ALKPHOS 126 106  BILITOT 26.5* 25.7*    ------------------------------------------------------------------------------------------------------------------  Cardiac Enzymes No results for input(s): TROPONINI in the last 168 hours. ------------------------------------------------------------------------------------------------------------------  RADIOLOGY:  Dg Chest Port 1 View  Result Date: 09/15/2018 CLINICAL DATA:  Respiratory failure. EXAM: PORTABLE CHEST 1 VIEW COMPARISON:  One-view chest x-ray 09/14/2018 FINDINGS: The endotracheal tube terminates at the carina and could be pulled back 2-3 cm for more optimal positioning. Bilateral interstitial and airspace disease is again seen, right greater than left. There is slight improvement. NG tube courses off the inferior border the film. Right-sided PICC line is stable. IMPRESSION: 1. Endotracheal tube remains at the level of the carina and could be pulled back 2-3 cm for more optimal positioning. 2. Slight improvement in persistent bilateral interstitial and airspace disease, right greater than left. Findings are most consistent with multifocal pneumonia. Electronically Signed   By: San Morelle M.D.   On: 09/15/2018 06:18   Dg Chest Port 1 View  Result Date: 09/14/2018 CLINICAL DATA:  Respiratory failure EXAM: PORTABLE CHEST 1 VIEW COMPARISON:  09/13/2018 FINDINGS: Cardiac shadow is enlarged but stable. Endotracheal tube is again seen at the level of the carina and could be withdrawn 1-2 cm. Right-sided PICC line and gastric catheter are seen. The lungs are well aerated with patchy infiltrates right greater than left stable from the prior exam. No new focal abnormality is noted. IMPRESSION: No change from the prior exam. The endotracheal tube is seen at the level of the carina and could be withdrawn 1-2 cm. Electronically Signed   By: Inez Catalina M.D.   On: 09/14/2018 07:07  ASSESSMENT AND PLAN:   53 year old female patientwith a known history ofhypertension, alcohol  abuse presented to the emergency room for generalized weakness.Patient has been fatigued for the last 3 days and noticed dark tarry stool for the last couple of days.   Patient was tired and fatigued and had labored breathing this morning and has been electively intubated and put on ventilator by intensivist.  -Hypovolemic shock and multiorgan failure -due to active GI bleed -  Initial labs with evidence of acute blood loss (Hgb 6.5), and elevated INR, elevated troponins, and evidence of AKI (Cr 2.47, GFR 25) - on vasopressors to maintain MAP >65. Status post 2U PRBC and FFP    -Acute respiratory failure with hypoxia Mechanical ventilator as per intensivist.   -Multilobar pneumonia Continue cefepime -Acute gastrointestinal bleeding with acute blood loss anemia EGD shows with LA grade C esophagitis with no bleeding and severe portal hypertensive gastropathy.  No endoscopic evidence of varices.  1 nonbleeding cratered duodenal ulcer with no stigmata of bleeding. Recommendation for IV PPI She is status post 2 unit PRBC Hemoglobin 6.3 today. Will need blood transfusion   -Abnormal liver function tests Due to hypovolemic shock   -Acute kidney injury:  Due to ATN from hypovolemic shock Avoid nephrotoxic medications.  -Elevated troponins : Due to demand ischemia from hypovolemic shock  -Acute hypokalemia: Replete PRN  -Acute metabolic/hepatic encephalopathy: severe diffuse encephalopathy, secondary to metabolic etiology. No seizures or definite epileptiform discharges were seen throughout the recording. Cont lactulose/rifaimin Cont thiamine  -Yeast UTI: on diflucan   -Hypernatremia: Need to adjust fluids and NG tube feedings   Overall prognosis very poor.  Patient with multiorgan failure Intensivist met with family.  They would like to continue current management for few more days. They would like to make her DNR.  CODE STATUS: DNR DVT Prophylaxis: SCDs  TOTAL TIME TAKING  CARE OF THIS PATIENT: 24 minutes.   POSSIBLE D/C IN?? , DEPENDING ON CLINICAL CONDITION.  [name] M.D on 09/15/2018 at 12:22 PM  Between 7am to 6pm - Pager - [phone]  After 6pm go to [email or web address] - password [email or web address] [facility]  Office  [phone]  CC: Primary care physician; Ranae Plumber, PA  Note: This dictation was prepared with Dragon dictation along with smaller phrase technology. Any transcriptional errors that result from this process are unintentional.

## 2018-09-15 NOTE — Progress Notes (Signed)
PULMONARY/CCM PROGRESS NOTE  PT PROFILE: 53 y.o. F adm 08/05 with hypovolemic shock due to melena, weakness with a history of alcoholic cirrhosis.  Other problems on admission include AKI, lactic acidosis, elevated troponin.  MAJOR EVENTS/TEST RESULTS: 08/05 gastroenterology consultation for GI bleeding 08/05 cardiology consultation for elevated troponin 08/06 abdominal ultrasound: No ascites 08/08 pelvic ultrasound: Normal 08/08 intubated for respiratory distress, suspected aspiration 08/11 echocardiogram: LVEF 50-55%.  LV cavity mildly dilated.  Moderate LVH.  Doppler parameters consistent with diastolic dysfunction.  LA moderately dilated. 08/13 palliative care consultation 08/14 EGD: Distal esophagitis without active bleeding.  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  No endoscopic evidence of varices in the entire examined stomach. 08/15 CT head: normal 08/16 Neurology consultation: Suspect severe TME. Prognosis deemed to likely be poor 08/17 Remains comatose off of all sedation 08/17 EEG: severe diffuse encephalopathy, likely secondary to metabolic etiology. No seizures or definite epileptiform discharges  08/18 no change.  Remains comatose 08/19 Fever. OTherwise, no changes. Remains comatose  INDWELLING DEVICES: R femoral CVL 08/05 >> 08/13 ETT 08/08 >>  RUE PICC 08/13 >>   MICRO DATA: SARS-CoV-2 PCR 8/5 >>negative MRSA PCR 8/5>> negative Resp 8/9 >> MSSA Blood 8/5 >> negative Blood 8/9 >>negative Urine 08/17 >> greater than 100k colonies/mL yeast Resp 08/17 >>  Blood 08/17 >>  PCT 08/17: 2.25, 1.81, 1.57   ANTIMICROBIALS:  Vancomycin 8/5 >> 8/6 Cefepime 8/5 >> 8/11 Metronidazole 8/6 >> 8/11 Ampicillin-sulbactam 8/11 >> 08/15  Vancomycin 08/17 >> 08/18 Cefepime 08/17 >>  Fluconazole 08/18 >>   SUBJ: Remains intubated.  Remains comatose   OBJ: Vitals:   09/15/18 1150 09/15/18 1200 09/15/18 1225 09/15/18 1300  BP:  129/64   116/60  Pulse:  99  96  Resp:  (!) 28  (!) 26  Temp:   99.6 F (37.6 C)   TempSrc:   Oral   SpO2: 96% 93%  94%  Weight:      Height:       Vent Mode: PRVC FiO2 (%):  [28 %] 28 % Set Rate:  [16 bmp] 16 bmp Vt Set:  [500 mL] 500 mL PEEP:  [5 cmH20] 5 cmH20   Gen: Intubated, comatose HEENT: NCAT, severe scleral icterus Neck: No JVD noted Lungs: Few rhonchi, no wheezes Cardiovascular: Regular, no M Abdomen: Distended, soft, + BS Ext: Symmetric BUE and BLE edema Neuro: No spontaneous movement.  No withdrawal from pain.  Pupils equal and react Skin: No lesions noted  BMP Latest Ref Rng & Units 09/15/2018 09/14/2018 09/13/2018  Glucose 70 - 99 mg/dL 133(H) 342(H) 122(H)  BUN 6 - 20 mg/dL 53(H) 50(H) 63(H)  Creatinine 0.44 - 1.00 mg/dL UNABLE TO REPORT DUE TO ICTERUS UNABLE TO REPORT DUE TO ICTERUS UNABLE TO REPORT DUE TO ICTERUS  Sodium 135 - 145 mmol/L 153(H) 142 147(H)  Potassium 3.5 - 5.1 mmol/L 3.7 3.0(L) 2.9(L)  Chloride 98 - 111 mmol/L 128(H) 117(H) 111  CO2 22 - 32 mmol/L 20(L) 19(L) 20(L)  Calcium 8.9 - 10.3 mg/dL 8.0(L) 7.9(L) 8.6(L)    Hepatic Function Latest Ref Rng & Units 09/15/2018 09/14/2018 09/13/2018  Total Protein 6.5 - 8.1 g/dL 7.0 6.9 7.3  Albumin 3.5 - 5.0 g/dL 1.6(L) 1.8(L) 2.1(L)  AST 15 - 41 U/L 114(H) 124(H) 102(H)  ALT 0 - 44 U/L UNABLE TO REPORT DUE TO ICTERUS UNABLE TO REPORT DUE TO ICTERUS UNABLE TO REPORT DUE TO ICTERUS  Alk Phosphatase 38 - 126 U/L 106 126 126  Total Bilirubin 0.3 - 1.2 mg/dL 25.7(HH) 26.5(HH) 28.4(HH)  Bilirubin, Direct 0.0 - 0.2 mg/dL - 15.0(H) 15.7(H)    CBC Latest Ref Rng & Units 09/15/2018 09/14/2018 09/13/2018  WBC 4.0 - 10.5 K/uL 25.0(H) 30.9(H) 30.3(H)  Hemoglobin 12.0 - 15.0 g/dL 6.3(L) 6.6(L) 7.7(L)  Hematocrit 36.0 - 46.0 % 18.7(L) 19.7(L) 22.6(L)  Platelets 150 - 400 K/uL 101(L) 107(L) 100(L)    ABG    Component Value Date/Time   PHART 7.47 (H) 09/14/2018 2300   PCO2ART 29 (L) 09/14/2018 2300   PO2ART 86  09/14/2018 2300   HCO3 21.1 09/14/2018 2300   ACIDBASEDEF 1.5 09/14/2018 2300   O2SAT 97.1 09/14/2018 2300   Ammonia 120, 83, 71  CXR: Generally improved infiltrates with persistent confluence in RLL   IMPRESSION: Prolonged ventilator dependent respiratory failure Aspiration pneumonia - now fully treated Hemorrhagic shock, resolved AKI, nonoliguric.  Cr cannot be tracked due to very high bilirubin Metabolic acidosis, now mild Persistent hypokalemia, resolved Hypernatremia Severe alcoholic cirrhosis Acute hepatic failure due to alcoholic hepatitis UGI bleed due to portal gastropathy Acute blood loss anemia - no overt bleeding presently Moderate thrombocytopenia Coagulopathy due to liver failure Low-grade fever, leukocytosis, mildly elevated PCT Possible RLL HCAP Pyuria, funguria Severe hepatic encephalopathy Severe hyperammonemia  PLAN/REC: Cont vent support - settings reviewed and/or adjusted Continue PSV mode as tolerated Cont vent bundle Daily SBT if/when meets criteria ICU hemodynamic monitoring.  MAP goal > 65 mmHg Monitor BMET intermittently Monitor I/Os Correct electrolytes as indicated Cont enteral bicarbonate D5W 08/19 Continue TF protocol SUP: Enteral pantoprazole Monitor LFTs intermittently Monitor temp, WBC count Micro and abx as above DVT px: SCDs Monitor CBC intermittently Transfuse per usual guidelines Monitor coags intermittently Continue daily enteral vitamin K - initiated 8/15 Continue lactulose and rifaximin - initiated 08/14 Continue to monitor neurologic status off of all sedative medications   No family members available to provide update  CCM time: 34 mins The above time includes time spent in consultation with patient and/or family members and reviewing care plan on multidisciplinary rounds  Merton Border, MD PCCM service Mobile [phone] Pager [phone] 09/15/2018 2:55 PM

## 2018-09-16 LAB — GLUCOSE, CAPILLARY
Glucose-Capillary: 117 mg/dL — ABNORMAL HIGH (ref 70–99)
Glucose-Capillary: 129 mg/dL — ABNORMAL HIGH (ref 70–99)
Glucose-Capillary: 133 mg/dL — ABNORMAL HIGH (ref 70–99)
Glucose-Capillary: 134 mg/dL — ABNORMAL HIGH (ref 70–99)
Glucose-Capillary: 134 mg/dL — ABNORMAL HIGH (ref 70–99)
Glucose-Capillary: 136 mg/dL — ABNORMAL HIGH (ref 70–99)
Glucose-Capillary: 137 mg/dL — ABNORMAL HIGH (ref 70–99)

## 2018-09-16 LAB — CULTURE, RESPIRATORY W GRAM STAIN

## 2018-09-16 MED ORDER — MORPHINE SULFATE (PF) 2 MG/ML IV SOLN
2.0000 mg | Freq: Once | INTRAVENOUS | Status: AC
  Administered 2018-09-16: 2 mg via INTRAVENOUS
  Filled 2018-09-16: qty 1

## 2018-09-16 MED ORDER — METOPROLOL TARTRATE 5 MG/5ML IV SOLN
2.5000 mg | Freq: Once | INTRAVENOUS | Status: DC
  Filled 2018-09-16: qty 5

## 2018-09-16 MED ORDER — DEXTROSE 5 % IV SOLN
INTRAVENOUS | Status: AC
  Administered 2018-09-16: 11:00:00 via INTRAVENOUS

## 2018-09-16 NOTE — Progress Notes (Signed)
Ch was present to support pt family as they consider extubating the pt today and transition to comfort care. Ch provided a prayer shawl and prayed bedside with family present for God to reveal the best way to help with transitioning the pt. Ch read aloud Psalms 34 and provided a compassionate presence as the family contemplated the best way to move forward. Ch was present while pt family was consulted on the possible outcomes post-extubation as the pt begun responding to to verbal stimulus for the first time. Pt family were educated and given more time to make a decision.  Goals: F/U with care team on EOL/POC for pt; provided spiritual support for family present    09/16/18 1605  Clinical Encounter Type  Visited With Patient and family together;Health care provider  Visit Type Psychological support;Spiritual support;Social support;Critical Care (extubation removal consult )  Referral From Nurse  Consult/Referral To Chaplain  Spiritual Encounters  Spiritual Needs Emotional;Grief support;Prayer;Sacred text  Stress Factors  Patient Stress Factors Exhausted;Health changes;Major life changes;Loss of control  Family Stress Factors Exhausted;Lack of knowledge;Loss;Loss of control;Major life changes

## 2018-09-16 NOTE — Progress Notes (Addendum)
Pts family remain at bedside they are uncertain if they want to proceed with terminal extubation today.  Throughout the earlier part of today pt has been unresponsive and not responding to any form of stimulation.  However, when family arrived at bedside this evening around 1700 according to the family the pt opened her eyes when they called her name.  When I arrived at bedside the pts eyes were open, however she was still unable to follow any commands.  She continued to intermittently open her eyes following this with loud verbal stimulation, however I am uncertain if this was purposeful.  I spoke with pts daughter, 2 sons, and husband regarding goals of treatment.  I discussed pts poor prognosis and explained to the family due to continued acute encephalopathy we have not been able to extubate Mrs. Louk and she would need a tracheostomy if they decide to continue treatment.  They all understand she would need to go to a facility for rehabilitation if they proceed with tracheostomy and there is a possibility she may not be able to return home.  They also understand she could also have a poor quality of life if they proceed with tracheostomy placement.  Will continue to monitor and assess pt.  Marda Stalker, [place] Pager [phone] (please enter 7 digits) PCCM Consult Pager [phone] (please enter 7 digits)

## 2018-09-16 NOTE — Progress Notes (Signed)
[place] [facility] [place] NAME: Mackenzie Little    MR#:  [id number]  DATE OF BIRTH:  1965-10-03  SUBJECTIVE:   Patient still off sedation but does not follow commands.  No acute changes overnight. Decision is made to terminally extubate today and transition to comfort care later afternoon. REVIEW OF SYSTEMS:  ROS  On mechanical ventilator  DRUG ALLERGIES:  No Known Allergies  VITALS:  Blood pressure (!) 105/56, pulse 81, temperature 99.9 F (37.7 C), temperature source Oral, resp. rate (!) 21, height 5' 5.98" (1.676 m), weight 103 kg, SpO2 97 %.  PHYSICAL EXAMINATION:   Physical Exam  GENERAL:  53 y.o.-year-old patient lying in the bed on ventilator EYES: Pupils 3 mm barely reactive. Scleral icteric HEENT: Head atraumatic, normocephalic. Oropharynx endotracheal tube noted NECK:  Supple, no jugular venous distention. No thyroid enlargement, no tenderness.  LUNGS: no rhonchii this am  cARDIOVASCULAR: S1, S2 normal. No murmurs, rubs, or gallops.  ABDOMEN: Soft, nontender, nondistended. Bowel sounds present. No organomegaly or mass.  EXTREMITIES: No cyanosis, clubbing or edema b/l.    NEUROLOGIC: Patient with downward gaze.  Pupils are 3 mm sluggish.  Does not respond to deep sternal rub.  Patient is intubated.   PSYCHIATRIC: The patient is sedated on vent SKIN: No obvious rash, lesion, or ulcer.  JAUNDICE LABORATORY PANEL:   CBC Recent Labs  Lab 09/15/18 0435  WBC 25.0*  HGB 6.3*  HCT 18.7*  PLT 101*   ------------------------------------------------------------------------------------------------------------------ Chemistries  Recent Labs  Lab 09/14/18 0423 09/15/18 0435  NA 142 153*  K 3.0* 3.7  CL 117* 128*  CO2 19* 20*  GLUCOSE 342* 133*  BUN 50* 53*  CREATININE UNABLE TO REPORT DUE TO ICTERUS UNABLE TO REPORT DUE TO ICTERUS  CALCIUM 7.9* 8.0*  MG 1.9  --   AST 124* 114*  ALT UNABLE TO REPORT DUE TO ICTERUS UNABLE  TO REPORT DUE TO ICTERUS  ALKPHOS 126 106  BILITOT 26.5* 25.7*   ------------------------------------------------------------------------------------------------------------------  Cardiac Enzymes No results for input(s): TROPONINI in the last 168 hours. ------------------------------------------------------------------------------------------------------------------  RADIOLOGY:  Dg Chest Port 1 View  Result Date: 09/15/2018 CLINICAL DATA:  Respiratory failure. EXAM: PORTABLE CHEST 1 VIEW COMPARISON:  One-view chest x-ray 09/14/2018 FINDINGS: The endotracheal tube terminates at the carina and could be pulled back 2-3 cm for more optimal positioning. Bilateral interstitial and airspace disease is again seen, right greater than left. There is slight improvement. NG tube courses off the inferior border the film. Right-sided PICC line is stable. IMPRESSION: 1. Endotracheal tube remains at the level of the carina and could be pulled back 2-3 cm for more optimal positioning. 2. Slight improvement in persistent bilateral interstitial and airspace disease, right greater than left. Findings are most consistent with multifocal pneumonia. Electronically Signed   By: San Morelle M.D.   On: 09/15/2018 06:18     ASSESSMENT AND PLAN:   53 year old female patientwith a known history ofhypertension, alcohol abuse presented to the emergency room for generalized weakness.Patient has been fatigued for the last 3 days and noticed dark tarry stool for the last couple of days.   Patient was tired and fatigued and had labored breathing this morning and has been electively intubated and put on ventilator by intensivist.  -Hypovolemic shock and multiorgan failure -due to active GI bleed -  Initial labs with evidence of acute blood loss (Hgb 6.5), and elevated INR, elevated troponins, and evidence of AKI (Cr 2.47, GFR 25) -  on vasopressors to maintain MAP >65. Status post 2U PRBC and FFP   -Now  transitioning to comfort care  -Acute respiratory failure with hypoxia Mechanical ventilator as per intensivist. -Plan for terminal extubation today.  -Multilobar pneumonia Continue cefepime -Acute gastrointestinal bleeding with acute blood loss anemia EGD shows with LA grade C esophagitis with no bleeding and severe portal hypertensive gastropathy.  No endoscopic evidence of varices.  1 nonbleeding cratered duodenal ulcer with no stigmata of bleeding. Recommendation for IV PPI She is status post 2 unit PRBC Hemoglobin 6.3 Transitioning to comfort care today   -Abnormal liver function tests Due to hypovolemic shock   -Acute kidney injury:  Due to ATN from hypovolemic shock Avoid nephrotoxic medications.  -Elevated troponins : Due to demand ischemia from hypovolemic shock  -Acute hypokalemia: Replete PRN  -Acute metabolic/hepatic encephalopathy: severe diffuse encephalopathy, secondary to metabolic etiology. No seizures or definite epileptiform discharges were seen throughout the recording. Cont lactulose/rifaimin Cont thiamine  -Yeast UTI: on diflucan   -Hypernatremia: Need to adjust fluids and NG tube feedings   Overall prognosis very poor.  Patient with multiorgan failure Intensivist met with family.  They have decided to terminally extubate today and transition to comfort care.  CODE STATUS: DNR DVT Prophylaxis: SCDs  TOTAL TIME TAKING CARE OF THIS PATIENT: 24 minutes.   POSSIBLE D/C IN?? , DEPENDING ON CLINICAL CONDITION.  Vaughan Basta M.D on 09/16/2018 at 5:38 PM  Between 7am to 6pm - Pager - [phone]  After 6pm go to [email or web address] - password [email or web address] [facility]  Office  [phone]  CC: Primary care physician; Ranae Plumber, PA  Note: This dictation was prepared with Dragon dictation along with smaller phrase technology. Any transcriptional errors that result from this process are unintentional.

## 2018-09-16 NOTE — Progress Notes (Addendum)
PULMONARY/CCM PROGRESS NOTE  PT PROFILE: 53 y.o. F adm 08/05 with hypovolemic shock due to melena, weakness with a history of alcoholic cirrhosis.  Other problems on admission include AKI, lactic acidosis, elevated troponin.  MAJOR EVENTS/TEST RESULTS: 08/05 gastroenterology consultation for GI bleeding 08/05 cardiology consultation for elevated troponin 08/06 abdominal ultrasound: No ascites 08/08 pelvic ultrasound: Normal 08/08 intubated for respiratory distress, suspected aspiration 08/11 echocardiogram: LVEF 50-55%.  LV cavity mildly dilated.  Moderate LVH.  Doppler parameters consistent with diastolic dysfunction.  LA moderately dilated. 08/13 palliative care consultation 08/14 EGD: Distal esophagitis without active bleeding.  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  No endoscopic evidence of varices in the entire examined stomach. 08/15 CT head: normal 08/16 Neurology consultation: Suspect severe TME. Prognosis deemed to likely be poor 08/17 Remains comatose off of all sedation 08/17 EEG: severe diffuse encephalopathy, likely secondary to metabolic etiology. No seizures or definite epileptiform discharges  08/18 no change.  Remains comatose 08/19 Fever. Otherwise, no changes. Remains comatose  INDWELLING DEVICES: R femoral CVL 08/05 >> 08/13 ETT 08/08 >>  RUE PICC 08/13 >>   MICRO DATA: SARS-CoV-2 PCR 8/5 >>negative MRSA PCR 8/5>> negative Resp 8/9 >> MSSA Blood 8/5 >> negative Blood 8/9 >>negative Urine 08/17 >> greater than 100k colonies/mL yeast Resp 08/17 >>negative  Blood 08/17 >>negative  PCT 08/17: 2.25, 1.81, 1.57   ANTIMICROBIALS:  Vancomycin 8/5 >> 8/6 Cefepime 8/5 >> 8/11 Metronidazole 8/6 >> 8/11 Ampicillin-sulbactam 8/11 >> 08/15  Vancomycin 08/17 >> 08/18 Cefepime 08/17 >>  Fluconazole 08/18 >>   SUBJ: Remains intubated.  Remains comatose   OBJ: Vitals:   09/16/18 0730 09/16/18 0746 09/16/18 0800 09/16/18 0900   BP: (!) 107/56  (!) 102/55 (!) 105/54  Pulse: 88  88 89  Resp: (!) 23  (!) 26 (!) 24  Temp: 99.5 F (37.5 C)     TempSrc: Oral     SpO2: 97% 97% 96% 97%  Weight:      Height:       Vent Mode: PRVC FiO2 (%):  [28 %] 28 % Set Rate:  [16 bmp] 16 bmp Vt Set:  [500 mL] 500 mL PEEP:  [5 cmH20] 5 cmH20 Plateau Pressure:  [21 cmH20] 21 cmH20   Gen: Intubated, comatose HEENT: NCAT, severe scleral icterus Neck: No JVD noted Lungs: Few rhonchi, no wheezes, slightly tachypneic  Cardiovascular: Regular, no M Abdomen: Distended, soft, + BS Ext: Symmetric BUE and BLE edema Neuro: No spontaneous movement.  No withdrawal from pain.  Pupils equal and react Skin: No lesions noted  BMP Latest Ref Rng & Units 09/15/2018 09/14/2018 09/13/2018  Glucose 70 - 99 mg/dL 133(H) 342(H) 122(H)  BUN 6 - 20 mg/dL 53(H) 50(H) 63(H)  Creatinine 0.44 - 1.00 mg/dL UNABLE TO REPORT DUE TO ICTERUS UNABLE TO REPORT DUE TO ICTERUS UNABLE TO REPORT DUE TO ICTERUS  Sodium 135 - 145 mmol/L 153(H) 142 147(H)  Potassium 3.5 - 5.1 mmol/L 3.7 3.0(L) 2.9(L)  Chloride 98 - 111 mmol/L 128(H) 117(H) 111  CO2 22 - 32 mmol/L 20(L) 19(L) 20(L)  Calcium 8.9 - 10.3 mg/dL 8.0(L) 7.9(L) 8.6(L)    Hepatic Function Latest Ref Rng & Units 09/15/2018 09/14/2018 09/13/2018  Total Protein 6.5 - 8.1 g/dL 7.0 6.9 7.3  Albumin 3.5 - 5.0 g/dL 1.6(L) 1.8(L) 2.1(L)  AST 15 - 41 U/L 114(H) 124(H) 102(H)  ALT 0 - 44 U/L UNABLE TO REPORT DUE TO ICTERUS UNABLE TO REPORT DUE TO ICTERUS UNABLE TO  REPORT DUE TO ICTERUS  Alk Phosphatase 38 - 126 U/L 106 126 126  Total Bilirubin 0.3 - 1.2 mg/dL 25.7(HH) 26.5(HH) 28.4(HH)  Bilirubin, Direct 0.0 - 0.2 mg/dL - 15.0(H) 15.7(H)    CBC Latest Ref Rng & Units 09/15/2018 09/14/2018 09/13/2018  WBC 4.0 - 10.5 K/uL 25.0(H) 30.9(H) 30.3(H)  Hemoglobin 12.0 - 15.0 g/dL 6.3(L) 6.6(L) 7.7(L)  Hematocrit 36.0 - 46.0 % 18.7(L) 19.7(L) 22.6(L)  Platelets 150 - 400 K/uL 101(L) 107(L) 100(L)    ABG    Component  Value Date/Time   PHART 7.47 (H) 09/14/2018 2300   PCO2ART 29 (L) 09/14/2018 2300   PO2ART 86 09/14/2018 2300   HCO3 21.1 09/14/2018 2300   ACIDBASEDEF 1.5 09/14/2018 2300   O2SAT 97.1 09/14/2018 2300   Ammonia 120, 83, 71  CXR: Generally improved infiltrates with persistent confluence in RLL   IMPRESSION: Prolonged ventilator dependent respiratory failure Aspiration pneumonia - now fully treated Hemorrhagic shock, resolved AKI, nonoliguric.  Cr cannot be tracked due to very high bilirubin Metabolic acidosis, now mild Persistent hypokalemia, resolved Hypernatremia Severe alcoholic cirrhosis Acute hepatic failure due to alcoholic hepatitis UGI bleed due to portal gastropathy Acute blood loss anemia - no overt bleeding presently Moderate thrombocytopenia Coagulopathy due to liver failure Low-grade fever, leukocytosis, mildly elevated PCT Possible RLL HCAP Pyuria, funguria Severe hepatic encephalopathy Severe hyperammonemia  PLAN/REC: Cont vent support - settings reviewed and/or adjusted Continue PSV mode as tolerated Cont vent bundle Daily SBT if/when meets criteria ICU hemodynamic monitoring.  MAP goal > 65 mmHg Monitor BMET intermittently Monitor I/Os Correct electrolytes as indicated Cont enteral bicarbonate D5W 08/19 Continue TF protocol SUP: Enteral pantoprazole Monitor LFTs intermittently Monitor temp, WBC count Micro and abx as above DVT px: SCDs Monitor CBC intermittently Transfuse per usual guidelines Monitor coags intermittently Continue daily enteral vitamin K - initiated 8/15 Continue lactulose and rifaximin - initiated 08/14 Continue to monitor neurologic status off of all sedative medications  -Pts family to arrive at bedside 08/20 '@1600'  with plans to transition to comfort measures only will continue current plan of care until they arrive   Marda Stalker, [place] Pager [phone] (please enter 7 digits) PCCM Consult  Pager [phone] (please enter 7 digits)   PCCM ATTENDING ATTESTATION: I have evaluated patient with the APP , reviewed database in its entirety and discussed care plan in detail. In addition, this patient was discussed on multidisciplinary rounds. I have personally reviewed all chest radiographs discussed herein including CXRs and CT chest unless otherwise indicated.  I agree with the above findings, assessment and plan.  Patient remains fully comatose.  She has been off of all sedation for many days.  All physicians involved in her care believe her prognosis for functional recovery to be extremely poor.  Family has struggled to come to grips with this reality but reportedly plans to be present for extubation and comfort measures only at this afternoon.  I strongly support this plan.  Merton Border, MD PCCM service Mobile [phone] Pager [phone] 09/16/2018 2:03 PM

## 2018-09-17 ENCOUNTER — Inpatient Hospital Stay: Payer: Medicaid Other

## 2018-09-17 LAB — CBC
HCT: 16.6 % — ABNORMAL LOW (ref 36.0–46.0)
HCT: 24.3 % — ABNORMAL LOW (ref 36.0–46.0)
Hemoglobin: 5.4 g/dL — ABNORMAL LOW (ref 12.0–15.0)
Hemoglobin: 8 g/dL — ABNORMAL LOW (ref 12.0–15.0)
MCH: 26.6 pg (ref 26.0–34.0)
MCH: 27.4 pg (ref 26.0–34.0)
MCHC: 32.5 g/dL (ref 30.0–36.0)
MCHC: 32.9 g/dL (ref 30.0–36.0)
MCV: 81.8 fL (ref 80.0–100.0)
MCV: 83.2 fL (ref 80.0–100.0)
Platelets: 75 10*3/uL — ABNORMAL LOW (ref 150–400)
Platelets: 75 10*3/uL — ABNORMAL LOW (ref 150–400)
RBC: 2.03 MIL/uL — ABNORMAL LOW (ref 3.87–5.11)
RBC: 2.92 MIL/uL — ABNORMAL LOW (ref 3.87–5.11)
RDW: 21.7 % — ABNORMAL HIGH (ref 11.5–15.5)
RDW: 19.7 % — ABNORMAL HIGH (ref 11.5–15.5)
WBC: 15.4 10*3/uL — ABNORMAL HIGH (ref 4.0–10.5)
WBC: 14.9 10*3/uL — ABNORMAL HIGH (ref 4.0–10.5)
nRBC: 1 % — ABNORMAL HIGH (ref 0.0–0.2)
nRBC: 0.9 % — ABNORMAL HIGH (ref 0.0–0.2)

## 2018-09-17 LAB — COMPREHENSIVE METABOLIC PANEL
ALT: UNDETERMINED U/L (ref 0–44)
AST: 96 U/L — ABNORMAL HIGH (ref 15–41)
Albumin: 1.4 g/dL — ABNORMAL LOW (ref 3.5–5.0)
Alkaline Phosphatase: 87 U/L (ref 38–126)
Anion gap: 6 (ref 5–15)
BUN: 64 mg/dL — ABNORMAL HIGH (ref 6–20)
CO2: 17 mmol/L — ABNORMAL LOW (ref 22–32)
Calcium: 7.3 mg/dL — ABNORMAL LOW (ref 8.9–10.3)
Chloride: 112 mmol/L — ABNORMAL HIGH (ref 98–111)
Creatinine, Ser: UNDETERMINED mg/dL (ref 0.44–1.00)
GFR calc Af Amer: 34 mL/min — ABNORMAL LOW (ref >60)
GFR calc non Af Amer: 30 mL/min — ABNORMAL LOW (ref >60)
Glucose, Bld: 325 mg/dL — ABNORMAL HIGH (ref 70–99)
Potassium: 3.9 mmol/L (ref 3.5–5.1)
Sodium: 135 mmol/L (ref 135–145)
Total Bilirubin: 22.4 mg/dL (ref 0.3–1.2)
Total Protein: 7 g/dL (ref 6.5–8.1)

## 2018-09-17 LAB — GLUCOSE, CAPILLARY
Glucose-Capillary: 126 mg/dL — ABNORMAL HIGH (ref 70–99)
Glucose-Capillary: 141 mg/dL — ABNORMAL HIGH (ref 70–99)
Glucose-Capillary: 147 mg/dL — ABNORMAL HIGH (ref 70–99)
Glucose-Capillary: 149 mg/dL — ABNORMAL HIGH (ref 70–99)
Glucose-Capillary: 128 mg/dL — ABNORMAL HIGH (ref 70–99)

## 2018-09-17 LAB — PREPARE RBC (CROSSMATCH)

## 2018-09-17 MED ORDER — FENTANYL CITRATE (PF) 100 MCG/2ML IJ SOLN
INTRAMUSCULAR | Status: AC
  Filled 2018-09-17: qty 2

## 2018-09-17 MED ORDER — MIDAZOLAM HCL 2 MG/2ML IJ SOLN
INTRAMUSCULAR | Status: AC
  Filled 2018-09-17: qty 2

## 2018-09-17 MED ORDER — MIDAZOLAM HCL 2 MG/2ML IJ SOLN: 2.0000 mg | Freq: Once | INTRAMUSCULAR | Status: DC

## 2018-09-17 MED ORDER — FENTANYL CITRATE (PF) 100 MCG/2ML IJ SOLN: 50.0000 ug | Freq: Once | INTRAMUSCULAR | Status: DC

## 2018-09-17 MED ORDER — SODIUM CHLORIDE 0.9% IV SOLUTION: Freq: Once | INTRAVENOUS | Status: DC

## 2018-09-17 NOTE — Progress Notes (Signed)
PULMONARY/CCM PROGRESS NOTE  PT PROFILE: 53 y.o. F adm 08/05 with hypovolemic shock due to melena, weakness with a history of alcoholic cirrhosis.  Other problems on admission include AKI, lactic acidosis, elevated troponin.  MAJOR EVENTS/TEST RESULTS: 08/05 gastroenterology consultation for GI bleeding 08/05 cardiology consultation for elevated troponin 08/06 abdominal ultrasound: No ascites 08/08 pelvic ultrasound: Normal 08/08 intubated for respiratory distress, suspected aspiration 08/11 echocardiogram: LVEF 50-55%.  LV cavity mildly dilated.  Moderate LVH.  Doppler parameters consistent with diastolic dysfunction.  LA moderately dilated. 08/13 palliative care consultation 08/14 EGD: Distal esophagitis without active bleeding.  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  No endoscopic evidence of varices in the entire examined stomach. 08/15 CT head: normal 08/16 Neurology consultation: Suspect severe TME. Prognosis deemed to likely be poor 08/17 Remains comatose off of all sedation 08/17 EEG: severe diffuse encephalopathy, likely secondary to metabolic etiology. No seizures or definite epileptiform discharges  08/18 no change.  Remains comatose 08/19 Fever. Otherwise, no changes. Remains comatose 08/20-Pts family arrived at bedside around 1700 to proceed with terminal extubation, however  pt opened her eyes with loud verbal stimulation from family.  Therefore, family unable to proceed with terminal extubation and needed more time to discuss goals of treatment.   08/21-Lab values worse: albumin 1.4, hgb 5.4, hct 16.6, and platelet count 75.  Pt to receive 2 units pRBC's.  Pt opening her eyes when stimulated, however not following commands    INDWELLING DEVICES: R femoral CVL 08/05 >> 08/13 ETT 08/08 >>  RUE PICC 08/13 >>   MICRO DATA: SARS-CoV-2 PCR 8/5 >>negative MRSA PCR 8/5>> negative Resp 8/9 >> MSSA Blood 8/5 >> negative Blood 8/9  >>negative Urine 08/17 >> greater than 100k colonies/mL yeast Resp 08/17 >>negative  Blood 08/17 >>negative  PCT 08/17: 2.25, 1.81, 1.57   ANTIMICROBIALS:  Vancomycin 8/5 >> 8/6 Cefepime 8/5 >> 8/11 Metronidazole 8/6 >> 8/11 Ampicillin-sulbactam 8/11 >> 08/15  Vancomycin 08/17 >> 08/18 Cefepime 08/17 >>  Fluconazole 08/18 >>   SUBJ: Remains intubated.  Remains comatose   OBJ: Vitals:   09/17/18 1000 09/17/18 1039 09/17/18 1100 09/17/18 1103  BP: (!) 105/56 (!) 105/56 (!) 106/57 105/60  Pulse: 84 85  84  Resp: (!) 21 (!) 21  20  Temp:  98.7 F (37.1 C)  98.7 F (37.1 C)  TempSrc:  Oral  Oral  SpO2: 97%   99%  Weight:      Height:       Vent Mode: PRVC FiO2 (%):  [28 %] 28 % Set Rate:  [16 bmp] 16 bmp Vt Set:  [500 mL] 500 mL PEEP:  [5 cmH20] 5 cmH20 Plateau Pressure:  [15 cmH20] 15 cmH20   Gen: Intubated, comatose HEENT: NCAT, severe scleral icterus Neck: No JVD noted Lungs: Few rhonchi, no wheezes, slightly tachypneic  Cardiovascular: Regular, no M Abdomen: Distended, soft, + BS Ext: Symmetric BUE and BLE edema Neuro: opening eyes with verbal stimulation, however not following commands or withdrawing from pain stimulation, pupils equal and react Skin: No lesions noted  BMP Latest Ref Rng & Units 09/17/2018 09/15/2018 09/14/2018  Glucose 70 - 99 mg/dL 325(H) 133(H) 342(H)  BUN 6 - 20 mg/dL 64(H) 53(H) 50(H)  Creatinine 0.44 - 1.00 mg/dL UNABLE TO REPORT DUE TO ICTERUS UNABLE TO REPORT DUE TO ICTERUS UNABLE TO REPORT DUE TO ICTERUS  Sodium 135 - 145 mmol/L 135 153(H) 142  Potassium 3.5 - 5.1 mmol/L 3.9 3.7 3.0(L)  Chloride 98 -  111 mmol/L 112(H) 128(H) 117(H)  CO2 22 - 32 mmol/L 17(L) 20(L) 19(L)  Calcium 8.9 - 10.3 mg/dL 7.3(L) 8.0(L) 7.9(L)    Hepatic Function Latest Ref Rng & Units 09/17/2018 09/15/2018 09/14/2018  Total Protein 6.5 - 8.1 g/dL 7.0 7.0 6.9  Albumin 3.5 - 5.0 g/dL 1.4(L) 1.6(L) 1.8(L)  AST 15 - 41 U/L 96(H) 114(H) 124(H)  ALT 0 - 44 U/L  UNABLE TO REPORT DUE TO ICTERUS UNABLE TO REPORT DUE TO ICTERUS UNABLE TO REPORT DUE TO ICTERUS  Alk Phosphatase 38 - 126 U/L 87 106 126  Total Bilirubin 0.3 - 1.2 mg/dL 22.4(HH) 25.7(HH) 26.5(HH)  Bilirubin, Direct 0.0 - 0.2 mg/dL - - 15.0(H)    CBC Latest Ref Rng & Units 09/17/2018 09/15/2018 09/14/2018  WBC 4.0 - 10.5 K/uL 15.4(H) 25.0(H) 30.9(H)  Hemoglobin 12.0 - 15.0 g/dL 5.4(L) 6.3(L) 6.6(L)  Hematocrit 36.0 - 46.0 % 16.6(L) 18.7(L) 19.7(L)  Platelets 150 - 400 K/uL 75(L) 101(L) 107(L)    ABG    Component Value Date/Time   PHART 7.47 (H) 09/14/2018 2300   PCO2ART 29 (L) 09/14/2018 2300   PO2ART 86 09/14/2018 2300   HCO3 21.1 09/14/2018 2300   ACIDBASEDEF 1.5 09/14/2018 2300   O2SAT 97.1 09/14/2018 2300   Ammonia 120, 83, 71  CXR: Generally improved infiltrates with persistent confluence in RLL   IMPRESSION: Prolonged ventilator dependent respiratory failure Aspiration pneumonia - now fully treated Hemorrhagic shock, resolved AKI, nonoliguric.  Cr cannot be tracked due to very high bilirubin Metabolic acidosis, now mild Persistent hypokalemia, resolved Hypernatremia Severe alcoholic cirrhosis Acute hepatic failure due to alcoholic hepatitis UGI bleed due to portal gastropathy Acute blood loss anemia - no overt bleeding presently Moderate thrombocytopenia Coagulopathy due to liver failure Low-grade fever, leukocytosis, mildly elevated PCT Possible RLL HCAP Pyuria, funguria Severe hepatic encephalopathy Severe hyperammonemia  PLAN/REC: Cont vent support - settings reviewed and/or adjusted Continue PSV mode as tolerated Cont vent bundle Daily SBT if/when meets criteria ICU hemodynamic monitoring.  MAP goal > 65 mmHg Monitor BMET intermittently Monitor I/Os Correct electrolytes as indicated Cont enteral bicarbonate D5W 08/19 Continue TF protocol SUP: Enteral pantoprazole Monitor LFTs intermittently Monitor temp, WBC count Micro and abx as above DVT px:  SCDs Monitor CBC intermittently Transfuse per usual guidelines Monitor coags intermittently Continue daily enteral vitamin K - initiated 8/15 Continue lactulose and rifaximin - initiated 08/14 Continue to monitor neurologic status off of all sedative medications  -Updated pts husband regarding worsening lab values secondary to pts severe alcoholic cirrhosis. I also informed him although Mrs. Golubski is opening her eyes with verbal stimulation she is still not following commands or withdrawing from painful stimulation. Her overall prognosis is poor and she has not shown significant improvement despite aggressive treatment for several days. He states he will talk to his daughter and sons today regarding goals of treatment and all questions were answered.  Marda Stalker, [place] Pager [phone] (please enter 7 digits) PCCM Consult Pager [phone] (please enter 7 digits)

## 2018-09-17 NOTE — Progress Notes (Signed)
Updated pts daughter regarding worsening am lab values.  I also informed her pt continues to open her eyes with loud verbal stimulation, however she still is unable to follow commands or withdraw from painful stimulation. She states family will arrive at bedside the evening of 09/18/2018 to transition pt to comfort measures only.  Marda Stalker, [place] Pager [phone] (please enter 7 digits) PCCM Consult Pager [phone] (please enter 7 digits)

## 2018-09-17 NOTE — Progress Notes (Signed)
Patient receiving second unit of PRBCS. Patient opens eyes when name called . Corneal reflexes noted bilaterally but does not withdraw to pain. VSS.  A run of SVT noted at rate of 130s noted at 1700. No drop in blood pressure.

## 2018-09-17 NOTE — Progress Notes (Signed)
[facility] [place] NAME: Mackenzie Little    MR#:  [id number]  DATE OF BIRTH:  [birth year]  SUBJECTIVE:   Patient still off sedation but does not follow commands.  No acute changes overnight. Changed to comfort care but later she blink her eyes when family was present so family decided to continue current management.  She stays on the ventilator and her labs are worse today.  Hemoglobin was lower today. REVIEW OF SYSTEMS:  ROS  On mechanical ventilator  DRUG ALLERGIES:  No Known Allergies  VITALS:  Blood pressure 110/61, pulse 85, temperature 98.4 F (36.9 C), resp. rate 18, height 5' 5.98" (1.676 m), weight 101.5 kg, SpO2 98 %.  PHYSICAL EXAMINATION:   Physical Exam  GENERAL:  53 year-old patient lying in the bed on ventilator EYES: Pupils 3 mm barely reactive. Scleral icteric HEENT: Head atraumatic, normocephalic. Oropharynx endotracheal tube noted NECK:  Supple, no jugular venous distention. No thyroid enlargement, no tenderness.  LUNGS: no rhonchii this am  cARDIOVASCULAR: S1, S2 normal. No murmurs, rubs, or gallops.  ABDOMEN: Soft, nontender, nondistended. Bowel sounds present. No organomegaly or mass.  EXTREMITIES: No cyanosis, clubbing or edema b/l.    NEUROLOGIC: Patient with downward gaze.  Pupils are 3 mm sluggish.  Does not respond to deep sternal rub.  Patient is intubated.  She opens eyes to loud voice but does not follow the movements or does not follow any commands. PSYCHIATRIC: The patient is sedated on vent SKIN: No obvious rash, lesion, or ulcer.  JAUNDICE LABORATORY PANEL:   CBC Recent Labs  Lab 09/17/18 0423  WBC 15.4*  HGB 5.4*  HCT 16.6*  PLT 75*   ------------------------------------------------------------------------------------------------------------------ Chemistries  Recent Labs  Lab 09/14/18 0423  09/17/18 0423  NA 142   < > 135  K 3.0*   < > 3.9  CL 117*   < > 112*  CO2 19*   < >  17*  GLUCOSE 342*   < > 325*  BUN 50*   < > 64*  CREATININE UNABLE TO REPORT DUE TO ICTERUS   < > UNABLE TO REPORT DUE TO ICTERUS  CALCIUM 7.9*   < > 7.3*  MG 1.9  --   --   AST 124*   < > 96*  ALT UNABLE TO REPORT DUE TO ICTERUS   < > UNABLE TO REPORT DUE TO ICTERUS  ALKPHOS 126   < > 87  BILITOT 26.5*   < > 22.4*   < > = values in this interval not displayed.   ------------------------------------------------------------------------------------------------------------------  Cardiac Enzymes No results for input(s): TROPONINI in the last 168 hours. ------------------------------------------------------------------------------------------------------------------  RADIOLOGY:  No results found.   ASSESSMENT AND PLAN:   53 year old female patientwith a known history ofhypertension, alcohol abuse presented to the emergency room for generalized weakness.Patient has been fatigued for the last 3 days and noticed dark tarry stool for the last couple of days.   Patient was tired and fatigued and had labored breathing this morning and has been electively intubated and put on ventilator by intensivist.  -Hypovolemic shock and multiorgan failure -due to active GI bleed -  Initial labs with evidence of acute blood loss (Hgb 6.5), and elevated INR, elevated troponins, and evidence of AKI (Cr 2.47, GFR 25) - on vasopressors to maintain MAP >65. Status post 2U PRBC and FFP   -Hemoglobin dropped again, decided to give transfusion by ICU team.  -Acute respiratory failure with hypoxia  Mechanical ventilator as per intensivist. -Terminal extubation was canceled yesterday as patient had some eye movements as per family members.  -Multilobar pneumonia  Continue cefepime -Acute gastrointestinal bleeding with acute blood loss anemia EGD shows with LA grade C esophagitis with no bleeding and severe portal hypertensive gastropathy.  No endoscopic evidence of varices.  1 nonbleeding cratered duodenal  ulcer with no stigmata of bleeding. Recommendation for IV PPI She is status post 2 unit PRBC Hemoglobin 6.3 Continue to monitor. Need to discuss with family again about transition to comfort care.   -Abnormal liver function tests Due to hypovolemic shock   -Acute kidney injury:  Due to ATN from hypovolemic shock Avoid nephrotoxic medications.  -Elevated troponins : Due to demand ischemia from hypovolemic shock  -Acute hypokalemia: Replete PRN  -Acute metabolic/hepatic encephalopathy: severe diffuse encephalopathy, secondary to metabolic etiology. No seizures or definite epileptiform discharges were seen throughout the recording. Cont lactulose/rifaimin Cont thiamine  -Yeast UTI: on diflucan   -Hypernatremia: Need to adjust fluids and NG tube feedings   Overall prognosis very poor.  Patient with multiorgan failure Family had again change the decision about transition to comfort care yesterday, they are in talk with intensivist make the decision again.  CODE STATUS: DNR DVT Prophylaxis: SCDs  TOTAL TIME TAKING CARE OF THIS PATIENT: 24 minutes.   POSSIBLE D/C IN?? , DEPENDING ON CLINICAL CONDITION.  Vaughan Basta M.D on 09/17/2018 at 6:03 PM  Between 7am to 6pm - Pager - [phone]  After 6pm go to [email or web address] - password [email or web address] [facility]  Office  [phone]  CC: Primary care physician; Ranae Plumber, PA  Note: This dictation was prepared with Dragon dictation along with smaller phrase technology. Any transcriptional errors that result from this process are unintentional.

## 2018-09-18 DIAGNOSIS — K3189 Other diseases of stomach and duodenum: Secondary | ICD-10-CM

## 2018-09-18 DIAGNOSIS — K766 Portal hypertension: Secondary | ICD-10-CM

## 2018-09-18 LAB — PROTIME-INR
INR: 1.8 — ABNORMAL HIGH (ref 0.8–1.2)
Prothrombin Time: 20.4 seconds — ABNORMAL HIGH (ref 11.4–15.2)

## 2018-09-18 LAB — CBC WITH DIFFERENTIAL/PLATELET
Abs Immature Granulocytes: 0.25 10*3/uL — ABNORMAL HIGH (ref 0.00–0.07)
Basophils Absolute: 0.1 10*3/uL (ref 0.0–0.1)
Basophils Relative: 0 %
Eosinophils Absolute: 0.1 10*3/uL (ref 0.0–0.5)
Eosinophils Relative: 0 %
HCT: 23.7 % — ABNORMAL LOW (ref 36.0–46.0)
Hemoglobin: 7.9 g/dL — ABNORMAL LOW (ref 12.0–15.0)
Immature Granulocytes: 2 %
Lymphocytes Relative: 14 %
Lymphs Abs: 2 10*3/uL (ref 0.7–4.0)
MCH: 27.8 pg (ref 26.0–34.0)
MCHC: 33.3 g/dL (ref 30.0–36.0)
MCV: 83.5 fL (ref 80.0–100.0)
Monocytes Absolute: 0.8 10*3/uL (ref 0.1–1.0)
Monocytes Relative: 6 %
Neutro Abs: 10.6 10*3/uL — ABNORMAL HIGH (ref 1.7–7.7)
Neutrophils Relative %: 78 %
Platelets: 75 10*3/uL — ABNORMAL LOW (ref 150–400)
RBC: 2.84 MIL/uL — ABNORMAL LOW (ref 3.87–5.11)
RDW: 19.7 % — ABNORMAL HIGH (ref 11.5–15.5)
WBC: 13.8 10*3/uL — ABNORMAL HIGH (ref 4.0–10.5)
nRBC: 0.9 % — ABNORMAL HIGH (ref 0.0–0.2)

## 2018-09-18 LAB — GLUCOSE, CAPILLARY
Glucose-Capillary: 116 mg/dL — ABNORMAL HIGH (ref 70–99)
Glucose-Capillary: 108 mg/dL — ABNORMAL HIGH (ref 70–99)
Glucose-Capillary: 85 mg/dL (ref 70–99)

## 2018-09-18 LAB — TYPE AND SCREEN
ABO/RH(D): O POS
Antibody Screen: NEGATIVE
Unit division: 0
Unit division: 0

## 2018-09-18 LAB — CULTURE, BLOOD (ROUTINE X 2)
Culture: NO GROWTH
Culture: NO GROWTH
Special Requests: ADEQUATE
Special Requests: ADEQUATE

## 2018-09-18 LAB — COMPREHENSIVE METABOLIC PANEL
ALT: UNDETERMINED U/L (ref 0–44)
AST: 98 U/L — ABNORMAL HIGH (ref 15–41)
Albumin: 1.5 g/dL — ABNORMAL LOW (ref 3.5–5.0)
Alkaline Phosphatase: 95 U/L (ref 38–126)
Anion gap: 7 (ref 5–15)
BUN: 77 mg/dL — ABNORMAL HIGH (ref 6–20)
CO2: 18 mmol/L — ABNORMAL LOW (ref 22–32)
Calcium: 8.2 mg/dL — ABNORMAL LOW (ref 8.9–10.3)
Chloride: 117 mmol/L — ABNORMAL HIGH (ref 98–111)
Creatinine, Ser: UNDETERMINED mg/dL (ref 0.44–1.00)
Glucose, Bld: 123 mg/dL — ABNORMAL HIGH (ref 70–99)
Potassium: 4.6 mmol/L (ref 3.5–5.1)
Sodium: 142 mmol/L (ref 135–145)
Total Bilirubin: 24.1 mg/dL (ref 0.3–1.2)
Total Protein: 7.4 g/dL (ref 6.5–8.1)

## 2018-09-18 LAB — BPAM RBC
Blood Product Expiration Date: 202009172359
Blood Product Expiration Date: 202009172359
ISSUE DATE / TIME: 202008211031
ISSUE DATE / TIME: 202008211703
Unit Type and Rh: 5100
Unit Type and Rh: 5100

## 2018-09-18 LAB — MAGNESIUM: Magnesium: 2 mg/dL (ref 1.7–2.4)

## 2018-09-18 MED ORDER — BACITRACIN ZINC 500 UNIT/GM EX OINT
TOPICAL_OINTMENT | Freq: Three times a day (TID) | CUTANEOUS | Status: DC
  Administered 2018-09-18 (×4): 1 via TOPICAL
  Filled 2018-09-18 (×4): qty 0.9

## 2018-09-18 MED ORDER — VITAL AF 1.2 CAL PO LIQD: 1000.0000 mL | ORAL | Status: DC

## 2018-09-18 MED ORDER — LORAZEPAM 2 MG/ML IJ SOLN
1.0000 mg | INTRAMUSCULAR | Status: DC | PRN
  Administered 2018-09-18 – 2018-09-21 (×4): 1 mg via INTRAVENOUS
  Filled 2018-09-18 (×4): qty 1

## 2018-09-18 MED ORDER — VITAMINS A & D EX OINT: TOPICAL_OINTMENT | Freq: Three times a day (TID) | CUTANEOUS | Status: DC

## 2018-09-18 MED ORDER — SODIUM CHLORIDE 0.9 % IV BOLUS
1000.0000 mL | Freq: Once | INTRAVENOUS | Status: AC
  Administered 2018-09-18: 1000 mL via INTRAVENOUS

## 2018-09-18 MED ORDER — SODIUM CHLORIDE 0.9 % IV SOLN
1.0000 mg/h | INTRAVENOUS | Status: DC
  Filled 2018-09-18: qty 10

## 2018-09-18 MED ORDER — MORPHINE 100MG IN NS 100ML (1MG/ML) PREMIX INFUSION
2.0000 mg/h | INTRAVENOUS | Status: DC
  Administered 2018-09-18: 1 mg/h via INTRAVENOUS
  Administered 2018-09-21: 14:00:00 3 mg/h via INTRAVENOUS
  Filled 2018-09-18: qty 100

## 2018-09-18 MED ORDER — MORPHINE SULFATE (PF) 2 MG/ML IV SOLN
2.0000 mg | INTRAVENOUS | Status: AC | PRN
  Administered 2018-09-18: 17:00:00 2 mg via INTRAVENOUS
  Filled 2018-09-18: qty 1

## 2018-09-18 NOTE — Progress Notes (Signed)
   09/18/18 1900  Clinical Encounter Type  Visited With Family;Patient and family together  Visit Type Follow-up;Spiritual support;Critical Care;Patient actively dying  Referral From Nurse  Consult/Referral To Nurse  Spiritual Encounters  Spiritual Needs Prayer;Emotional;Grief support  Stress Factors  Family Stress Factors Exhausted;Loss;Major life changes

## 2018-09-18 NOTE — Progress Notes (Signed)
PULMONARY/CCM PROGRESS NOTE  PT PROFILE: 53 y.o. F adm 08/05 with hypovolemic shock due to melena, weakness with a history of alcoholic cirrhosis.  Other problems on admission include AKI, lactic acidosis, elevated troponin.  MAJOR EVENTS/TEST RESULTS: 08/05 gastroenterology consultation for GI bleeding 08/05 cardiology consultation for elevated troponin 08/06 abdominal ultrasound: No ascites 08/08 pelvic ultrasound: Normal 08/08 intubated for respiratory distress, suspected aspiration 08/11 echocardiogram: LVEF 50-55%.  LV cavity mildly dilated.  Moderate LVH.  Doppler parameters consistent with diastolic dysfunction.  LA moderately dilated. 08/13 palliative care consultation 08/14 EGD: Distal esophagitis without active bleeding.  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  No endoscopic evidence of varices in the entire examined stomach. 08/15 CT head: normal 08/16 Neurology consultation: Suspect severe TME. Prognosis deemed to likely be poor 08/17 Remains comatose off of all sedation 08/17 EEG: severe diffuse encephalopathy, likely secondary to metabolic etiology. No seizures or definite epileptiform discharges  08/18 no change.  Remains comatose 08/19 Fever. Otherwise, no changes. Remains comatose 08/20-Pts family arrived at bedside around 1700 to proceed with terminal extubation, however  pt opened her eyes with loud verbal stimulation from family.  Therefore, family unable to proceed with terminal extubation and needed more time to discuss goals of treatment.   08/21-Lab values worse: albumin 1.4, hgb 5.4, hct 16.6, and platelet count 75.  Pt to receive 2 units pRBC's.  Pt opening her eyes when stimulated, however not following commands 08/21- subacute ischemic infarct L internal capsule,chronic subcortical L temporal infarct  INDWELLING DEVICES: R femoral CVL 08/05 >> 08/13 ETT 08/08 >>  RUE PICC 08/13 >>   MICRO DATA: SARS-CoV-2 PCR 8/5  >>negative MRSA PCR 8/5>> negative Resp 8/9 >> MSSA Blood 8/5 >> negative Blood 8/9 >>negative Urine 08/17 >> greater than 100k colonies/mL yeast Resp 08/17 >>negative  Blood 08/17 >>negative  PCT 08/17: 2.25, 1.81, 1.57   ANTIMICROBIALS:  Vancomycin 8/5 >> 8/6 Cefepime 8/5 >> 8/11 Metronidazole 8/6 >> 8/11 Ampicillin-sulbactam 8/11 >> 08/15  Vancomycin 08/17 >> 08/18 Cefepime 08/17 >>  Fluconazole 08/18 >>   SUBJ: Remains intubated.  Remains comatose, on no sedatives.  Family has gathered and have decided to transition patient to comfort care.   OBJ: Vitals:   09/18/18 0750 09/18/18 1200 09/18/18 1600 09/18/18 1613  BP: (!) 91/55 (!) 93/56 (!) 112/59   Pulse: 79 79 80   Resp: _0 Temp: 98.2 F (36.8 C) 98.1 F (36.7 C)    TempSrc: Axillary Axillary    SpO2: 99% 99% 99% 98%  Weight:      Height:       Vent Mode: PRVC FiO2 (%):  [28 %] 28 % Set Rate:  [16 bmp] 16 bmp Vt Set:  [500 mL] 500 mL PEEP:  [5 cmH20] 5 cmH20 Plateau Pressure:  [17 cmH20-23 cmH20] 19 cmH20   Gen: Intubated, comatose, spontaneous opening of eyes with no tracking or purpose HEENT: NCAT, severe scleral icterus, scleral edema Neck: No JVD noted Lungs: Few rhonchi, no wheezes, not tachypneic, synchronous with the ventilator Cardiovascular: Regular, no M Abdomen: Distended, soft, + BS Ext: Symmetric BUE and BLE edema Neuro: opening eyes randomly necessarily to verbal stimulation,not following commands or withdrawing from pain stimulation, pupils equal and react Skin: No lesions noted  BMP Latest Ref Rng & Units 09/18/2018 09/17/2018 09/15/2018  Glucose 70 - 99 mg/dL 123(H) 325(H) 133(H)  BUN 6 - 20 mg/dL 77(H) 64(H) 53(H)  Creatinine 0.44 - 1.00 mg/dL UNABLE TO  REPORT DUE TO ICTERUS UNABLE TO REPORT DUE TO ICTERUS UNABLE TO REPORT DUE TO ICTERUS  Sodium 135 - 145 mmol/L 142 135 153(H)  Potassium 3.5 - 5.1 mmol/L 4.6 3.9 3.7  Chloride 98 - 111 mmol/L 117(H) 112(H) 128(H)  CO2 22 - 32  mmol/L 18(L) 17(L) 20(L)  Calcium 8.9 - 10.3 mg/dL 8.2(L) 7.3(L) 8.0(L)    Hepatic Function Latest Ref Rng & Units 09/18/2018 09/17/2018 09/15/2018  Total Protein 6.5 - 8.1 g/dL 7.4 7.0 7.0  Albumin 3.5 - 5.0 g/dL 1.5(L) 1.4(L) 1.6(L)  AST 15 - 41 U/L 98(H) 96(H) 114(H)  ALT 0 - 44 U/L UNABLE TO REPORT DUE TO ICTERUS UNABLE TO REPORT DUE TO ICTERUS UNABLE TO REPORT DUE TO ICTERUS  Alk Phosphatase 38 - 126 U/L 95 87 106  Total Bilirubin 0.3 - 1.2 mg/dL 24.1(HH) 22.4(HH) 25.7(HH)  Bilirubin, Direct 0.0 - 0.2 mg/dL - - -    CBC Latest Ref Rng & Units 09/18/2018 09/17/2018 09/17/2018  WBC 4.0 - 10.5 K/uL 13.8(H) 14.9(H) 15.4(H)  Hemoglobin 12.0 - 15.0 g/dL 7.9(L) 8.0(L) 5.4(L)  Hematocrit 36.0 - 46.0 % 23.7(L) 24.3(L) 16.6(L)  Platelets 150 - 400 K/uL 75(L) 75(L) 75(L)    ABG    Component Value Date/Time   PHART 7.47 (H) 09/14/2018 2300   PCO2ART 29 (L) 09/14/2018 2300   PO2ART 86 09/14/2018 2300   HCO3 21.1 09/14/2018 2300   ACIDBASEDEF 1.5 09/14/2018 2300   O2SAT 97.1 09/14/2018 2300   Ammonia 120, 83, 71  CXR: No new film  IMPRESSION: Prolonged ventilator dependent respiratory failure Aspiration pneumonia - now fully treated Hemorrhagic shock, resolved AKI, nonoliguric.  Cr cannot be tracked due to very high bilirubin Metabolic acidosis, now mild Persistent hypokalemia, resolved Hypernatremia Severe alcoholic cirrhosis Acute hepatic failure due to alcoholic hepatitis UGI bleed due to portal gastropathy Acute blood loss anemia - no overt bleeding presently Moderate thrombocytopenia due to portal hypertension with splenic sequestration Coagulopathy due to liver failure Low-grade fever, leukocytosis, mildly elevated PCT Systemic inflammatory response syndrome Possible RLL HCAP Pyuria, funguria Severe hepatic encephalopathy Severe hyperammonemia Subacute/chronic CVAs   PLAN/REC: Discontinuing vent support per family request and transitioning to comfort care   Discontinue ICU hemodynamic monitoring. Morphine infusion for comfort Ativan as necessary for anxiety  Patient is being transitioned to comfort care per family's wishes.  She has failed to show improvement in 17 days of aggressive supportive care.  She has acute on chronic hepatic failure which at this point is irreversible.  Patient is DNR/DNI see orders for details.      Renold Don, MD [facility]

## 2018-09-18 NOTE — Progress Notes (Signed)
Feeding rate slowed to 12mL/hr.  Her lung sounds include rhonchi and inspiratory wheezes.  Notified MD and she said lowering feed rate was good.    Lips were very dry, used lip gel and was able to remove several pieces of dead skin.  Applied additional lip gel.  Phillis Knack, RN

## 2018-09-18 NOTE — Progress Notes (Signed)
Pt extubated to RA per MD order, Family at bedside

## 2018-09-18 NOTE — Progress Notes (Signed)
Mackenzie Little NAME: Mackenzie Little    MR#:  [id number]  DATE OF BIRTH:  November 17, 1965  SUBJECTIVE:   Patient still off sedation but does not follow commands.  No acute changes overnight. Changed to comfort care but later she blink her eyes when family was present so family decided to continue current management.  She stays on the ventilator, Bilirubin is higher.  REVIEW OF SYSTEMS:  ROS  On mechanical ventilator  DRUG ALLERGIES:  No Known Allergies  VITALS:  Blood pressure (!) 93/56, pulse 79, temperature 98.1 F (36.7 C), temperature source Axillary, resp. rate 17, height 5' 5.98" (1.676 m), weight 101 kg, SpO2 99 %.  PHYSICAL EXAMINATION:   Physical Exam  GENERAL:  53 y.o.-year-old patient lying in the bed on ventilator EYES: Pupils 3 mm barely reactive. Scleral icteric HEENT: Head atraumatic, normocephalic. Oropharynx endotracheal tube noted NECK:  Supple, no jugular venous distention. No thyroid enlargement, no tenderness.  LUNGS: no rhonchii this am  cARDIOVASCULAR: S1, S2 normal. No murmurs, rubs, or gallops.  ABDOMEN: Soft, nontender, nondistended. Bowel sounds present. No organomegaly or mass.  EXTREMITIES: No cyanosis, clubbing or edema b/l.    NEUROLOGIC: Patient with downward gaze.  Pupils are 3 mm sluggish.  Does not respond to deep sternal rub.  Patient is intubated.  She opens eyes to loud voice but does not follow the movements or does not follow any commands. PSYCHIATRIC: The patient is on vent SKIN: No obvious rash, lesion, or ulcer.  JA[facility]DICE LABORATORY PANEL:   CBC Recent Labs  Lab 09/18/18 0417  WBC 13.8*  HGB 7.9*  HCT 23.7*  PLT 75*   ------------------------------------------------------------------------------------------------------------------ Chemistries  Recent Labs  Lab 09/18/18 0417  NA 142  K 4.6  CL 117*  CO2 18*  GLUCOSE 123*  BUN 77*  CREATININE UNABLE TO REPORT DUE TO ICTERUS   CALCIUM 8.2*  MG 2.0  AST 98*  ALT UNABLE TO REPORT DUE TO ICTERUS  ALKPHOS 95  BILITOT 24.1*   ------------------------------------------------------------------------------------------------------------------  Cardiac Enzymes No results for input(s): TROPONINI in the last 168 hours. ------------------------------------------------------------------------------------------------------------------  RADIOLOGY:  Mr Brain Wo Contrast  Result Date: 09/17/2018 CLINICAL DATA:  Initial evaluation for acute encephalopathy, altered mental status, unexplained. EXAM: MRI HEAD WITHOUT CONTRAST TECHNIQUE: Multiplanar, multiecho pulse sequences of the brain and surrounding structures were obtained without intravenous contrast. COMPARISON:  Prior head CT from 09/11/2018. FINDINGS: Brain: Cerebral volume within normal limits for age. No significant cerebral white matter disease. Focus of encephalomalacia and gliosis within the mid left temporal region most likely reflects a small chronic ischemic infarct (series 13, image 14). Small amount of associated chronic hemosiderin staining. Patchy 8 mm focus of restricted diffusion seen involving the genu of the left internal capsule, early subacute in appearance. No associated hemorrhage or mass effect. No other evidence for acute or subacute ischemia. Gray-white matter differentiation otherwise maintained with no other evidence for chronic cortical infarction. No other evidence for acute or chronic intracranial hemorrhage. No mass lesion, midline shift or mass effect. Ventricles normal size without hydrocephalus. No extra-axial fluid collection. Vascular: Major intracranial vascular flow voids maintained. Skull and upper cervical spine: Craniocervical junction within normal limits. Bone marrow signal intensity normal. No scalp soft tissue abnormality. Sinuses/Orbits: Globes and orbital soft tissues within normal limits. Mild scattered mucosal thickening within the  maxillary sinuses bilaterally. Paranasal sinuses are otherwise clear. No mastoid effusion. Inner ear structures grossly normal. Other: None. IMPRESSION: 1. Patchy 8  mm early subacute ischemic infarct involving the left internal capsule. No associated hemorrhage. 2. Small chronic subcortical left temporal infarct as above. 3. Otherwise negative brain MRI for age. Electronically Signed   By: Jeannine Boga M.D.   On: 09/17/2018 22:56     ASSESSMENT AND PLAN:   53 year old female patientwith a known history ofhypertension, alcohol abuse presented to the emergency room for generalized weakness.Patient has been fatigued for the last 3 days and noticed dark tarry stool for the last couple of days.   Patient was tired and fatigued and had labored breathing and has been electively intubated and put on ventilator by intensivist.  -Hypovolemic shock and multiorgan failure -due to active GI bleed -  Initial labs with evidence of acute blood loss (Hgb 6.5), and elevated INR, elevated troponins, and evidence of AKI (Cr 2.47, GFR 25) - on vasopressors to maintain MAP >65. Status post 2U PRBC and FFP   -Hemoglobin dropped again, decided to give transfusion by ICU team. Monitor closely.  -Acute respiratory failure with hypoxia Mechanical ventilator as per intensivist. -Terminal extubation was canceled yesterday as patient had some eye movements as per family members.  -Multilobar pneumonia  Continue cefepime -Acute gastrointestinal bleeding with acute blood loss anemia EGD shows with LA grade C esophagitis with no bleeding and severe portal hypertensive gastropathy.  No endoscopic evidence of varices.  1 nonbleeding cratered duodenal ulcer with no stigmata of bleeding. Recommendation for IV PPI She is status post 2 unit PRBC Hemoglobin 6.3 Continue to monitor. Need to discuss with family again about transition to comfort care.   -Abnormal liver function tests Due to hypovolemic  shock   -Acute kidney injury:  Due to ATN from hypovolemic shock Avoid nephrotoxic medications.  -Elevated troponins : Due to demand ischemia from hypovolemic shock  -Acute hypokalemia: Replete PRN  -Acute metabolic/hepatic encephalopathy: severe diffuse encephalopathy, secondary to metabolic etiology. No seizures or definite epileptiform discharges were seen throughout the recording. Cont lactulose/rifaimin Cont thiamine  -Yeast UTI: on diflucan   -Hypernatremia: Need to adjust fluids and NG tube feedings   Overall prognosis very poor.  Patient with multiorgan failure Family had again change the decision about transition to comfort care yesterday, they are in talk with intensivist make the decision again.  CODE STATUS: DNR DVT Prophylaxis: SCDs  TOTAL TIME TAKING CARE OF THIS PATIENT: 24 minutes.   POSSIBLE D/C IN?? , DEPENDING ON CLINICAL CONDITION.  Vaughan Basta M.D on 09/18/2018 at 4:15 PM  Between 7am to 6pm - Pager - [phone]  After 6pm go to [email or web address] - password [email or web address] [facility]  Office  [phone]  CC: Primary care physician; Ranae Plumber, PA  Note: This dictation was prepared with Dragon dictation along with smaller phrase technology. Any transcriptional errors that result from this process are unintentional.

## 2018-09-19 MED ORDER — GLYCOPYRROLATE 0.2 MG/ML IJ SOLN
0.2000 mg | INTRAMUSCULAR | Status: DC | PRN
  Administered 2018-09-19 – 2018-09-21 (×5): 0.2 mg via INTRAVENOUS
  Filled 2018-09-19 (×5): qty 1

## 2018-09-19 NOTE — Care Plan (Signed)
Patient is COMFORT measures only. Transferring to 1C  PCCM has signed off.   Renold Don, MD [facility]

## 2018-09-19 NOTE — Progress Notes (Signed)
Multiple phone calls from family members in reguards to patient being comfort care.  After review of the chart I contacted husband Office manager) to discuss HCPOA and plans for treatment.  Husband concerned that they did not understand comfort measures and will be here in the morning to discuss with doctor.

## 2018-09-19 NOTE — Progress Notes (Signed)
[facility] [place] NAME: Onida Houghton    MR#:  [id number]  DATE OF BIRTH:  [birth year]  SUBJECTIVE:   Patient still off sedation but does not follow commands.  No acute changes overnight. Changed to comfort care but later she blink her eyes when family was present so family decided to continue current management.  She was terminally extubated and transition to comfort care yesterday.  Remains drowsy on morphine drip not in any distress  REVIEW OF SYSTEMS:  ROS  On morphine drip and drowsy.  DRUG ALLERGIES:  No Known Allergies  VITALS:  Blood pressure (!) 112/59, pulse 76, temperature 98.1 F (36.7 C), temperature source Axillary, resp. rate (!) 28, height 5' 5.98" (1.676 m), weight 101 kg, SpO2 93 %.  PHYSICAL EXAMINATION:   Physical Exam  GENERAL:  53 y.o.-year-old patient lying in the bed . EYES: Pupils 3 mm barely reactive. Scleral icteric HEENT: Head atraumatic, normocephalic. Oropharynx endotracheal tube noted NECK:  Supple, no jugular venous distention. No thyroid enlargement, no tenderness.  LUNGS: no rhonchii this am  cARDIOVASCULAR: S1, S2 normal. No murmurs, rubs, or gallops.  ABDOMEN: Soft, nontender, nondistended. Bowel sounds present. No organomegaly or mass.  EXTREMITIES: No cyanosis, clubbing or edema b/l.    NEUROLOGIC: Patient with downward gaze.  Pupils are 3 mm sluggish.  Does not respond to deep sternal rub.  She opens eyes to loud voice but does not follow the movements or does not follow any commands. PSYCHIATRIC: The patient is drowsy. SKIN: No obvious rash, lesion, or ulcer.  JAUNDICE LABORATORY PANEL:   CBC Recent Labs  Lab 09/18/18 0417  WBC 13.8*  HGB 7.9*  HCT 23.7*  PLT 75*   ------------------------------------------------------------------------------------------------------------------ Chemistries  Recent Labs  Lab 09/18/18 0417  NA 142  K 4.6  CL 117*  CO2 18*  GLUCOSE 123*  BUN  77*  CREATININE UNABLE TO REPORT DUE TO ICTERUS  CALCIUM 8.2*  MG 2.0  AST 98*  ALT UNABLE TO REPORT DUE TO ICTERUS  ALKPHOS 95  BILITOT 24.1*   ------------------------------------------------------------------------------------------------------------------  Cardiac Enzymes No results for input(s): TROPONINI in the last 168 hours. ------------------------------------------------------------------------------------------------------------------  RADIOLOGY:  Mr Brain Wo Contrast  Result Date: 09/17/2018 CLINICAL DATA:  Initial evaluation for acute encephalopathy, altered mental status, unexplained. EXAM: MRI HEAD WITHOUT CONTRAST TECHNIQUE: Multiplanar, multiecho pulse sequences of the brain and surrounding structures were obtained without intravenous contrast. COMPARISON:  Prior head CT from 09/11/2018. FINDINGS: Brain: Cerebral volume within normal limits for age. No significant cerebral white matter disease. Focus of encephalomalacia and gliosis within the mid left temporal region most likely reflects a small chronic ischemic infarct (series 13, image 14). Small amount of associated chronic hemosiderin staining. Patchy 8 mm focus of restricted diffusion seen involving the genu of the left internal capsule, early subacute in appearance. No associated hemorrhage or mass effect. No other evidence for acute or subacute ischemia. Gray-white matter differentiation otherwise maintained with no other evidence for chronic cortical infarction. No other evidence for acute or chronic intracranial hemorrhage. No mass lesion, midline shift or mass effect. Ventricles normal size without hydrocephalus. No extra-axial fluid collection. Vascular: Major intracranial vascular flow voids maintained. Skull and upper cervical spine: Craniocervical junction within normal limits. Bone marrow signal intensity normal. No scalp soft tissue abnormality. Sinuses/Orbits: Globes and orbital soft tissues within normal limits.  Mild scattered mucosal thickening within the maxillary sinuses bilaterally. Paranasal sinuses are otherwise clear. No mastoid effusion. Inner ear  structures grossly normal. Other: None. IMPRESSION: 1. Patchy 8 mm early subacute ischemic infarct involving the left internal capsule. No associated hemorrhage. 2. Small chronic subcortical left temporal infarct as above. 3. Otherwise negative brain MRI for age. Electronically Signed   By: Jeannine Boga M.D.   On: 09/17/2018 22:56     ASSESSMENT AND PLAN:   53 year old female patientwith a known history ofhypertension, alcohol abuse presented to the emergency room for generalized weakness.Patient has been fatigued for the last 3 days and noticed dark tarry stool for the last couple of days.   Patient was tired and fatigued and had labored breathing and has been electively intubated and put on ventilator by intensivist.  -Hypovolemic shock and multiorgan failure -due to active GI bleed -  Initial labs with evidence of acute blood loss (Hgb 6.5), and elevated INR, elevated troponins, and evidence of AKI (Cr 2.47, GFR 25) - on vasopressors to maintain MAP >65. Status post 2U PRBC and FFP   -Hemoglobin dropped again, decided to give transfusion by ICU team. Monitor closely.  -Acute respiratory failure with hypoxia Mechanical ventilator as per intensivist. -Terminal extubation and comfort care now. -Multilobar pneumonia  Continue cefepime -Acute gastrointestinal bleeding with acute blood loss anemia EGD shows with LA grade C esophagitis with no bleeding and severe portal hypertensive gastropathy.  No endoscopic evidence of varices.  1 nonbleeding cratered duodenal ulcer with no stigmata of bleeding. Recommendation for IV PPI She is status post 2 unit PRBC Hemoglobin 6.3 Continue to monitor. Now on comfort care.  -Abnormal liver function tests Due to hypovolemic shock   -Acute kidney injury:  Due to ATN from hypovolemic shock Avoid  nephrotoxic medications.  -Elevated troponins : Due to demand ischemia from hypovolemic shock  -Acute hypokalemia: Replete PRN  -Acute metabolic/hepatic encephalopathy: severe diffuse encephalopathy, secondary to metabolic etiology. No seizures or definite epileptiform discharges were seen throughout the recording. Was on lactulose/rifaimin Was on thiamine Now comfort care - morphine drip  -Yeast UTI: on diflucan   -Hypernatremia: Need to adjust fluids and NG tube feedings   Overall prognosis very poor.  Patient with multiorgan failure She is comfort care now.  CODE STATUS: DNR DVT Prophylaxis: SCDs  TOTAL TIME TAKING CARE OF THIS PATIENT: 24 minutes.   POSSIBLE D/C IN?? , DEPENDING ON CLINICAL CONDITION.  Vaughan Basta M.D on 09/19/2018 at 12:35 PM  Between 7am to 6pm - Pager - [phone]  After 6pm go to [email or web address] - password [email or web address] [facility]  Office  [phone]  CC: Primary care physician; Ranae Plumber, PA  Note: This dictation was prepared with Dragon dictation along with smaller phrase technology. Any transcriptional errors that result from this process are unintentional.

## 2018-09-19 NOTE — Progress Notes (Addendum)
Yesterday RN assisted RT while she extubated the patient.  Family was present at bedside (daughter, 2 sons, and daughter in law).  Family stayed for a while and then went outside.    Patient was in the 90%s for O2 on room air. MD ordered a morphine push and then a morphine drip.    Family spoke with RN last night as she was walking to her car.  They were thankful for her care and very pleasant.    Today, the Patient sounds gurgly, RN is suctioning her mouth frequently.  Patient tends to lean her head to the right, so RN has positioned pillows and support to help her keep her neck straight to facilitate breathing.  Patient has a protective foam on her ear.  RN continues to reposition patient.  Patient does not appear to be in pain, she is unresponsive.  Patient's morphine drip is still at 32mL per hour.  RN is applying lip gel Q1-2 and medicated lip ointment 3xs daily.  Patient's lips are improving.  They were very dry with several cracks that had been bleeding.Wilburn Mylar several large pieces of skin sloughed off. RN is keeping a washcloth under the patient's chin to catch drool and changing it regularly.  Even with regular suctioning, the patient drools. She had been drooling before extubation so this is not a change.     Night shift combed and styled her hair in very nice braids.    Phillis Knack, RN

## 2018-09-20 MED ORDER — SODIUM CHLORIDE 0.9 % IV SOLN
INTRAVENOUS | Status: DC
  Administered 2018-09-20: 13:00:00 via INTRAVENOUS

## 2018-09-20 MED ORDER — SODIUM CHLORIDE 0.9 % IV SOLN: INTRAVENOUS | Status: DC

## 2018-09-20 NOTE — Progress Notes (Signed)
Mouth care done multiple times, blood coming from patient's mouth with clots as well. Clarise Cruz, BSN

## 2018-09-20 NOTE — Progress Notes (Signed)
Mackenzie Little at [place] NAME: Mackenzie Little    MR#:  [id number]  DATE OF BIRTH:  May 03, 1965  SUBJECTIVE:   No acute changes overnight.  She was terminally extubated and transition to comfort care yesterday.  Remains drowsy on morphine drip not in any distress. Pt's husband, son and daughter in law in room.  They had questions regarding the care I talked to them in detail and answered that.  REVIEW OF SYSTEMS:  ROS  On morphine drip and drowsy.  DRUG ALLERGIES:  No Known Allergies  VITALS:  Blood pressure 111/65, pulse 71, temperature (!) 97.5 F (36.4 C), temperature source Oral, resp. rate 12, height 5' 5.98" (1.676 m), weight 101 kg, SpO2 95 %.  PHYSICAL EXAMINATION:   Physical Exam  GENERAL:  53 y.o.-year-old patient lying in the bed . EYES: Pupils 3 mm barely reactive. Scleral icteric HEENT: Head atraumatic, normocephalic. Oropharynx endotracheal tube noted NECK:  Supple, no jugular venous distention. No thyroid enlargement, no tenderness.  LUNGS: no rhonchii this am  cARDIOVASCULAR: S1, S2 normal. No murmurs, rubs, or gallops.  ABDOMEN: Soft, nontender, nondistended. Bowel sounds present. No organomegaly or mass.  EXTREMITIES: No cyanosis, clubbing or edema b/l.    NEUROLOGIC: Patient with downward gaze.  Pupils are 3 mm sluggish.  Does not respond to deep sternal rub.  She opens eyes to loud voice but does not follow the movements or does not follow any commands. PSYCHIATRIC: The patient is drowsy. SKIN: No obvious rash, lesion, or ulcer.  JAUNDICE  LABORATORY PANEL:   CBC Recent Labs  Lab 09/18/18 0417  WBC 13.8*  HGB 7.9*  HCT 23.7*  PLT 75*   ------------------------------------------------------------------------------------------------------------------ Chemistries  Recent Labs  Lab 09/18/18 0417  NA 142  K 4.6  CL 117*  CO2 18*  GLUCOSE 123*  BUN 77*  CREATININE UNABLE TO REPORT DUE TO ICTERUS   CALCIUM 8.2*  MG 2.0  AST 98*  ALT UNABLE TO REPORT DUE TO ICTERUS  ALKPHOS 95  BILITOT 24.1*   ------------------------------------------------------------------------------------------------------------------  Cardiac Enzymes No results for input(s): TROPONINI in the last 168 hours. ------------------------------------------------------------------------------------------------------------------  RADIOLOGY:  No results found.   ASSESSMENT AND PLAN:   53 year old female patientwith a known history ofhypertension, alcohol abuse presented to the emergency room for generalized weakness.Patient has been fatigued for the last 3 days and noticed dark tarry stool for the last couple of days.   Patient was tired and fatigued and had labored breathing and has been electively intubated and put on ventilator by intensivist.  -Hypovolemic shock and multiorgan failure -due to active GI bleed -  Initial labs with evidence of acute blood loss (Hgb 6.5), and elevated INR, elevated troponins, and evidence of AKI (Cr 2.47, GFR 25) - on vasopressors to maintain MAP >65. Status post 2U PRBC and FFP   -Patient is on comfort care now.  -Acute respiratory failure with hypoxia Mechanical ventilator as per intensivist. -Terminal extubation and comfort care now. -Multilobar pneumonia  Continue cefepime -Acute gastrointestinal bleeding with acute blood loss anemia EGD shows with LA grade C esophagitis with no bleeding and severe portal hypertensive gastropathy.  No endoscopic evidence of varices.  1 nonbleeding cratered duodenal ulcer with no stigmata of bleeding. Recommendation for IV PPI She is status post 2 unit PRBC Hemoglobin 6.3 Continue to monitor. Now on comfort care.  She continued to have spontaneous respiration.  Does not look in any distress.  -Abnormal liver function tests Due to  hypovolemic shock She had a history of alcoholic liver cirrhosis.  -Acute kidney injury:  Due to ATN  from hypovolemic shock Avoid nephrotoxic medications.  -Elevated troponins : Due to demand ischemia from hypovolemic shock  -Acute hypokalemia:   -Acute metabolic/hepatic encephalopathy: severe diffuse encephalopathy, secondary to metabolic etiology. No seizures or definite epileptiform discharges were seen throughout the recording. Was on lactulose/rifaimin Was on thiamine Now comfort care - morphine drip  not any purposeful brain activities.  -Yeast UTI: on diflucan   -Hypernatremia: Need to adjust fluids and NG tube feedings   Overall prognosis very poor.  Patient with multiorgan failure She is comfort care now. Family had questions regarding her not getting IV fluids or any nutrition.  They were told in ICU that- " she most likely would not survive once extubated off the ventilator" and they are upset that she is now breathing fine on her own. I explained them in detail that she still have multiorgan failure, not purposeful brain activities, and this may her survival very unlikely.  I again explained the concept of comfort care, and answered their multiple questions also including transfer to tertiary care center. After explanation they were satisfied but they requested to start on IV fluids as they could not see her not getting any nutrition or hydration for days like this.  CODE STATUS: DNR DVT Prophylaxis: SCDs  TOTAL TIME TAKING CARE OF THIS PATIENT: 40 minutes.   POSSIBLE D/C IN?? , DEPENDING ON CLINICAL CONDITION.  Mackenzie Little M.D on 09/20/2018 at 1:20 PM  Between 7am to 6pm - Pager - [phone]  After 6pm go to [email or web address] - password [email or web address] [facility]  Office  [phone]  CC: Primary care physician; Ranae Plumber, PA  Note: This dictation was prepared with Dragon dictation along with smaller phrase technology. Any transcriptional errors that result from this process are unintentional.

## 2018-09-20 NOTE — Progress Notes (Signed)
Nutrition Brief Follow-Up Note  Chart reviewed. Patient has transitioned to comfort care.   No further nutrition interventions warranted at this time. Please re-consult RD as needed.   Konstantine Gervasi, MS, RD, LDN Office: [phone] Pager: [phone] After Hours/Weekend Pager: [phone]  

## 2018-09-20 NOTE — Progress Notes (Signed)
Ch visited pt and family as a f/u. Pt was not responsive yet was breathing on her own post-extubation. Ch met with family at bedside that shared their concerns regarding the pt being placed on cc measures. Family seemed to be upset about how cc was communicated to them. Ch understood the family's frustration and the questions that they wanted to gain clarity on. Ch provided grief support with family present. Pt's husband shared his frustration but also begun to move towards a place of acceptance once he was educated on the condition of his wife health and that her ability to recover did not seem foreseeable. Ch provided words of comfort and offered an apology for the miscommunication. No further needs at this time.    09/20/18 1400  Clinical Encounter Type  Visited With Patient and family together;Health care provider  Visit Type Follow-up;Spiritual support;Social support;Patient actively dying  Spiritual Encounters  Spiritual Needs Emotional;Grief support  Stress Factors  Patient Stress Factors Major life changes;Health changes  Family Stress Factors Loss;Loss of control;Major life changes;Exhausted;Lack of knowledge

## 2018-09-20 NOTE — Progress Notes (Signed)
Family Meeting Note  Advance Directive:yes  Today a meeting took place with the spouse and son, daughter in law.  Patient is unable to participate due [id number] capacity Comatose   The following clinical team members were present during this meeting:MD  The following were discussed:Patient's diagnosis: Multiorgan failure, Alcoholic Liver cirrhosis, Patient's progosis: < 4 weeks and Goals for treatment: DNR  Overall prognosis very poor.  Patient with multiorgan failure She is comfort care now. Family had questions regarding her not getting IV fluids or any nutrition.  They were told in ICU that- " she most likely would not survive once extubated off the ventilator" and they are upset that she is now breathing fine on her own. I explained them in detail that she still have multiorgan failure, not purposeful brain activities, and this may her survival very unlikely.  I again explained the concept of comfort care, and answered their multiple questions also including transfer to tertiary care center. After explanation they were satisfied but they requested to start on IV fluids as they could not see her not getting any nutrition or hydration for days like this.   Additional follow-up to be provided: Palliative care.  Time spent during discussion:20 minutes  Vaughan Basta, MD

## 2018-09-20 NOTE — Progress Notes (Signed)
Made Dr. Anselm Jungling aware that multiple family members are here and asking to speak with him.  He stated he will come by to see family.  Clarise Cruz, BSN

## 2018-09-21 DIAGNOSIS — Z515 Encounter for palliative care: Secondary | ICD-10-CM

## 2018-09-21 DIAGNOSIS — Z7189 Other specified counseling: Secondary | ICD-10-CM

## 2018-09-21 LAB — TROPONIN I (HIGH SENSITIVITY)
Troponin I (High Sensitivity): 454 ng/L (ref <18)
Troponin I (High Sensitivity): 524 ng/L (ref <18)

## 2018-09-21 MED ORDER — ARTIFICIAL TEARS OPHTHALMIC OINT
1.0000 "application " | TOPICAL_OINTMENT | OPHTHALMIC | Status: DC | PRN
  Administered 2018-09-21 (×2): 1 via OPHTHALMIC
  Filled 2018-09-21 (×3): qty 1

## 2018-09-28 NOTE — Progress Notes (Signed)
Palliative:  Mackenzie Little is lying quietly in bed.  She appears to be actively dying, but is comfortable.  She has morphine continuous infusion to help with end-of-life care.  She does not respond to my voice or touch, she is unable to keep her eyes fully closed.  There is no family at bedside at this time.  Conference with attending and bedside nursing staff related to patient condition, needs, end-of-life care.  Call to husband, Mackenzie Little at [phone]. Mackenzie Little, Mackenzie Little tells me that he and his son Mackenzie Little will be coming to the hospital soon.  We talked about the difficulties family's face when their loved one has compassionate extubation and lingers.  I reassure Mackenzie Little that comfort and dignity is a kind and loving choice for Mackenzie Little, we cannot change what is happening.  I share that I believe time is short.  We talked about IV fluids, third spacing.  Mackenzie Little shares that he does not want to do anything that would cause Mackenzie Little pain or suffering, and readily agrees to discontinue IV fluids.     Plan: Full comfort care, DC IV fluids, liberalize medicines for pain and anxiety, breathlessness.  Prognosis:  Hours to days, anticipate in hospital death.   55 minutes, extended time Mackenzie Axe, NP Palliative Medicine Team Team Phone # [phone] Greater than 50% of this time was spent counseling and coordinating care related to the above assessment and plan.

## 2018-09-28 NOTE — Progress Notes (Addendum)
Patient on comfort Care measure. Patient husband notified of patient breathing has changed this morning, patient breathing more shallow this morning.

## 2018-09-28 NOTE — Progress Notes (Signed)
   09/23/18 1900  Clinical Encounter Type  Visited With Family  Visit Type Follow-up;Spiritual support;Death  Referral From Nurse  Spiritual Encounters  Spiritual Needs Prayer;Emotional;Grief support  Stress Factors  Family Stress Factors Loss   Chaplain received a page to support the family in the wake of the patient's death. Upon arrival, the patient's daughter-in-law Yvetta Coder) and sister-in-law Magda Paganini) were at the bedside. They were understandably distraught as they received the news. The family indicated that other family members were outside the medical mall entrance and on the way to the hospital. This chaplain facilitated life review and listened as they shared some things about their relationship with the patient, her character, and her life. This chaplain provided support in the form of active and reflective listening, compassionate ministerial presence, comfort, and prayer. Chaplain met additional family members at the front to offer support them in their grief. The family decided to depart, providing necessary information to the patient's nurse. The family expressed appreciation for the care provided.

## 2018-09-28 NOTE — Progress Notes (Signed)
[place] [facility] [place] NAME: Mackenzie Little    MR#:  [id number]  DATE OF BIRTH:  Jun 26, 1965  SUBJECTIVE:   Patient is unresponsive on a morphine drip.  REVIEW OF SYSTEMS:  ROS  Unresponsive  DRUG ALLERGIES:  No Known Allergies  VITALS:  Blood pressure (!) 73/39, pulse 68, temperature 98.1 F (36.7 C), temperature source Axillary, resp. rate (!) 8, height 5' 5.98" (1.676 m), weight 101 kg, SpO2 (!) 87 %.  PHYSICAL EXAMINATION:   Physical Exam  GENERAL:  53 y.o.-year-old patient lying in the bed . EYES:  Scleral icterus HEENT: Head atraumatic, normocephalic. NECK: No masses seen LUNGS: Coarse breath sounds CARDIOVASCULAR: S1, S2  ABDOMEN: Soft, nontender, nondistended.  EXTREMITIES: Anasarca PSYCHIATRIC: The patient is unresponsive. SKIN: Icterus  LABORATORY PANEL:   CBC Recent Labs  Lab 09/18/18 0417  WBC 13.8*  HGB 7.9*  HCT 23.7*  PLT 75*   ------------------------------------------------------------------------------------------------------------------ Chemistries  Recent Labs  Lab 09/18/18 0417  NA 142  K 4.6  CL 117*  CO2 18*  GLUCOSE 123*  BUN 77*  CREATININE UNABLE TO REPORT DUE TO ICTERUS  CALCIUM 8.2*  MG 2.0  AST 98*  ALT UNABLE TO REPORT DUE TO ICTERUS  ALKPHOS 95  BILITOT 24.1*   ------------------------------------------------------------------------------------------------------------------  Cardiac Enzymes No results for input(s): TROPONINI in the last 168 hours. ------------------------------------------------------------------------------------------------------------------  RADIOLOGY:  No results found.   ASSESSMENT AND PLAN:   53 year old female patientwith a known history ofhypertension, alcohol abuse presented to the emergency room for generalized weakness.Patient has been fatigued for the last 3 days and noticed dark tarry stool for the last couple of days.   Patient was  tired and fatigued and had labored breathing and was electively intubated and put on ventilator by intensivist.  -Hypovolemic shock and multiorgan failure -due to acute GI bleed -  Initial labs with evidence of acute blood loss (Hgb 6.5), and elevated INR, elevated troponins, and evidence of AKI (Cr 2.47, GFR 25) Was treated with vasopressors, blood transfusion, FFP in the ICU..  -Acute respiratory failure with hypoxia -Multilobar pneumonia  Treated with IV antibiotics with full ventilatory support after intubation.  Cultures negative.  Due to poor prognosis patient was extubated and transitioned to comfort care in the ICU  -Acute gastrointestinal bleeding with acute blood loss anemia EGD shows with LA grade C esophagitis with no bleeding and severe portal hypertensive gastropathy.  No endoscopic evidence of varices.  1 nonbleeding cratered duodenal ulcer with no stigmata of bleeding. Treated with IV PPI, blood transfusion and FFP.  Continue to worsen  -Shock liver over alcoholic liver cirrhosis Due to hypovolemic shock She had a history of alcoholic liver cirrhosis.  -Acute kidney injury -Elevated troponins : Due to demand ischemia from hypovolemic shock -Acute hypokalemia:  -Acute metabolic/hepatic encephalopathy: -Yeast UTI  -Hypernatremia   On comfort measures.  Continue IV morphine and titrate up as needed.  Likely has a few hours today's left.  CODE STATUS: DNR  TOTAL TIME TAKING CARE OF THIS PATIENT: 30 minutes.   [name] M.D on 10-04-2018 at 11:26 AM  Between 7am to 6pm - Pager - [phone]  After 6pm go to [email or web address] - password [email or web address] [facility]  Office  [phone]  CC: Primary care physician; Ranae Plumber, PA  Note: This dictation was prepared with Dragon dictation along with smaller phrase technology. Any transcriptional errors that result from this process are unintentional.

## 2018-09-28 DEATH — deceased

## 2018-10-28 NOTE — Death Summary Note (Signed)
DEATH SUMMARY   Patient Details  Name: Mackenzie Little MRN: [id number] DOB: [birth year]  Admission/Discharge Information   Admit Date:  2018-09-28  Date of Death: Date of Death: 10-18-18  Time of Death: Time of Death: 18-May-1745  Length of Stay: 05-13-2022  Referring Physician: Ranae Plumber, PA   Reason(s) for Hospitalization   GI bleed  Diagnoses  Preliminary cause of death:   alcoholic liver cirrhosis Secondary Diagnoses (including complications and co-morbidities):  Active Problems:   GI bleed   Cirrhosis of liver (HCC)   Lactic acidosis   Liver failure without hepatic coma (HCC)   End of life care   Goals of care, counseling/discussion   Palliative care by specialist   Brief Hospital Course (including significant findings, care, treatment, and services provided and events leading to death)  53 year old female patientwith a known history ofhypertension, alcohol abuse presented to the emergency room for generalized weakness.Patient has been fatigued for the last 3 days and noticed dark tarry stool for the last couple of days.  Patient was tired and fatigued and had labored breathing and was electively intubated and put on ventilator by intensivist.  -Hypovolemic shock and multiorgan failure-due to acute GI bleed -Initial labs with evidence of acute blood loss (Hgb 6.5), and elevated INR, elevated troponins, and evidence of AKI (Cr 2.47, GFR 25) Was treated with vasopressors, blood transfusion, FFP in the ICU..  -Acute respiratory failure with hypoxia -Multilobar pneumonia  Treated with IV antibiotics with full ventilatory support after intubation.  Cultures negative.  Due to poor prognosis patient was extubated and transitioned to comfort care in the ICU  -Acute gastrointestinal bleeding with acute blood loss anemia EGD shows with LA grade C esophagitis with no bleeding and severe portal hypertensive gastropathy.  No endoscopic evidence of varices.  1 nonbleeding cratered  duodenal ulcer with no stigmata of bleeding. Treated with IV PPI, blood transfusion and FFP.  Continued to worsen  -Shock liver over alcoholic liver cirrhosis Due to hypovolemic shock She had a history of alcoholic liver cirrhosis.  -Acute kidney injury -Elevated troponins: Due to demand ischemia from hypovolemic shock -Acute hypokalemia:  -Acute metabolic/hepatic encephalopathy: -Yeast UTI  -Hypernatremia  Patient was aggressively treated in the ICU but continued to deteriorate in spite of appropriate treatment.  Due to poor prognosis family discussion was held and palliative care consulted.  Patient was transitioned to comfort care on morphine drip and was pronounced dead on 2018/10/18 at 5:47 PM.   HISTORY OF PRESENT ILLNESS: Mackenzie Little  is a 53 y.o. female with a known history of hypertension, alcohol abuse presented to the emergency room for generalized weakness.  Patient has been fatigued for the last 3 days and noticed dark tarry stool for the last couple of days.  She also had an episode of vomiting blood.  She was evaluated in the emergency room hemoglobin on 6.5 INR was 2.3.  2 units PRBC transfusion have been started in the emergency room and FFP transfusion is being planned.  Patient blood pressure was low she received IV fluid bolus.  Her lactic acid level is also elevated.  No fever.  No recent travel.  COVID-19 test is pending.  Pertinent Labs and Studies  Significant Diagnostic Studies Dg Abd 1 View  Result Date: 09/19/2018 CLINICAL DATA:  OG tube placement EXAM: ABDOMEN - 1 VIEW COMPARISON:  None. FINDINGS: OG tube appears adequately position in the stomach. Visualized bowel gas pattern is nonobstructive. IMPRESSION: OG tube appears adequately position in the stomach.  Electronically Signed   By: Franki Cabot M.D.   On: 09/04/2018 18:55   Ct Head Wo Contrast  Result Date: 09/11/2018 CLINICAL DATA:  Altered level of consciousness, unexplained EXAM: CT HEAD WITHOUT  CONTRAST TECHNIQUE: Contiguous axial images were obtained from the base of the skull through the vertex without intravenous contrast. COMPARISON:  12/17/2009 FINDINGS: Brain: No evidence of acute infarction, hemorrhage, hydrocephalus, extra-axial collection or mass lesion/mass effect. Mild cerebral volume loss. Vascular: No hyperdense vessel or unexpected calcification. Skull: Normal. Negative for fracture or focal lesion. Heterogeneous bony density at the central skull base that is unchanged from prior and considered benign. Sinuses/Orbits: No acute finding. IMPRESSION: Stable head CT.  No acute finding. Electronically Signed   By: Monte Fantasia M.D.   On: 09/11/2018 11:21   Mr Brain Wo Contrast  Result Date: 09/17/2018 CLINICAL DATA:  Initial evaluation for acute encephalopathy, altered mental status, unexplained. EXAM: MRI HEAD WITHOUT CONTRAST TECHNIQUE: Multiplanar, multiecho pulse sequences of the brain and surrounding structures were obtained without intravenous contrast. COMPARISON:  Prior head CT from 09/11/2018. FINDINGS: Brain: Cerebral volume within normal limits for age. No significant cerebral white matter disease. Focus of encephalomalacia and gliosis within the mid left temporal region most likely reflects a small chronic ischemic infarct (series 13, image 14). Small amount of associated chronic hemosiderin staining. Patchy 8 mm focus of restricted diffusion seen involving the genu of the left internal capsule, early subacute in appearance. No associated hemorrhage or mass effect. No other evidence for acute or subacute ischemia. Gray-white matter differentiation otherwise maintained with no other evidence for chronic cortical infarction. No other evidence for acute or chronic intracranial hemorrhage. No mass lesion, midline shift or mass effect. Ventricles normal size without hydrocephalus. No extra-axial fluid collection. Vascular: Major intracranial vascular flow voids maintained. Skull and  upper cervical spine: Craniocervical junction within normal limits. Bone marrow signal intensity normal. No scalp soft tissue abnormality. Sinuses/Orbits: Globes and orbital soft tissues within normal limits. Mild scattered mucosal thickening within the maxillary sinuses bilaterally. Paranasal sinuses are otherwise clear. No mastoid effusion. Inner ear structures grossly normal. Other: None. IMPRESSION: 1. Patchy 8 mm early subacute ischemic infarct involving the left internal capsule. No associated hemorrhage. 2. Small chronic subcortical left temporal infarct as above. 3. Otherwise negative brain MRI for age. Electronically Signed   By: Jeannine Boga M.D.   On: 09/17/2018 22:56   Dg Chest Port 1 View  Result Date: 09/15/2018 CLINICAL DATA:  Respiratory failure. EXAM: PORTABLE CHEST 1 VIEW COMPARISON:  One-view chest x-ray 09/14/2018 FINDINGS: The endotracheal tube terminates at the carina and could be pulled back 2-3 cm for more optimal positioning. Bilateral interstitial and airspace disease is again seen, right greater than left. There is slight improvement. NG tube courses off the inferior border the film. Right-sided PICC line is stable. IMPRESSION: 1. Endotracheal tube remains at the level of the carina and could be pulled back 2-3 cm for more optimal positioning. 2. Slight improvement in persistent bilateral interstitial and airspace disease, right greater than left. Findings are most consistent with multifocal pneumonia. Electronically Signed   By: San Morelle M.D.   On: 09/15/2018 06:18   Dg Chest Port 1 View  Result Date: 09/14/2018 CLINICAL DATA:  Respiratory failure EXAM: PORTABLE CHEST 1 VIEW COMPARISON:  09/13/2018 FINDINGS: Cardiac shadow is enlarged but stable. Endotracheal tube is again seen at the level of the carina and could be withdrawn 1-2 cm. Right-sided PICC line and gastric catheter are seen.  The lungs are well aerated with patchy infiltrates right greater than left  stable from the prior exam. No new focal abnormality is noted. IMPRESSION: No change from the prior exam. The endotracheal tube is seen at the level of the carina and could be withdrawn 1-2 cm. Electronically Signed   By: Inez Catalina M.D.   On: 09/14/2018 07:07   Dg Chest Port 1 View  Result Date: 09/13/2018 CLINICAL DATA:  Respiratory failure EXAM: PORTABLE CHEST 1 VIEW COMPARISON:  09/12/2018 FINDINGS: Grossly unchanged borderline enlarged cardiac silhouette and mediastinal contours with persistent obscuration of right heart border secondary to heterogeneous airspace opacities. Stable positioning of support apparatus. No definite pneumothorax. Bilateral heterogeneous airspace opacities are unchanged. No new focal airspace opacities. No definite pleural effusion. No evidence of edema. No acute osseous abnormalities. IMPRESSION: 1.  Stable positioning of support apparatus.  No pneumothorax. 2. Grossly unchanged bibasilar heterogeneous airspace opacities, right greater than left, worrisome for multifocal infection, including atypical etiologies. Electronically Signed   By: Sandi Mariscal M.D.   On: 09/13/2018 07:53   Dg Chest Port 1 View  Result Date: 09/12/2018 CLINICAL DATA:  Respiratory failure EXAM: PORTABLE CHEST 1 VIEW COMPARISON:  Yesterday FINDINGS: Endotracheal tube tip is 2 cm above the carina. The orogastric tube at least reaches the stomach. PICC with tip at the SVC. Extensive bilateral airspace disease is unchanged. No pneumothorax. Stable cardiac enlargement IMPRESSION: No significant change in hardware positioning, low lung volumes, and extensive airspace disease. Electronically Signed   By: Monte Fantasia M.D.   On: 09/12/2018 07:48   Dg Chest Port 1 View  Result Date: 09/11/2018 CLINICAL DATA:  Respiratory failure EXAM: PORTABLE CHEST 1 VIEW COMPARISON:  Three days ago FINDINGS: Endotracheal tube tip is ~2 cm above the carina. Right PICC with tip at the SVC. The orogastric tube at least  reaches the stomach. Artifact from EKG leads. Extensive bilateral airspace disease. Cardiomegaly. Possible layering pleural effusions. No pneumothorax. IMPRESSION: 1. New hardware is in good position. 2. Extensive bilateral airspace disease likely a combination of edema and pneumonia. Electronically Signed   By: Monte Fantasia M.D.   On: 09/11/2018 07:23   Dg Chest Port 1 View  Result Date: 09/08/2018 CLINICAL DATA:  Respiratory failure EXAM: PORTABLE CHEST 1 VIEW COMPARISON:  Chest radiograph 09/07/2018 FINDINGS: ET tube terminates 1.2 cm above the level of the carina. Stable cardiomegaly and mediastinal contours. Prominent multifocal airspace disease throughout the right lung and within the mid to lower left lung again demonstrated. Slight interval increase in opacity at the right lung base. A small right pleural effusion is difficult to exclude. No evidence of pneumothorax. No acute bony abnormality. IMPRESSION: Unchanged ET tube position. Multifocal airspace disease again demonstrated with slight interval progression in the right lung base. A small right pleural effusion is difficult to exclude. Electronically Signed   By: Kellie Simmering   On: 09/08/2018 07:56   [place] Ekg Site Rite  Result Date: 09/09/2018 If Site Rite image not attached, placement could not be confirmed due to current cardiac rhythm.   Microbiology No results found for this or any previous visit (from the past 240 hour(s)).  Lab Basic Metabolic Panel: No results for input(s): NA, K, CL, CO2, GLUCOSE, BUN, CREATININE, CALCIUM, MG, PHOS in the last 168 hours. Liver Function Tests: No results for input(s): AST, ALT, ALKPHOS, BILITOT, PROT, ALBUMIN in the last 168 hours. No results for input(s): LIPASE, AMYLASE in the last 168 hours. No results for input(s): AMMONIA  in the last 168 hours. CBC: No results for input(s): WBC, NEUTROABS, HGB, HCT, MCV, PLT in the last 168 hours. Cardiac Enzymes: No results for input(s): CKTOTAL,  CKMB, CKMBINDEX, TROPONINI in the last 168 hours. Sepsis Labs: No results for input(s): PROCALCITON, WBC, LATICACIDVEN in the last 168 hours.  Procedures/Operations  EGD   [place] R Chaney Ingram 10/07/2018, 8:39 AM

## 2021-06-25 IMAGING — MR MRI HEAD WITHOUT CONTRAST
11 series · 48 of 48 positions shown · non-contrast
Comparison: Prior head CT from 09/11/2018.

CLINICAL DATA: Initial evaluation for acute encephalopathy, altered
mental status, unexplained.

EXAM:
MRI HEAD WITHOUT CONTRAST
TECHNIQUE: Multiplanar, multiecho pulse sequences of the brain and surrounding
structures were obtained without intravenous contrast.

[Series 3: ax dwi_tracew · axial · 3.0mm · 1.31mm/px · z∈[-46,+108]mm · 5 of 48 slices shown]
[im 1/48]
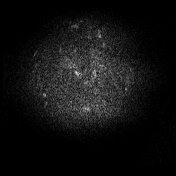
[im 12/48]
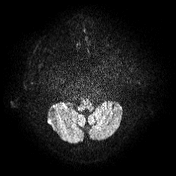
[im 24/48]
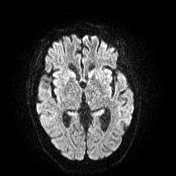
[im 36/48]
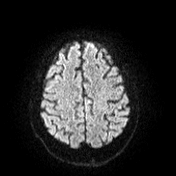
[im 48/48]
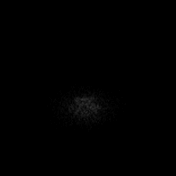

[Series 4: ax dwi_adc · axial · 3.0mm · 1.31mm/px · z∈[-46,+108]mm · 4 of 48 slices shown]
[im 1/48]
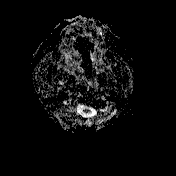
[im 16/48]
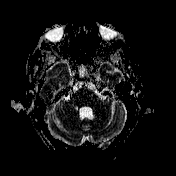
[im 32/48]
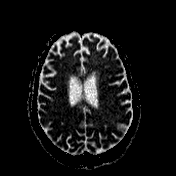
[im 48/48]
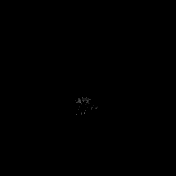

[Series 5: cor dwi_tracew · coronal · 5.0mm · 1.31mm/px · 3 of 38 slices shown]
[im 1/38]
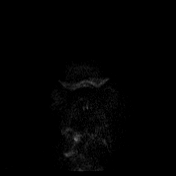
[im 19/38]
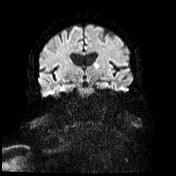
[im 38/38]
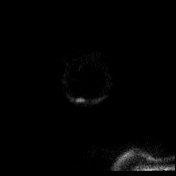

[Series 6: cor dwi_adc · coronal · 5.0mm · 1.31mm/px · 3 of 38 slices shown]
[im 1/38]
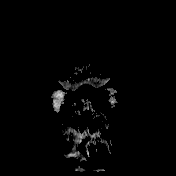
[im 19/38]
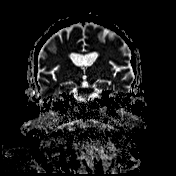
[im 38/38]
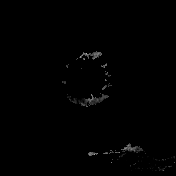

[Series 7: T1 · sagittal · 5.0mm · 0.94mm/px · 2 of 23 slices shown (1 of 2)]
[im 1/23]
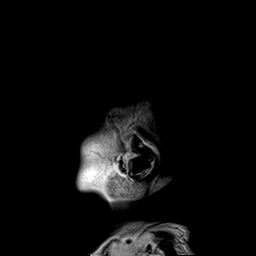
[im 23/23]
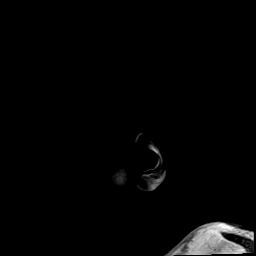

[Series 8: T2 · axial · 5.0mm · 0.45mm/px · z∈[-47,+109]mm · 2 of 27 slices shown (1 of 2)]
[im 1/27]
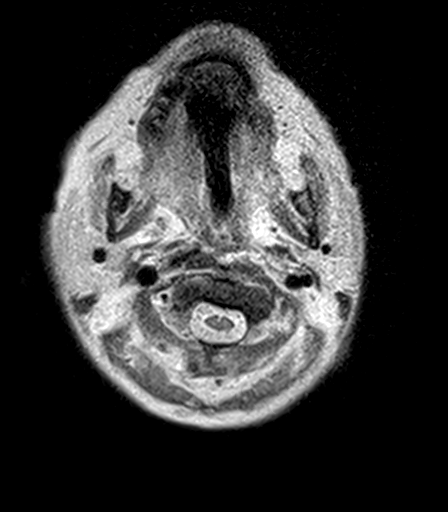
[im 27/27]
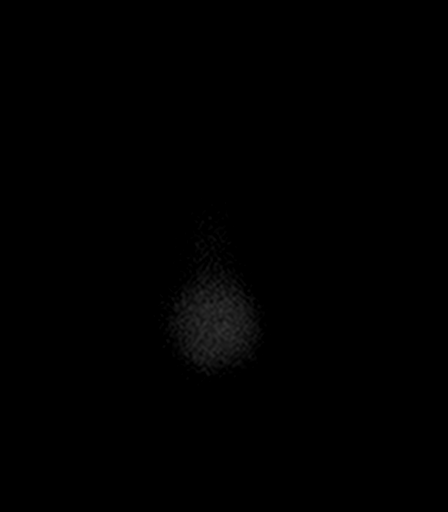

[Series 10: pha_images · axial · 3.0mm · 0.90mm/px · z∈[-48,+110]mm · 5 of 54 slices shown]
[im 1/54]
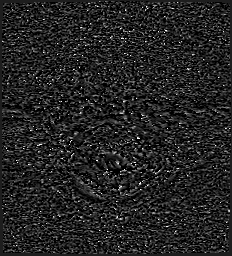
[im 14/54]
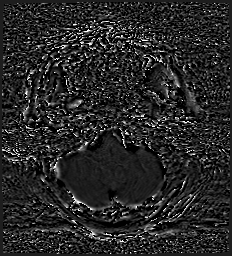
[im 27/54]
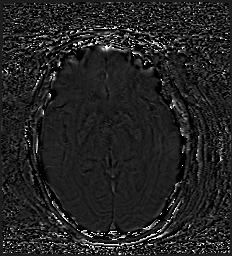
[im 40/54]
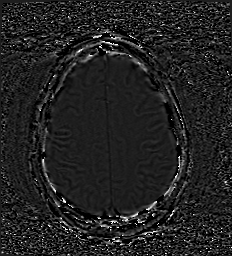
[im 54/54]
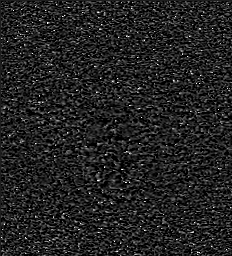

[Series 11: swi_images · axial · 3.0mm · 0.90mm/px · z∈[-51,+113]mm · 5 of 56 slices shown]
[im 1/56]
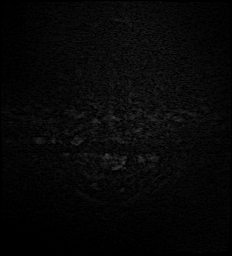
[im 14/56]
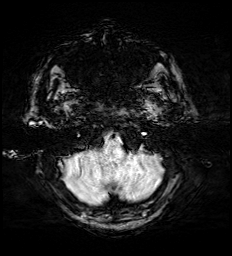
[im 28/56]
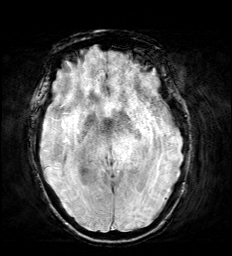
[im 42/56]
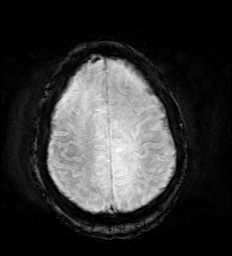
[im 56/56]
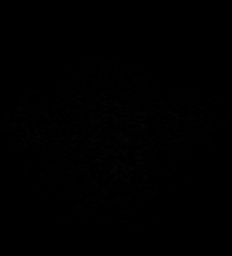

[Series 13: FLAIR · axial · 5.0mm · 1.20mm/px · z∈[-47,+108]mm · 2 of 27 slices shown]
[im 1/27]
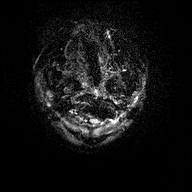
[im 27/27]
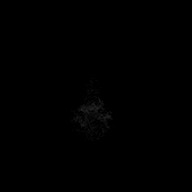

[Series 14: T1 · axial · 1.0mm · 0.98mm/px · z∈[-49,+109]mm · 14 of 160 slices shown (2 of 2)]
[im 1/160]
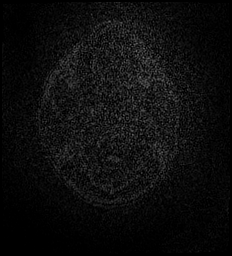
[im 13/160]
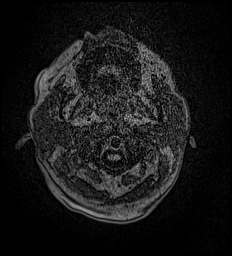
[im 25/160]
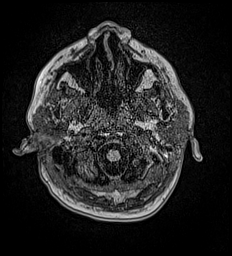
[im 37/160]
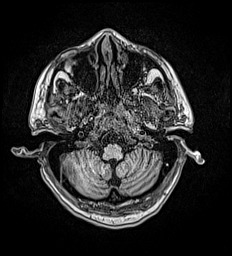
[im 49/160]
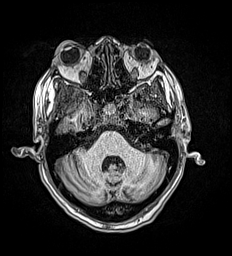
[im 62/160]
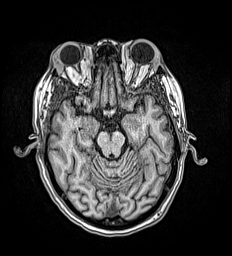
[im 74/160]
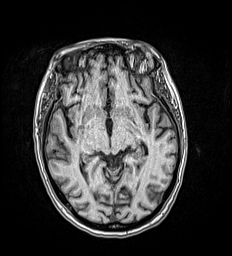
[im 86/160]
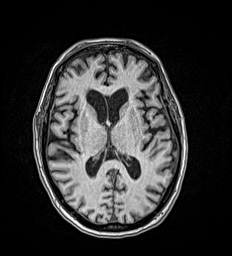
[im 98/160]
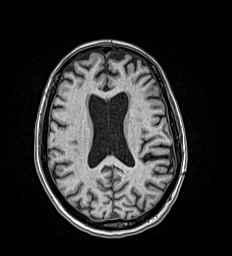
[im 111/160]
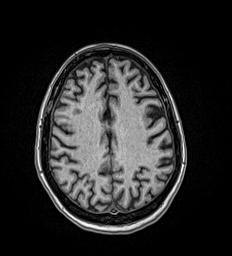
[im 123/160]
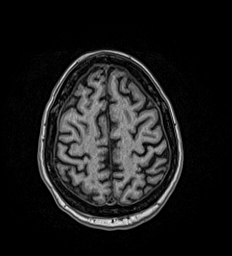
[im 135/160]
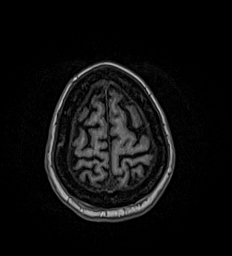
[im 147/160]
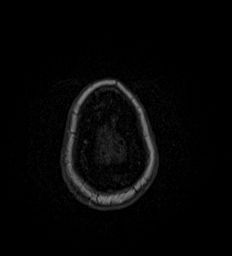
[im 160/160]
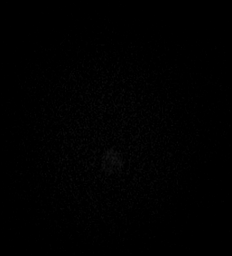

[Series 15: T2 · coronal · 5.0mm · 0.45mm/px · 3 of 31 slices shown (2 of 2)]
[im 1/31]
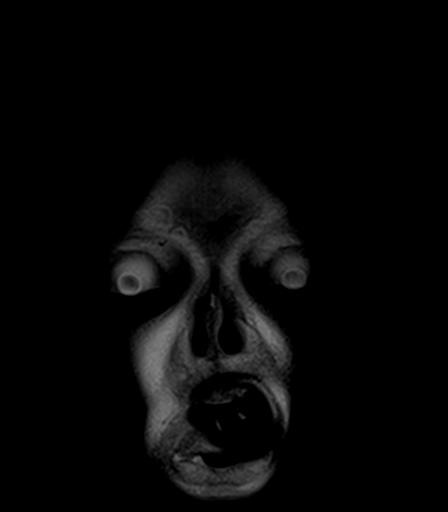
[im 16/31]
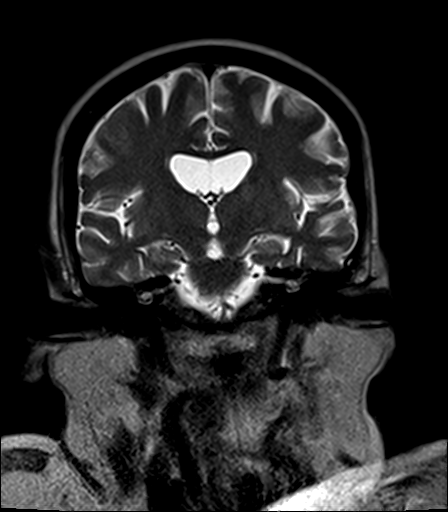
[im 31/31]
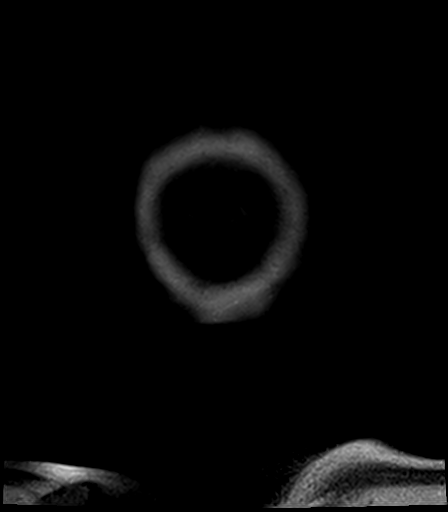

[48 of 48 positions shown; findings below may reference images not displayed]

FINDINGS: Brain: Cerebral volume within normal limits for age. No significant
cerebral white matter disease. Focus of encephalomalacia and gliosis
within the mid left temporal region most likely reflects a small
chronic ischemic infarct (series 13, image 14). Small amount of
associated chronic hemosiderin staining.

Patchy 8 mm focus of restricted diffusion seen involving the genu of
the left internal capsule, early subacute in appearance. No
associated hemorrhage or mass effect. No other evidence for acute or
subacute ischemia. Gray-white matter differentiation otherwise
maintained with no other evidence for chronic cortical infarction.
No other evidence for acute or chronic intracranial hemorrhage.

No mass lesion, midline shift or mass effect. Ventricles normal size
without hydrocephalus. No extra-axial fluid collection.

Vascular: Major intracranial vascular flow voids maintained.

Skull and upper cervical spine: Craniocervical junction within
normal limits. Bone marrow signal intensity normal. No scalp soft
tissue abnormality.

Sinuses/Orbits: Globes and orbital soft tissues within normal
limits. Mild scattered mucosal thickening within the maxillary
sinuses bilaterally. Paranasal sinuses are otherwise clear. No
mastoid effusion. Inner ear structures grossly normal.

Other: None.
IMPRESSION: 1. Patchy 8 mm early subacute ischemic infarct involving the left
internal capsule. No associated hemorrhage.
2. Small chronic subcortical left temporal infarct as above.
3. Otherwise negative brain MRI for age.
